# Patient Record
Sex: Female | Born: 1960 | Race: White | Hispanic: No | Marital: Single | Smoking: Former smoker
Health system: Southern US, Community
[De-identification: ages and names within clinical notes are randomized; demographics above are authoritative.]

## PROBLEM LIST (undated history)

## (undated) DIAGNOSIS — Z972 Presence of dental prosthetic device (complete) (partial): Secondary | ICD-10-CM

## (undated) DIAGNOSIS — K219 Gastro-esophageal reflux disease without esophagitis: Secondary | ICD-10-CM

## (undated) DIAGNOSIS — J449 Chronic obstructive pulmonary disease, unspecified: Secondary | ICD-10-CM

## (undated) DIAGNOSIS — Z9289 Personal history of other medical treatment: Secondary | ICD-10-CM

## (undated) DIAGNOSIS — R079 Chest pain, unspecified: Secondary | ICD-10-CM

## (undated) DIAGNOSIS — M810 Age-related osteoporosis without current pathological fracture: Secondary | ICD-10-CM

## (undated) DIAGNOSIS — M1990 Unspecified osteoarthritis, unspecified site: Secondary | ICD-10-CM

## (undated) DIAGNOSIS — J45909 Unspecified asthma, uncomplicated: Secondary | ICD-10-CM

## (undated) DIAGNOSIS — I480 Paroxysmal atrial fibrillation: Secondary | ICD-10-CM

## (undated) DIAGNOSIS — F1090 Alcohol use, unspecified, uncomplicated: Secondary | ICD-10-CM

## (undated) DIAGNOSIS — Z789 Other specified health status: Secondary | ICD-10-CM

## (undated) DIAGNOSIS — J439 Emphysema, unspecified: Secondary | ICD-10-CM

## (undated) HISTORY — DX: Age-related osteoporosis without current pathological fracture: M81.0

## (undated) HISTORY — PX: MULTIPLE TOOTH EXTRACTIONS: SHX2053

## (undated) HISTORY — DX: Personal history of other medical treatment: Z92.89

## (undated) HISTORY — DX: Chest pain, unspecified: R07.9

## (undated) HISTORY — DX: Paroxysmal atrial fibrillation: I48.0

---

## 2005-05-01 ENCOUNTER — Emergency Department: Payer: Self-pay | Admitting: Emergency Medicine

## 2006-11-24 ENCOUNTER — Emergency Department: Payer: Self-pay | Admitting: Internal Medicine

## 2007-04-09 ENCOUNTER — Emergency Department: Payer: Self-pay | Admitting: Emergency Medicine

## 2009-07-07 ENCOUNTER — Emergency Department: Payer: Self-pay | Admitting: Emergency Medicine

## 2010-07-01 ENCOUNTER — Emergency Department: Payer: Self-pay | Admitting: Emergency Medicine

## 2010-10-03 ENCOUNTER — Emergency Department: Payer: Self-pay | Admitting: Emergency Medicine

## 2013-02-11 ENCOUNTER — Emergency Department: Payer: Self-pay | Admitting: Internal Medicine

## 2013-03-07 ENCOUNTER — Emergency Department: Payer: Self-pay | Admitting: Emergency Medicine

## 2013-10-29 ENCOUNTER — Emergency Department: Payer: Self-pay | Admitting: Emergency Medicine

## 2014-03-14 ENCOUNTER — Emergency Department: Payer: Self-pay | Admitting: Emergency Medicine

## 2014-03-14 LAB — CBC WITH DIFFERENTIAL/PLATELET
BASOS PCT: 2.3 %
Basophil #: 0.1 10*3/uL (ref 0.0–0.1)
Eosinophil #: 0.3 10*3/uL (ref 0.0–0.7)
Eosinophil %: 6.7 %
HCT: 40.9 % (ref 35.0–47.0)
HGB: 13.5 g/dL (ref 12.0–16.0)
Lymphocyte #: 1.7 10*3/uL (ref 1.0–3.6)
Lymphocyte %: 34.4 %
MCH: 31.8 pg (ref 26.0–34.0)
MCHC: 33 g/dL (ref 32.0–36.0)
MCV: 96 fL (ref 80–100)
Monocyte #: 0.5 x10 3/mm (ref 0.2–0.9)
Monocyte %: 9 %
NEUTROS PCT: 47.6 %
Neutrophil #: 2.4 10*3/uL (ref 1.4–6.5)
PLATELETS: 224 10*3/uL (ref 150–440)
RBC: 4.24 10*6/uL (ref 3.80–5.20)
RDW: 11.9 % (ref 11.5–14.5)
WBC: 5 10*3/uL (ref 3.6–11.0)

## 2014-03-14 LAB — COMPREHENSIVE METABOLIC PANEL
ALK PHOS: 129 U/L — AB
ALT: 30 U/L
ANION GAP: 7 (ref 7–16)
Albumin: 3.9 g/dL (ref 3.4–5.0)
BUN: 11 mg/dL (ref 7–18)
Bilirubin,Total: 0.3 mg/dL (ref 0.2–1.0)
CHLORIDE: 109 mmol/L — AB (ref 98–107)
CO2: 26 mmol/L (ref 21–32)
Calcium, Total: 8.9 mg/dL (ref 8.5–10.1)
Creatinine: 0.45 mg/dL — ABNORMAL LOW (ref 0.60–1.30)
Glucose: 75 mg/dL (ref 65–99)
Osmolality: 281 (ref 275–301)
POTASSIUM: 4.1 mmol/L (ref 3.5–5.1)
SGOT(AST): 34 U/L (ref 15–37)
SODIUM: 142 mmol/L (ref 136–145)
Total Protein: 7 g/dL (ref 6.4–8.2)

## 2014-03-14 LAB — URINALYSIS, COMPLETE
BACTERIA: NONE SEEN
Bilirubin,UR: NEGATIVE
GLUCOSE, UR: NEGATIVE mg/dL (ref 0–75)
KETONE: NEGATIVE
Nitrite: NEGATIVE
PH: 5 (ref 4.5–8.0)
Protein: NEGATIVE
RBC,UR: 29 /HPF (ref 0–5)
SPECIFIC GRAVITY: 1.013 (ref 1.003–1.030)

## 2015-03-06 ENCOUNTER — Encounter: Payer: Self-pay | Admitting: Emergency Medicine

## 2015-03-06 ENCOUNTER — Emergency Department
Admission: EM | Admit: 2015-03-06 | Discharge: 2015-03-06 | Disposition: A | Discharge status: Home / Self Care | Payer: Self-pay | Attending: Emergency Medicine | Admitting: Emergency Medicine

## 2015-03-06 DIAGNOSIS — R112 Nausea with vomiting, unspecified: Secondary | ICD-10-CM | POA: Insufficient documentation

## 2015-03-06 DIAGNOSIS — R1013 Epigastric pain: Secondary | ICD-10-CM | POA: Insufficient documentation

## 2015-03-06 DIAGNOSIS — Z87891 Personal history of nicotine dependence: Secondary | ICD-10-CM | POA: Insufficient documentation

## 2015-03-06 DIAGNOSIS — R42 Dizziness and giddiness: Secondary | ICD-10-CM | POA: Insufficient documentation

## 2015-03-06 HISTORY — DX: Unspecified asthma, uncomplicated: J45.909

## 2015-03-06 LAB — CBC
HCT: 39.9 % (ref 35.0–47.0)
HEMOGLOBIN: 13.7 g/dL (ref 12.0–16.0)
MCH: 32.7 pg (ref 26.0–34.0)
MCHC: 34.5 g/dL (ref 32.0–36.0)
MCV: 94.8 fL (ref 80.0–100.0)
PLATELETS: 272 10*3/uL (ref 150–440)
RBC: 4.21 MIL/uL (ref 3.80–5.20)
RDW: 12.2 % (ref 11.5–14.5)
WBC: 10.9 10*3/uL (ref 3.6–11.0)

## 2015-03-06 LAB — URINALYSIS COMPLETE WITH MICROSCOPIC (ARMC ONLY)
BILIRUBIN URINE: NEGATIVE
Bacteria, UA: NONE SEEN
Glucose, UA: NEGATIVE mg/dL
Hgb urine dipstick: NEGATIVE
KETONES UR: NEGATIVE mg/dL
LEUKOCYTES UA: NEGATIVE
NITRITE: NEGATIVE
PH: 5 (ref 5.0–8.0)
Protein, ur: NEGATIVE mg/dL
RBC / HPF: NONE SEEN RBC/hpf (ref 0–5)
SPECIFIC GRAVITY, URINE: 1.005 (ref 1.005–1.030)
Squamous Epithelial / LPF: NONE SEEN
WBC UA: NONE SEEN WBC/hpf (ref 0–5)

## 2015-03-06 LAB — LIPASE, BLOOD: Lipase: 26 U/L (ref 22–51)

## 2015-03-06 LAB — COMPREHENSIVE METABOLIC PANEL
ALK PHOS: 114 U/L (ref 38–126)
ALT: 22 U/L (ref 14–54)
ANION GAP: 10 (ref 5–15)
AST: 22 U/L (ref 15–41)
Albumin: 4 g/dL (ref 3.5–5.0)
BILIRUBIN TOTAL: 0.3 mg/dL (ref 0.3–1.2)
BUN: 17 mg/dL (ref 6–20)
CALCIUM: 9 mg/dL (ref 8.9–10.3)
CO2: 26 mmol/L (ref 22–32)
CREATININE: 0.59 mg/dL (ref 0.44–1.00)
Chloride: 101 mmol/L (ref 101–111)
Glucose, Bld: 110 mg/dL — ABNORMAL HIGH (ref 65–99)
Potassium: 3.8 mmol/L (ref 3.5–5.1)
SODIUM: 137 mmol/L (ref 135–145)
TOTAL PROTEIN: 6.7 g/dL (ref 6.5–8.1)

## 2015-03-06 LAB — TROPONIN I: Troponin I: <0.03 ng/mL (ref <0.031)

## 2015-03-06 MED ORDER — RANITIDINE HCL 150 MG PO TABS: 150.0000 mg | ORAL_TABLET | Freq: Every day | ORAL | Status: DC

## 2015-03-06 MED ORDER — GI COCKTAIL ~~LOC~~
30.0000 mL | Freq: Once | ORAL | Status: AC
  Administered 2015-03-06: 30 mL via ORAL
  Filled 2015-03-06: qty 30

## 2015-03-06 MED ORDER — ONDANSETRON HCL 4 MG PO TABS: 4.0000 mg | ORAL_TABLET | Freq: Every day | ORAL | Status: DC | PRN

## 2015-03-06 MED ORDER — FAMOTIDINE 20 MG PO TABS
20.0000 mg | ORAL_TABLET | Freq: Once | ORAL | Status: AC
  Administered 2015-03-06: 20 mg via ORAL
  Filled 2015-03-06: qty 1

## 2015-03-06 NOTE — ED Provider Notes (Addendum)
[facility] Emergency Department Provider Note     Time seen: ----------------------------------------- 2:41 PM on 03/06/2015 -----------------------------------------    I have reviewed the triage vital signs and the nursing notes.   HISTORY  Chief Complaint Hypertension    HPI Jill King is a 54 y.o. female who presents ER stating she was feeling lightheaded having upper abdominal pain and vomiting this morning.Patient had sudden onset of vomiting while she was at work, had upper abdominal pain and felt lightheaded. Blood pressure was noted to be elevated, was encouraged to come to ER by EMS.   Past Medical History  Diagnosis Date  . Asthma     There are no active problems to display for this patient.   Past Surgical History  Procedure Laterality Date  . Cesarean section      Allergies Review of patient's allergies indicates no known allergies.  Social History Social History  Substance Use Topics  . Smoking status: Former Research scientist (life sciences)  . Smokeless tobacco: None  . Alcohol Use: Yes    Review of Systems Constitutional: Negative for fever. Eyes: Negative for visual changes. ENT: Negative for sore throat. Cardiovascular: Negative for chest pain. Respiratory: Negative for shortness of breath. Gastrointestinal: Positive for abdominal pain and vomiting, negative for diarrhea Genitourinary: Negative for dysuria. Musculoskeletal: Negative for back pain. Skin: Negative for rash. Neurological: Negative for headaches, focal weakness or numbness.  10-point ROS otherwise negative.  ____________________________________________   PHYSICAL EXAM:  VITAL SIGNS: ED Triage Vitals  Enc Vitals Group     BP 03/06/15 1151 151/84 mmHg     Pulse Rate 03/06/15 1151 67     Resp 03/06/15 1151 16     Temp 03/06/15 1151 98 F (36.7 C)     Temp Source 03/06/15 1151 Oral     SpO2 03/06/15 1151 100 %     Weight 03/06/15 1151 115 lb (52.164 kg)   Height 03/06/15 1151 5\' 5"  (1.651 m)     Head Cir --      Peak Flow --      Pain Score 03/06/15 1152 7     Pain Loc --      Pain Edu? --      Excl. in [place]? --     Constitutional: Alert and oriented. Well appearing and in no distress. Eyes: Conjunctivae are normal. PERRL. Normal extraocular movements. ENT   Head: Normocephalic and atraumatic.   Nose: No congestion/rhinnorhea.   Mouth/Throat: Mucous membranes are moist.   Neck: No stridor. Cardiovascular: Normal rate, regular rhythm. Normal and symmetric distal pulses are present in all extremities. No murmurs, rubs, or gallops. Respiratory: Normal respiratory effort without tachypnea nor retractions. Breath sounds are clear and equal bilaterally. No wheezes/rales/rhonchi. Gastrointestinal: Soft and nontender. No distention. No abdominal bruits.  Musculoskeletal: Nontender with normal range of motion in all extremities. No joint effusions.  No lower extremity tenderness nor edema. Neurologic:  Normal speech and language. No gross focal neurologic deficits are appreciated. Speech is normal. No gait instability. Skin:  Skin is warm, dry and intact. No rash noted. Psychiatric: Mood and affect are normal. Speech and behavior are normal. Patient exhibits appropriate insight and judgment. ____________________________________________  EKG: Interpreted by me. Normal sinus rhythm with rate of 63 bpm, normal axis normal intervals. No evidence of hypertrophy or acute infarction.  ____________________________________________  ED COURSE:  Pertinent labs & imaging results that were available during my care of the patient were reviewed by me and considered in my medical decision  making (see chart for details). Patient will need abdominal labs, antacids and reevaluation. ____________________________________________    LABS (pertinent positives/negatives)  Labs Reviewed  COMPREHENSIVE METABOLIC PANEL - Abnormal; Notable for the  following:    Glucose, Bld 110 (*)    All other components within normal limits  LIPASE, BLOOD  CBC  TROPONIN I  URINALYSIS COMPLETEWITH MICROSCOPIC ([facility] ONLY)   ____________________________________________  FINAL ASSESSMENT AND PLAN  Epigastric pain  Plan: Patient with labs and imaging as dictated above. Unclear etiology for symptoms. Likely GERD or gastritis. She is feeling better after medications given here including GI cocktail and Pepcid. She is no longer hypertensive. Labs are unremarkable, she stable for outpatient follow-up   Earleen Newport, MD   Earleen Newport, MD 03/06/15 [place], MD 03/06/15 [phone]

## 2015-03-06 NOTE — ED Notes (Signed)
Patient reports that she was feeling light headed, having upper abdominal pain, and vomiting this morning.  EMS came to her work and reported her BP was up; 910 systolic.  Has not vomited again recently.

## 2015-03-06 NOTE — Discharge Instructions (Signed)
Nausea and Vomiting °Nausea is a sick feeling that often comes before throwing up (vomiting). Vomiting is a reflex where stomach contents come out of your mouth. Vomiting can cause severe loss of body fluids (dehydration). Children and elderly adults can become dehydrated quickly, especially if they also have diarrhea. Nausea and vomiting are symptoms of a condition or disease. It is important to find the cause of your symptoms. °CAUSES  °· Direct irritation of the stomach lining. This irritation can result from increased acid production (gastroesophageal reflux disease), infection, food poisoning, taking certain medicines (such as nonsteroidal anti-inflammatory drugs), alcohol use, or tobacco use. °· Signals from the brain. These signals could be caused by a headache, heat exposure, an inner ear disturbance, increased pressure in the brain from injury, infection, a tumor, or a concussion, pain, emotional stimulus, or metabolic problems. °· An obstruction in the gastrointestinal tract (bowel obstruction). °· Illnesses such as diabetes, hepatitis, gallbladder problems, appendicitis, kidney problems, cancer, sepsis, atypical symptoms of a heart attack, or eating disorders. °· Medical treatments such as chemotherapy and radiation. °· Receiving medicine that makes you sleep (general anesthetic) during surgery. °DIAGNOSIS °Your caregiver may ask for tests to be done if the problems do not improve after a few days. Tests may also be done if symptoms are severe or if the reason for the nausea and vomiting is not clear. Tests may include: °· Urine tests. °· Blood tests. °· Stool tests. °· Cultures (to look for evidence of infection). °· X-rays or other imaging studies. °Test results can help your caregiver make decisions about treatment or the need for additional tests. °TREATMENT °You need to stay well hydrated. Drink frequently but in small amounts. You may wish to drink water, sports drinks, clear broth, or eat frozen  ice pops or gelatin dessert to help stay hydrated. When you eat, eating slowly may help prevent nausea. There are also some antinausea medicines that may help prevent nausea. °HOME CARE INSTRUCTIONS  °· Take all medicine as directed by your caregiver. °· If you do not have an appetite, do not force yourself to eat. However, you must continue to drink fluids. °· If you have an appetite, eat a normal diet unless your caregiver tells you differently. °¨ Eat a variety of complex carbohydrates (rice, wheat, potatoes, bread), lean meats, yogurt, fruits, and vegetables. °¨ Avoid high-fat foods because they are more difficult to digest. °· Drink enough water and fluids to keep your urine clear or pale yellow. °· If you are dehydrated, ask your caregiver for specific rehydration instructions. Signs of dehydration may include: °¨ Severe thirst. °¨ Dry lips and mouth. °¨ Dizziness. °¨ Dark urine. °¨ Decreasing urine frequency and amount. °¨ Confusion. °¨ Rapid breathing or pulse. °SEEK IMMEDIATE MEDICAL CARE IF:  °· You have blood or brown flecks (like coffee grounds) in your vomit. °· You have black or bloody stools. °· You have a severe headache or stiff neck. °· You are confused. °· You have severe abdominal pain. °· You have chest pain or trouble breathing. °· You do not urinate at least once every 8 hours. °· You develop cold or clammy skin. °· You continue to vomit for longer than 24 to 48 hours. °· You have a fever. °MAKE SURE YOU:  °· Understand these instructions. °· Will watch your condition. °· Will get help right away if you are not doing well or get worse. °Document Released: 06/14/2005 Document Revised: 09/06/2011 Document Reviewed: 11/11/2010 °[employer]® Patient Information ©2015 [employer]. This information is not intended   to replace advice given to you by your health care provider. Make sure you discuss any questions you have with your health care provider. Gastroesophageal Reflux Disease,  Adult Gastroesophageal reflux disease (GERD) happens when acid from your stomach flows up into the esophagus. When acid comes in contact with the esophagus, the acid causes soreness (inflammation) in the esophagus. Over time, GERD may create small holes (ulcers) in the lining of the esophagus. CAUSES   Increased body weight. This puts pressure on the stomach, making acid rise from the stomach into the esophagus.  Smoking. This increases acid production in the stomach.  Drinking alcohol. This causes decreased pressure in the lower esophageal sphincter (valve or ring of muscle between the esophagus and stomach), allowing acid from the stomach into the esophagus.  Late evening meals and a full stomach. This increases pressure and acid production in the stomach.  A malformed lower esophageal sphincter. Sometimes, no cause is found. SYMPTOMS   Burning pain in the lower part of the mid-chest behind the breastbone and in the mid-stomach area. This may occur twice a week or more often.  Trouble swallowing.  Sore throat.  Dry cough.  Asthma-like symptoms including chest tightness, shortness of breath, or wheezing. DIAGNOSIS  Your caregiver may be able to diagnose GERD based on your symptoms. In some cases, X-rays and other tests may be done to check for complications or to check the condition of your stomach and esophagus. TREATMENT  Your caregiver may recommend over-the-counter or prescription medicines to help decrease acid production. Ask your caregiver before starting or adding any new medicines.  HOME CARE INSTRUCTIONS   Change the factors that you can control. Ask your caregiver for guidance concerning weight loss, quitting smoking, and alcohol consumption.  Avoid foods and drinks that make your symptoms worse, such as:  Caffeine or alcoholic drinks.  Chocolate.  Peppermint or mint flavorings.  Garlic and onions.  Spicy foods.  Citrus fruits, such as oranges, lemons, or  limes.  Tomato-based foods such as sauce, chili, salsa, and pizza.  Fried and fatty foods.  Avoid lying down for the 3 hours prior to your bedtime or prior to taking a nap.  Eat small, frequent meals instead of large meals.  Wear loose-fitting clothing. Do not wear anything tight around your waist that causes pressure on your stomach.  Raise the head of your bed 6 to 8 inches with wood blocks to help you sleep. Extra pillows will not help.  Only take over-the-counter or prescription medicines for pain, discomfort, or fever as directed by your caregiver.  Do not take aspirin, ibuprofen, or other nonsteroidal anti-inflammatory drugs (NSAIDs). SEEK IMMEDIATE MEDICAL CARE IF:   You have pain in your arms, neck, jaw, teeth, or back.  Your pain increases or changes in intensity or duration.  You develop nausea, vomiting, or sweating (diaphoresis).  You develop shortness of breath, or you faint.  Your vomit is green, yellow, black, or looks like coffee grounds or blood.  Your stool is red, bloody, or black. These symptoms could be signs of other problems, such as heart disease, gastric bleeding, or esophageal bleeding. MAKE SURE YOU:   Understand these instructions.  Will watch your condition.  Will get help right away if you are not doing well or get worse. Document Released: 03/24/2005 Document Revised: 09/06/2011 Document Reviewed: 01/01/2011 [facility] Patient Information 2015 [place], [place]. This information is not intended to replace advice given to you by your health care provider. Make sure you discuss  any questions you have with your health care provider. ° °

## 2015-05-23 ENCOUNTER — Ambulatory Visit
Admission: EM | Admit: 2015-05-23 | Discharge: 2015-05-23 | Disposition: A | Discharge status: Home / Self Care | Payer: Self-pay | Attending: Family Medicine | Admitting: Family Medicine

## 2015-05-23 ENCOUNTER — Ambulatory Visit (INDEPENDENT_AMBULATORY_CARE_PROVIDER_SITE_OTHER): Payer: Self-pay

## 2015-05-23 ENCOUNTER — Encounter: Payer: Self-pay | Admitting: Emergency Medicine

## 2015-05-23 DIAGNOSIS — J441 Chronic obstructive pulmonary disease with (acute) exacerbation: Secondary | ICD-10-CM

## 2015-05-23 DIAGNOSIS — J189 Pneumonia, unspecified organism: Secondary | ICD-10-CM

## 2015-05-23 MED ORDER — LEVOFLOXACIN 500 MG PO TABS: 500.0000 mg | ORAL_TABLET | Freq: Every day | ORAL | Status: DC

## 2015-05-23 MED ORDER — PREDNISONE 10 MG (21) PO TBPK: ORAL_TABLET | ORAL | Status: DC

## 2015-05-23 MED ORDER — HYDROCOD POLST-CPM POLST ER 10-8 MG/5ML PO SUER: 5.0000 mL | Freq: Two times a day (BID) | ORAL | Status: DC | PRN

## 2015-05-23 MED ORDER — ALBUTEROL SULFATE HFA 108 (90 BASE) MCG/ACT IN AERS: 2.0000 | INHALATION_SPRAY | RESPIRATORY_TRACT | Status: DC | PRN

## 2015-05-23 NOTE — ED Provider Notes (Signed)
CSN: [id number]     Arrival date & time 05/23/15  1042 History   First MD Initiated Contact with Patient 05/23/15 1133    Nurses notes were reviewed.  Chief Complaint  Patient presents with  . Cough   Patient reports coughing now for 2 weeks. She states the last few days and cough and brownish mucus. Denies having history of COPD but has had bronchospastic attacks before. 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 Patient states she does not smoke but the reality is that she stop smoking 6 months ago. She has been smoking off and on until then and restarted after stopping for a while recently. She states that what she is coughing up seems like this would be you what a heavy smoker be coughing up. (Consider location/radiation/quality/duration/timing/severity/associated sxs/prior Treatment) Patient is a 54 y.o. female presenting with cough. The history is provided by the patient. No language interpreter was used.  Cough Cough characteristics:  Productive and hoarse Sputum characteristics:  Brown, yellow and rusty Severity:  Moderate Onset quality:  Sudden Duration:  2 weeks Timing:  Constant Progression:  Worsening Chronicity:  New Smoker: yes (Former smoker.)   Context: smoke exposure and upper respiratory infection   Context: not animal exposure, not exposure to allergens, not fumes, not occupational exposure, not sick contacts and not weather changes   Relieved by:  Nothing Ineffective treatments:  None tried Associated symptoms: chest pain, rhinorrhea and shortness of breath   Associated symptoms: no chills, no diaphoresis, no ear fullness, no fever, no headaches, no myalgias, no rash, no sinus congestion, no sore throat, no weight loss and no wheezing   Rhinorrhea:    Rhinorrhea appearance: She's had nasal  congestion about 2 weeks ago earlier and has gotten better while the lungs are gotten worse. Risk factors: recent infection   Risk factors: no chemical exposure and no recent travel     Past Medical History  Diagnosis Date  . Asthma    Past Surgical History  Procedure Laterality Date  . Cesarean section     History reviewed. No pertinent family history. Social History  Substance Use Topics  . Smoking status: Former Research scientist (life sciences)  . Smokeless tobacco: None  . Alcohol Use: Yes   OB History    No data available     Review of Systems  Constitutional: Negative for fever, chills, weight loss and diaphoresis.  HENT: Positive for rhinorrhea. Negative for sore throat.   Respiratory: Positive for cough and shortness of breath. Negative for wheezing.   Cardiovascular: Positive for chest pain.  Musculoskeletal: Negative for myalgias.  Skin: Negative for rash.  Neurological: Negative for headaches.  All other systems reviewed and are negative.   Allergies  Review of patient's allergies indicates no known allergies.  Home Medications   Prior to Admission medications   Medication Sig Start Date End Date Taking? Authorizing Provider  dextromethorphan-guaiFENesin (MUCINEX DM) 30-600 MG 12hr tablet Take 1 tablet by mouth 2 (two) times daily.   Yes Historical Provider, MD  albuterol (PROVENTIL HFA;VENTOLIN HFA) 108 (90 BASE) MCG/ACT inhaler Inhale 2 puffs into the lungs every 4 (four) hours as needed for wheezing or shortness of breath. 05/23/15   Frederich Cha, MD  chlorpheniramine-HYDROcodone [facility] PENNKINETIC ER) 10-8 MG/5ML SUER Take 5 mLs by mouth every 12 (twelve) hours as  needed for cough. 05/23/15   Frederich Cha, MD  levofloxacin (LEVAQUIN) 500 MG tablet Take 1 tablet (500 mg total) by mouth daily. 05/23/15   Frederich Cha, MD  ondansetron (ZOFRAN) 4 MG tablet Take 1 tablet (4 mg total) by mouth daily as needed for nausea or vomiting. 03/06/15   Earleen Newport, MD  predniSONE  (STERAPRED UNI-PAK 21 TAB) 10 MG (21) TBPK tablet Sig 6 tablet day 1, 5 tablets day 2, 4 tablets day 3,,3tablets day 4, 2 tablets day 5, 1 tablet day 6 take all tablets orally 05/23/15   Frederich Cha, MD  ranitidine (ZANTAC) 150 MG tablet Take 1 tablet (150 mg total) by mouth at bedtime. 03/06/15 03/05/16  Earleen Newport, MD   Meds Ordered and Administered this Visit  Medications - No data to display  BP 120/101 mmHg  Pulse 94  Temp(Src) 98.6 F (37 C) (Tympanic)  Resp 18  Ht 5\' 5"  (1.651 m)  Wt 115 lb (52.164 kg)  BMI 19.14 kg/m2  SpO2 97%  LMP  No data found.   Physical Exam  Constitutional: She is oriented to person, place, and time. She appears well-developed and well-nourished.  HENT:  Head: Normocephalic and atraumatic.  Right Ear: External ear normal.  Left Ear: External ear normal.  Neck: Normal range of motion. Neck supple.  Cardiovascular: Normal rate, regular rhythm and normal heart sounds.   Pulmonary/Chest: Effort normal and breath sounds normal. No respiratory distress. She has no wheezes.  Musculoskeletal: Normal range of motion. She exhibits no edema.  Neurological: She is alert and oriented to person, place, and time.  Skin: Skin is warm and dry.  Psychiatric: She has a normal mood and affect.  Vitals reviewed.   ED Course  Procedures (including critical care time)  Labs Review Labs Reviewed - No data to display  Imaging Review Dg Chest 2 View  05/23/2015  CLINICAL DATA:  Cough and fever EXAM: CHEST  2 VIEW COMPARISON:  11/24/2006 FINDINGS: COPD with hyperinflation Right middle lobe airspace disease has developed since the prior study and is most consistent with pneumonia. No pleural effusion. Apical scarring bilaterally.  Heart size and vascularity normal. IMPRESSION: COPD with right middle lobe infiltrate consistent with pneumonia. Electronically Signed   By: Franchot Gallo M.D.   On: 05/23/2015 12:33     Visual Acuity Review  Right Eye Distance:    Left Eye Distance:   Bilateral Distance:    Right Eye Near:   Left Eye Near:    Bilateral Near:         MDM   1. Community acquired pneumonia   2. COPD exacerbation ([place])       Because of the presence of major quadrant pneumonia we'll place on Levaquin 500 mg 1 tablet day also add prednisone because of her pulse ox only 97. Albuterol inhaler Tussionex for cough and a work note for today and tomorrow. Recommend following up with her PCP in V4 weeks for repeat chest x-ray if needed and for reevaluation.     Frederich Cha, MD 05/23/15 [phone]

## 2015-05-23 NOTE — Discharge Instructions (Signed)

## 2015-05-23 NOTE — ED Notes (Signed)
Cough (thick and brown phlegm) bodyaches, fever  for 2 weeks.

## 2015-06-26 ENCOUNTER — Ambulatory Visit
Admission: EM | Admit: 2015-06-26 | Discharge: 2015-06-26 | Disposition: A | Discharge status: Home / Self Care | Payer: Self-pay | Attending: Family Medicine | Admitting: Family Medicine

## 2015-06-26 ENCOUNTER — Ambulatory Visit (INDEPENDENT_AMBULATORY_CARE_PROVIDER_SITE_OTHER): Payer: Self-pay

## 2015-06-26 ENCOUNTER — Ambulatory Visit: Payer: Self-pay

## 2015-06-26 ENCOUNTER — Encounter: Payer: Self-pay | Admitting: *Deleted

## 2015-06-26 DIAGNOSIS — R52 Pain, unspecified: Secondary | ICD-10-CM

## 2015-06-26 DIAGNOSIS — M533 Sacrococcygeal disorders, not elsewhere classified: Secondary | ICD-10-CM

## 2015-06-26 HISTORY — DX: Chronic obstructive pulmonary disease, unspecified: J44.9

## 2015-06-26 MED ORDER — OXYCODONE-ACETAMINOPHEN 5-325 MG PO TABS: 1.0000 | ORAL_TABLET | Freq: Three times a day (TID) | ORAL | Status: DC | PRN

## 2015-06-26 MED ORDER — KETOROLAC TROMETHAMINE 60 MG/2ML IM SOLN
60.0000 mg | Freq: Once | INTRAMUSCULAR | Status: AC
  Administered 2015-06-26: 60 mg via INTRAMUSCULAR

## 2015-06-26 MED ORDER — MELOXICAM 7.5 MG PO TABS: 7.5000 mg | ORAL_TABLET | Freq: Every day | ORAL | Status: DC

## 2015-06-26 NOTE — Discharge Instructions (Signed)
Take medication as prescribed. Apply ice. Avoid stress activity. Stretch well.  Follow-up with her primary care physician or the above as needed. Return to urgent care as needed for new or worsening concerns.  Sacroiliac Joint Dysfunction Sacroiliac joint dysfunction is a condition that causes inflammation on one or both sides of the sacroiliac (SI) joint. The SI joint connects the lower part of the spine (sacrum) with the two upper portions of the pelvis (ilium). This condition causes deep aching or burning pain in the low back. In some cases, the pain may also spread into one or both buttocks or hips or spread down the legs. CAUSES This condition may be caused by:  Pregnancy. During pregnancy, extra stress is put on the SI joints because the pelvis widens.  Injury, such as:  Car accidents.  Sport-related injuries.  Work-related injuries.  Having one leg that is shorter than the other.  Conditions that affect the joints, such as:  Rheumatoid arthritis.  Gout.  Psoriatic arthritis.  Joint infection (septic arthritis). Sometimes, the cause of SI joint dysfunction is not known. SYMPTOMS Symptoms of this condition include:  Aching or burning pain in the lower back. The pain may also spread to other areas, such as:  Buttocks.  Groin.  Thighs and legs.  Muscle spasms in or around the painful areas.  Increased pain when standing, walking, running, stair climbing, bending, or lifting. DIAGNOSIS Your health care provider will do a physical exam and take your medical history. During the exam, the health care provider may move one or both of your legs to different positions to check for pain. Various tests may be done to help verify the diagnosis, including:  Imaging tests to look for other causes of pain. These may include:  MRI.  CT scan.  Bone scan.  Diagnostic injection. A numbing medicine is injected into the SI joint using a needle. If the pain is temporarily  improved or stopped after the injection, this can indicate that SI joint dysfunction is the problem. TREATMENT Treatment may vary depending on the cause and severity of your condition. Treatment options may include:  Applying ice or heat to the lower back area. This can help to reduce pain and muscle spasms.  Medicines to relieve pain or inflammation or to relax the muscles.  Wearing a back brace (sacroiliac brace) to help support the joint while your back is healing.  Physical therapy to increase muscle strength around the joint and flexibility at the joint. This may also involve learning proper body positions and ways of moving to relieve stress on the joint.  Direct manipulation of the SI joint.  Injections of steroid medicine into the joint in order to reduce pain and swelling.  Radiofrequency ablation to burn away nerves that are carrying pain messages from the joint.  Use of a device that provides electrical stimulation in order to reduce pain at the joint.  Surgery to put in screws and plates that limit or prevent joint motion. This is rare. HOME CARE INSTRUCTIONS  Rest as needed. Limit your activities as directed by your health care provider.  Take medicines only as directed by your health care provider.  If directed, apply ice to the affected area:  Put ice in a plastic bag.  Place a towel between your skin and the bag.  Leave the ice on for 20 minutes, 2-3 times per day.  Use a heating pad or a moist heat pack as directed by your health care provider.  Exercise  as directed by your health care provider or physical therapist.  Keep all follow-up visits as directed by your health care provider. This is important. SEEK MEDICAL CARE IF:  Your pain is not controlled with medicine.  You have a fever.  You have increasingly severe pain. SEEK IMMEDIATE MEDICAL CARE IF:  You have weakness, numbness, or tingling in your legs or feet.  You lose control of your bladder  or bowel.   This information is not intended to replace advice given to you by your health care provider. Make sure you discuss any questions you have with your health care provider.   Document Released: 09/10/2008 Document Revised: 10/29/2014 Document Reviewed: 02/19/2014 Elsevier Interactive Patient Education 2016 [employer]  Musculoskeletal Pain Musculoskeletal pain is muscle and boney aches and pains. These pains can occur in any part of the body. Your caregiver may treat you without knowing the cause of the pain. They may treat you if blood or urine tests, X-rays, and other tests were normal.  CAUSES There is often not a definite cause or reason for these pains. These pains may be caused by a type of germ (virus). The discomfort may also come from overuse. Overuse includes working out too hard when your body is not fit. Boney aches also come from weather changes. Bone is sensitive to atmospheric pressure changes. HOME CARE INSTRUCTIONS   Ask when your test results will be ready. Make sure you get your test results.  Only take over-the-counter or prescription medicines for pain, discomfort, or fever as directed by your caregiver. If you were given medications for your condition, do not drive, operate machinery or power tools, or sign legal documents for 24 hours. Do not drink alcohol. Do not take sleeping pills or other medications that may interfere with treatment.  Continue all activities unless the activities cause more pain. When the pain lessens, slowly resume normal activities. Gradually increase the intensity and duration of the activities or exercise.  During periods of severe pain, bed rest may be helpful. Lay or sit in any position that is comfortable.  Putting ice on the injured area.  Put ice in a bag.  Place a towel between your skin and the bag.  Leave the ice on for 15 to 20 minutes, 3 to 4 times a day.  Follow up with your caregiver for continued problems and no  reason can be found for the pain. If the pain becomes worse or does not go away, it may be necessary to repeat tests or do additional testing. Your caregiver may need to look further for a possible cause. SEEK IMMEDIATE MEDICAL CARE IF:  You have pain that is getting worse and is not relieved by medications.  You develop chest pain that is associated with shortness or breath, sweating, feeling sick to your stomach (nauseous), or throw up (vomit).  Your pain becomes localized to the abdomen.  You develop any new symptoms that seem different or that concern you. MAKE SURE YOU:   Understand these instructions.  Will watch your condition.  Will get help right away if you are not doing well or get worse.   This information is not intended to replace advice given to you by your health care provider. Make sure you discuss any questions you have with your health care provider.   Document Released: 06/14/2005 Document Revised: 09/06/2011 Document Reviewed: 02/16/2013 Elsevier Interactive Patient Education [employer].

## 2015-06-26 NOTE — ED Provider Notes (Signed)
[facility]  ____________________________________________  Time seen: Approximately 1:00 PM  I have reviewed the triage vital signs and the nursing notes.   HISTORY  Chief Complaint Back Pain   HPI Jill King is a 54 y.o. female presents with complaint of low back pain 1 day. Patient reports that yesterday approximately 4 PM she was at her home and she was cleaning. Patient states that she had already moved one side of the coffee table onto the rug and reports that she bent over briefly to lift the edge of the coffee table up over the rug and states that she heard a pop and had onset of low back pain. Patient states that the coffee table was very light which surprised her why this caused her pain. Denies fall to the ground. Denies head injury or loss consciousness. Denies direct trauma or injury.  Patient states that pain is primarily with movement. Denies pain radiation. Denies numbness or tingling sensations. Denies urinary or bowel retention or incontinence. States that once she is standing upright the pain is minimal. States the pain is primarily with movement and changing positions. Patient states that current pain is 6 out of 10 aching but reports intermittent catching pain with movements.  Denies other pain or injuries. Denies recent sickness. Denies fevers. Denies abdominal pain, chest pain, shortness of breath, dizziness, weakness, dysuria.    Past Medical History  Diagnosis Date  . Asthma   . COPD (chronic obstructive pulmonary disease) ([facility])     There are no active problems to display for this patient.   Past Surgical History  Procedure Laterality Date  . Cesarean section      Current Outpatient Rx  Name  Route  Sig  Dispense  Refill  . albuterol (PROVENTIL HFA;VENTOLIN HFA) 108 (90 BASE) MCG/ACT inhaler   Inhalation   Inhale 2 puffs into the lungs every 4 (four) hours as needed for wheezing or shortness of breath.   1 Inhaler   1   .           .            .           .           .           .            Reports she is postmenopausal.   Allergies Review of patient's allergies indicates no known allergies.  History reviewed. No pertinent family history.  Social History Social History  Substance Use Topics  . Smoking status: Former Research scientist (life sciences)  . Smokeless tobacco: Never Used  . Alcohol Use: Yes    Review of Systems Constitutional: No fever/chills Eyes: No visual changes. ENT: No sore throat. Cardiovascular: Denies chest pain. Respiratory: Denies shortness of breath. Gastrointestinal: No abdominal pain.  No nausea, no vomiting.  No diarrhea.  No constipation. Genitourinary: Negative for dysuria. Musculoskeletal: Positive for back pain. Skin: Negative for rash. Neurological: Negative for headaches, focal weakness or numbness.  10-point ROS otherwise negative.  ____________________________________________   PHYSICAL EXAM:  VITAL SIGNS: ED Triage Vitals  Enc Vitals Group     BP 06/26/15 1146 155/89 mmHg     Pulse Rate 06/26/15 1146 83     Resp 06/26/15 1146 20     Temp 06/26/15 1146 98.2 F (36.8 C)     Temp Source 06/26/15 1146 Oral     SpO2 06/26/15 1146 97 %  Weight 06/26/15 1146 112 lb (50.803 kg)     Height 06/26/15 1146 5\' 4"  (1.626 m)     Head Cir --      Peak Flow --      Pain Score 06/26/15 1149 8     Pain Loc --      Pain Edu? --      Excl. in [place]? --     Constitutional: Alert and oriented. Well appearing and in no acute distress. Eyes: Conjunctivae are normal. PERRL. EOMI. Head: Atraumatic.  Nose: No congestion/rhinnorhea.  Mouth/Throat: Mucous membranes are moist.  Oropharynx non-erythematous. Neck: No stridor.  No cervical spine tenderness to palpation. Hematological/Lymphatic/Immunilogical: No cervical lymphadenopathy. Cardiovascular: Normal rate, regular rhythm. Grossly normal heart sounds.  Good peripheral circulation. Respiratory: Normal respiratory effort.  No retractions. Lungs  CTAB. Gastrointestinal: Soft and nontender. Musculoskeletal: No lower or upper extremity tenderness nor edema. Bilateral pedal pulses equal and easily palpated. No midline cervical, thoracic or lumbar tenderness to palpation. Bilateral straight leg raise test negative. 5 out of 5 strength to bilateral upper and lower extremities.  No saddle anesthesia. Steady gait. Bilateral plantar flexion and dorsiflexion equal and strong. Patient with point moderate tenderness at the sacroiliac joints , no swelling or ecchymosis, no other tenderness to palpation. Changes positions quickly without distress. No lumbar or coccyx tenderness.  Neurologic:  Normal speech and language. No gross focal neurologic deficits are appreciated. No gait instability. Skin:  Skin is warm, dry and intact. No rash noted. Psychiatric: Mood and affect are normal. Speech and behavior are normal.  ____________________________________________   LABS (all labs ordered are listed, but only abnormal results are displayed)  Labs Reviewed - No data to display  RADIOLOGY  BILATERAL SACROILIAC JOINTS - 3+ VIEW  COMPARISON: CT 03/14/2014  FINDINGS: SI joints are symmetric and unremarkable. Early spurring in the hip joints. Joint spaces are relatively maintained. No acute bony abnormality. Specifically, no fracture, subluxation, or dislocation. Soft tissues are intact.  IMPRESSION: No acute bony abnormality.   Electronically Signed By: Rolm Baptise M.D. On: 06/26/2015 13:24  I, Marylene Land, personally viewed and evaluated these images (plain radiographs) as part of my medical decision making, as well as reviewing the written report by the radiologist.   [place] / [place] / ED COURSE  Pertinent labs & imaging results that were available during my care of the patient were reviewed by me and considered in my medical decision making (see chart for details).  Very well-appearing patient. No  acute distress. Presents for complaint of low back pain 1 day after injury at home. Denies direct injury or fall. Denies head injury or loss consciousness. Ambulatory in room a steady gait. No focal neurological deficits. No midline cervical, thoracic or lumbar tenderness to palpation. Patient with tenderness at SI joints. SI joint x-ray per radiologist no acute bony abnormality. 60 mg IM Toradol 1.  Post Toradol patient reports feeling improved. Changes positions quickly without distress. Suspect strain injury. Will treat with daily Mobic as well as when necessary Percocet. Encouraged ice, rest, stretching. Follow-up with PCP.  Discussed follow up with Primary care physician this week. Discussed follow up and return parameters including no resolution or any worsening concerns. Patient verbalized understanding and agreed to plan.   ____________________________________________   FINAL CLINICAL IMPRESSION(S) / ED DIAGNOSES  Final diagnoses:  Pain  Sacroiliac joint pain       Marylene Land, NP 06/26/15 1434

## 2015-06-26 NOTE — ED Notes (Signed)
Patient hurt back yesterday afternoon while lifting a coffee table. Patient reports that she heard a pop sound in her lower back immediately followed by pain.

## 2015-11-30 ENCOUNTER — Encounter: Payer: Self-pay | Admitting: Gynecology

## 2015-11-30 ENCOUNTER — Ambulatory Visit
Admission: EM | Admit: 2015-11-30 | Discharge: 2015-11-30 | Disposition: A | Discharge status: Home / Self Care | Payer: Self-pay | Attending: Family Medicine | Admitting: Family Medicine

## 2015-11-30 DIAGNOSIS — R1310 Dysphagia, unspecified: Secondary | ICD-10-CM

## 2015-11-30 DIAGNOSIS — K219 Gastro-esophageal reflux disease without esophagitis: Secondary | ICD-10-CM

## 2015-11-30 HISTORY — DX: Gastro-esophageal reflux disease without esophagitis: K21.9

## 2015-11-30 MED ORDER — OMEPRAZOLE 20 MG PO CPDR: 20.0000 mg | DELAYED_RELEASE_CAPSULE | Freq: Every day | ORAL | Status: DC

## 2015-11-30 NOTE — ED Provider Notes (Signed)
CSN: [id number]     Arrival date & time 11/30/15  1119 History   First MD Initiated Contact with Patient 11/30/15 1257     Chief Complaint  Patient presents with  . Emesis   HPI  Jill King is a pleasant 55 y.o. female who presents with acid reflux and dysphasia. She tells me she has been having severe heartburn hernia or a dull light. At times she has regurgitation of foamy white it. She has pressure and points to her epigastrium. It is worse with certain foods like tomato-based products. She has dysphasia as well as feels like food gets stuck at her lower esophagus. At times she has regurgitation. She has no nausea and has had intermittent vomiting over the past 3 months about 4 times. She has been on Zantac 150 mg daily for several years now feels like it may not be working so she recently changed to Pepcid complete and has taken 2 doses of that without any significant change. She has never been on a PPI. Pain 0/10 currently.  Past Medical History  Diagnosis Date  . Asthma   . COPD (chronic obstructive pulmonary disease) ([place])   . GERD (gastroesophageal reflux disease)    Past Surgical History  Procedure Laterality Date  . Cesarean section     History reviewed. No pertinent family history. Social History  Substance Use Topics  . Smoking status: Former Research scientist (life sciences)  . Smokeless tobacco: Never Used  . Alcohol Use: Yes   OB History    No data available     Review of Systems  Constitutional: Negative.   HENT: Negative.   Eyes: Negative.   Respiratory: Negative.   Cardiovascular: Negative.   Endocrine: Negative.   Genitourinary: Negative.   Musculoskeletal: Negative.   Skin: Negative.   Neurological: Negative.   Hematological: Negative.   Psychiatric/Behavioral: Negative.     Allergies  Review of patient's allergies indicates no known allergies.  Home Medications   No current facility-administered medications for this encounter.   Current Outpatient Prescriptions   Medication Sig Dispense Refill  . ranitidine (ZANTAC) 150 MG tablet Take 1 tablet (150 mg total) by mouth at bedtime. 30 tablet 1  . albuterol (PROVENTIL HFA;VENTOLIN HFA) 108 (90 BASE) MCG/ACT inhaler Inhale 2 puffs into the lungs every 4 (four) hours as needed for wheezing or shortness of breath. 1 Inhaler 1  . chlorpheniramine-HYDROcodone (TUSSIONEX PENNKINETIC ER) 10-8 MG/5ML SUER Take 5 mLs by mouth every 12 (twelve) hours as needed for cough. 115 mL 0  . dextromethorphan-guaiFENesin (MUCINEX DM) 30-600 MG 12hr tablet Take 1 tablet by mouth 2 (two) times daily.    Marland Kitchen levofloxacin (LEVAQUIN) 500 MG tablet Take 1 tablet (500 mg total) by mouth daily. 10 tablet 0  . meloxicam (MOBIC) 7.5 MG tablet Take 1 tablet (7.5 mg total) by mouth daily. 10 tablet 0  . ondansetron (ZOFRAN) 4 MG tablet Take 1 tablet (4 mg total) by mouth daily as needed for nausea or vomiting. 20 tablet 1  . oxyCODONE-acetaminophen (ROXICET) 5-325 MG tablet Take 1 tablet by mouth every 8 (eight) hours as needed for moderate pain or severe pain (Do not drive or operate heavy machinery while taking as can cause drowsiness.). 9 tablet 0  . predniSONE (STERAPRED UNI-PAK 21 TAB) 10 MG (21) TBPK tablet Sig 6 tablet day 1, 5 tablets day 2, 4 tablets day 3,,3tablets day 4, 2 tablets day 5, 1 tablet day 6 take all tablets orally 21 tablet 0    Meds  Ordered and Administered this Visit  Medications - No data to display  BP 150/76 mmHg  Pulse 75  Temp(Src) 98.2 F (36.8 C) (Oral)  Resp 16  Ht 5\' 5"  (1.651 m)  Wt 120 lb (54.432 kg)  BMI 19.97 kg/m2  SpO2 99% No data found.   Physical Exam  Constitutional: She is oriented to person, place, and time. She appears well-developed and well-nourished. No distress.  HENT:  Head: Normocephalic and atraumatic.  Eyes: Conjunctivae are normal. No scleral icterus.  Neck: Normal range of motion.  Cardiovascular: Normal rate and regular rhythm.   Pulmonary/Chest: Effort normal and breath  sounds normal. No respiratory distress.  Abdominal: Soft. Normal appearance, normal aorta and bowel sounds are normal. She exhibits no distension and no mass. There is no hepatosplenomegaly. There is tenderness in the epigastric area. There is no rigidity, no rebound, no guarding, no tenderness at McBurney's point and negative Murphy's sign. No hernia.  Musculoskeletal: Normal range of motion. She exhibits no edema or tenderness.  Neurological: She is alert and oriented to person, place, and time. No cranial nerve deficit.  Skin: Skin is warm and dry. No rash noted. No erythema.  Psychiatric: Her behavior is normal. Judgment normal.  Nursing note and vitals reviewed.   ED Course  Procedures NA  Labs Review Labs Reviewed - No data to display  Imaging Review No results found.  MDM   1. Gastroesophageal reflux disease, esophagitis presence not specified   2. Dysphagia    Plan: Diagnosis reviewed with patient Discontinue Zantac & Pepcid Rx as per orders;  benefits, risks, potential side effects reviewed  Recommend supportive treatment with GERD DIET Follow-up with gastroenterologist for EGD with possible esophageal dilation for dysphagia and screening colonoscopy     Andria Meuse, NP 11/30/15 1318

## 2015-11-30 NOTE — Discharge Instructions (Signed)
Dysphagia Swallowing problems (dysphagia) occur when solids and liquids seem to stick in your throat on the way down to your stomach, or the food takes longer to get to the stomach. Other symptoms include regurgitating food, noises coming from the throat, chest discomfort with swallowing, and a feeling of fullness or the feeling of something being stuck in your throat when swallowing. When blockage in your throat is complete, it may be associated with drooling. CAUSES  Problems with swallowing may occur because of problems with the muscles. The food cannot be propelled in the usual manner into your stomach. You may have ulcers, scar tissue, or inflammation in the tube down which food travels from your mouth to your stomach (esophagus), which blocks food from passing normally into the stomach. Causes of inflammation include:  Acid reflux from your stomach into your esophagus.  Infection.  Radiation treatment for cancer.  Medicines taken without enough fluids to wash them down into your stomach. You may have nerve problems that prevent signals from being sent to the muscles of your esophagus to contract and move your food down to your stomach. Globus pharyngeus is a relatively common problem in which there is a sense of an obstruction or difficulty in swallowing, without any physical abnormalities of the swallowing passages being found. This problem usually improves over time with reassurance and testing to rule out other causes. DIAGNOSIS Dysphagia can be diagnosed and its cause can be determined by tests in which you swallow a white substance that helps illuminate the inside of your throat (contrast medium) while X-rays are taken. Sometimes a flexible telescope that is inserted down your throat (endoscopy) to look at your esophagus and stomach is used. TREATMENT   If the dysphagia is caused by acid reflux or infection, medicines may be used.  If the dysphagia is caused by problems with your  swallowing muscles, swallowing therapy may be used to help you strengthen your swallowing muscles.  If the dysphagia is caused by a blockage or mass, procedures to remove the blockage may be done. HOME CARE INSTRUCTIONS  Try to eat soft food that is easier to swallow and check your weight on a daily basis to be sure that it is not decreasing.  Be sure to drink liquids when sitting upright (not lying down). SEEK MEDICAL CARE IF:  You are losing weight because you are unable to swallow.  You are coughing when you drink liquids (aspiration).  You are coughing up partially digested food. SEEK IMMEDIATE MEDICAL CARE IF:  You are unable to swallow your own saliva .  You are having shortness of breath or a fever, or both.  You have a hoarse voice along with difficulty swallowing. MAKE SURE YOU:  Understand these instructions.  Will watch your condition.  Will get help right away if you are not doing well or get worse.   This information is not intended to replace advice given to you by your health care provider. Make sure you discuss any questions you have with your health care provider.   Document Released: 06/11/2000 Document Revised: 07/05/2014 Document Reviewed: 12/01/2012 Elsevier Interactive Patient Education 2016 [place]. Gastroesophageal Reflux Disease, Adult Normally, food travels down the esophagus and stays in the stomach to be digested. However, when a person has gastroesophageal reflux disease (GERD), food and stomach acid move back up into the esophagus. When this happens, the esophagus becomes sore and inflamed. Over time, GERD can create small holes (ulcers) in the lining of the esophagus.  CAUSES This condition is caused by a problem with the muscle between the esophagus and the stomach (lower esophageal sphincter, or LES). Normally, the LES muscle closes after food passes through the esophagus to the stomach. When the LES is weakened or abnormal, it does not  close properly, and that allows food and stomach acid to go back up into the esophagus. The LES can be weakened by certain dietary substances, medicines, and medical conditions, including:  Tobacco use.  Pregnancy.  Having a hiatal hernia.  Heavy alcohol use.  Certain foods and beverages, such as coffee, chocolate, onions, and peppermint. RISK FACTORS This condition is more likely to develop in:  People who have an increased body weight.  People who have connective tissue disorders.  People who use NSAID medicines. SYMPTOMS Symptoms of this condition include:  Heartburn.  Difficult or painful swallowing.  The feeling of having a lump in the throat.  Abitter taste in the mouth.  Bad breath.  Having a large amount of saliva.  Having an upset or bloated stomach.  Belching.  Chest pain.  Shortness of breath or wheezing.  Ongoing (chronic) cough or a night-time cough.  Wearing away of tooth enamel.  Weight loss. Different conditions can cause chest pain. Make sure to see your health care provider if you experience chest pain. DIAGNOSIS Your health care provider will take a medical history and perform a physical exam. To determine if you have mild or severe GERD, your health care provider may also monitor how you respond to treatment. You may also have other tests, including:  An endoscopy toexamine your stomach and esophagus with a small camera.  A test thatmeasures the acidity level in your esophagus.  A test thatmeasures how much pressure is on your esophagus.  A barium swallow or modified barium swallow to show the shape, size, and functioning of your esophagus. TREATMENT The goal of treatment is to help relieve your symptoms and to prevent complications. Treatment for this condition may vary depending on how severe your symptoms are. Your health care provider may recommend:  Changes to your diet.  Medicine.  Surgery. HOME CARE  INSTRUCTIONS Diet  Follow a diet as recommended by your health care provider. This may involve avoiding foods and drinks such as:  Coffee and tea (with or without caffeine).  Drinks that containalcohol.  Energy drinks and sports drinks.  Carbonated drinks or sodas.  Chocolate and cocoa.  Peppermint and mint flavorings.  Garlic and onions.  Horseradish.  Spicy and acidic foods, including peppers, chili powder, curry powder, vinegar, hot sauces, and barbecue sauce.  Citrus fruit juices and citrus fruits, such as oranges, lemons, and limes.  Tomato-based foods, such as red sauce, chili, salsa, and pizza with red sauce.  Fried and fatty foods, such as donuts, french fries, potato chips, and high-fat dressings.  High-fat meats, such as hot dogs and fatty cuts of red and white meats, such as rib eye steak, sausage, ham, and bacon.  High-fat dairy items, such as whole milk, butter, and cream cheese.  Eat small, frequent meals instead of large meals.  Avoid drinking large amounts of liquid with your meals.  Avoid eating meals during the 2-3 hours before bedtime.  Avoid lying down right after you eat.  Do not exercise right after you eat. General Instructions  Pay attention to any changes in your symptoms.  Take over-the-counter and prescription medicines only as told by your health care provider. Do not take aspirin, ibuprofen, or other NSAIDs  unless your health care provider told you to do so.  Do not use any tobacco products, including cigarettes, chewing tobacco, and e-cigarettes. If you need help quitting, ask your health care provider.  Wear loose-fitting clothing. Do not wear anything tight around your waist that causes pressure on your abdomen.  Raise (elevate) the head of your bed 6 inches (15cm).  Try to reduce your stress, such as with yoga or meditation. If you need help reducing stress, ask your health care provider.  If you are overweight, reduce your  weight to an amount that is healthy for you. Ask your health care provider for guidance about a safe weight loss goal.  Keep all follow-up visits as told by your health care provider. This is important. SEEK MEDICAL CARE IF:  You have new symptoms.  You have unexplained weight loss.  You have difficulty swallowing, or it hurts to swallow.  You have wheezing or a persistent cough.  Your symptoms do not improve with treatment.  You have a hoarse voice. SEEK IMMEDIATE MEDICAL CARE IF:  You have pain in your arms, neck, jaw, teeth, or back.  You feel sweaty, dizzy, or light-headed.  You have chest pain or shortness of breath.  You vomit and your vomit looks like blood or coffee grounds.  You faint.  Your stool is bloody or black.  You cannot swallow, drink, or eat.   This information is not intended to replace advice given to you by your health care provider. Make sure you discuss any questions you have with your health care provider.   Document Released: 03/24/2005 Document Revised: 03/05/2015 Document Reviewed: 10/09/2014 Elsevier Interactive Patient Education [employer].

## 2015-11-30 NOTE — ED Notes (Signed)
Patient c/o has history of acid reflux. Per patient feel better only when she throw up.

## 2016-01-01 ENCOUNTER — Encounter: Payer: Self-pay | Admitting: Gastroenterology

## 2016-01-01 ENCOUNTER — Ambulatory Visit (INDEPENDENT_AMBULATORY_CARE_PROVIDER_SITE_OTHER): Payer: Self-pay | Admitting: Gastroenterology

## 2016-01-01 VITALS — BP 151/77 | HR 86 | Temp 99.0°F | Ht 65.0 in | Wt 116.0 lb

## 2016-01-01 DIAGNOSIS — J45909 Unspecified asthma, uncomplicated: Secondary | ICD-10-CM | POA: Insufficient documentation

## 2016-01-01 DIAGNOSIS — J449 Chronic obstructive pulmonary disease, unspecified: Secondary | ICD-10-CM | POA: Insufficient documentation

## 2016-01-01 DIAGNOSIS — K219 Gastro-esophageal reflux disease without esophagitis: Secondary | ICD-10-CM | POA: Insufficient documentation

## 2016-01-01 DIAGNOSIS — R1310 Dysphagia, unspecified: Secondary | ICD-10-CM

## 2016-01-01 MED ORDER — PEG 3350-KCL-NABCB-NACL-NASULF 236 G PO SOLR: 4000.0000 mL | Freq: Once | ORAL | Status: DC

## 2016-01-01 NOTE — Progress Notes (Signed)
Gastroenterology Consultation  Referring Provider:     No ref. provider found Primary Care Physician:  No primary care provider on file. Primary Gastroenterologist:  Dr. Allen Norris     Reason for Consultation:     GERD and dysphagia        HPI:   Jill King is a 55 y.o. y/o female referred for consultation & management of GERD and dysphagia by Dr. Rayne Du primary care provider on file..  This patient comes today after being seen in urgent care. The patient has been having long-standing heartburn. The patient states her heartburn has progressed and now she feels like food is sticking in her distal esophagus. The patient has not had any need to vomit the food back up. There is no report of any unexplained weight loss and the patient states she has actually gained some weight. There is no report of any black stools or bloody stools. The patient denies ever having a screening colonoscopy in the past. The patient does not have a primary care provider either.  Past Medical History  Diagnosis Date  . Asthma   . COPD (chronic obstructive pulmonary disease) ([place])   . GERD (gastroesophageal reflux disease)     Past Surgical History  Procedure Laterality Date  . Cesarean section      Prior to Admission medications   Medication Sig Start Date End Date Taking? Authorizing Provider  omeprazole (PRILOSEC) 20 MG capsule Take 1 capsule (20 mg total) by mouth daily. 11/30/15  Yes Andria Meuse, NP  ranitidine (ZANTAC) 150 MG tablet Take 1 tablet (150 mg total) by mouth at bedtime. 03/06/15 03/05/16 Yes Earleen Newport, MD    Family History  Problem Relation Age of Onset  . COPD Mother   . Diabetes Father   . Heart disease Father      Social History  Substance Use Topics  . Smoking status: Former Research scientist (life sciences)  . Smokeless tobacco: Never Used  . Alcohol Use: Yes    Allergies as of 01/01/2016  . (No Known Allergies)    Review of Systems:    All systems reviewed and negative except where noted in  HPI.   Physical Exam:  BP 151/77 mmHg  Pulse 86  Temp(Src) 99 F (37.2 C) (Oral)  Ht 5\' 5"  (1.651 m)  Wt 116 lb (52.617 kg)  BMI 19.30 kg/m2 No LMP recorded. Patient is postmenopausal. Psych:  Alert and cooperative. Normal mood and affect. General:   Alert,  Well-developed, well-nourished, pleasant and cooperative in NAD Head:  Normocephalic and atraumatic. Eyes:  Sclera clear, no icterus.   Conjunctiva pink. Ears:  Normal auditory acuity. Nose:  No deformity, discharge, or lesions. Mouth:  No deformity or lesions,oropharynx pink & moist. Neck:  Supple; no masses or thyromegaly. Lungs:  Respirations even and unlabored.  Clear throughout to auscultation.   No wheezes, crackles, or rhonchi. No acute distress. Heart:  Regular rate and rhythm; no murmurs, clicks, rubs, or gallops. Abdomen:  Normal bowel sounds.  No bruits.  Soft, non-tender and non-distended without masses, hepatosplenomegaly or hernias noted.  No guarding or rebound tenderness.  Negative Carnett sign.   Rectal:  Deferred.  Msk:  Symmetrical without gross deformities.  Good, equal movement & strength bilaterally. Pulses:  Normal pulses noted. Extremities:  No clubbing or edema.  No cyanosis. Neurologic:  Alert and oriented x3;  grossly normal neurologically. Skin:  Intact without significant lesions or rashes.  No jaundice. Lymph Nodes:  No significant cervical adenopathy. Psych:  Alert and cooperative. Normal mood and affect.  Imaging Studies: No results found.  Assessment and Plan:   Jill King is a 55 y.o. y/o female who comes in today with a history of long-standing heartburn who now reports that she is having some dysphagia. The dysphagia is mostly to solids than it is to liquids. The patient will be set up for an EGD to evaluate her for possible Barrett's esophagus versus a esophageal stricture versus a neoplasm. The patient will also be set up for screening colonoscopy because she has never had one. I have  discussed risks & benefits which include, but are not limited to, bleeding, infection, perforation & drug reaction.  The patient agrees with this plan & written consent will be obtained.      Note: This dictation was prepared with Dragon dictation along with smaller phrase technology. Any transcriptional errors that result from this process are unintentional.

## 2016-01-01 NOTE — Patient Instructions (Signed)
We have set-up your Colonoscopy and EGD for 01/08/16 at [facility], please see paperwork provided.

## 2016-01-02 ENCOUNTER — Encounter: Payer: Self-pay | Admitting: *Deleted

## 2016-01-07 NOTE — Discharge Instructions (Signed)

## 2016-01-08 ENCOUNTER — Encounter
Admission: RE | Disposition: A | Discharge status: Home / Self Care | Payer: Self-pay | Source: Ambulatory Visit | Attending: Gastroenterology

## 2016-01-08 ENCOUNTER — Ambulatory Visit: Payer: Self-pay | Admitting: Anesthesiology

## 2016-01-08 ENCOUNTER — Ambulatory Visit
Admission: RE | Admit: 2016-01-08 | Discharge: 2016-01-08 | Disposition: A | Discharge status: Home / Self Care | Payer: Self-pay | Source: Ambulatory Visit | Attending: Gastroenterology | Admitting: Gastroenterology

## 2016-01-08 DIAGNOSIS — M19042 Primary osteoarthritis, left hand: Secondary | ICD-10-CM | POA: Insufficient documentation

## 2016-01-08 DIAGNOSIS — Z9889 Other specified postprocedural states: Secondary | ICD-10-CM | POA: Insufficient documentation

## 2016-01-08 DIAGNOSIS — R1310 Dysphagia, unspecified: Secondary | ICD-10-CM

## 2016-01-08 DIAGNOSIS — Z87891 Personal history of nicotine dependence: Secondary | ICD-10-CM | POA: Insufficient documentation

## 2016-01-08 DIAGNOSIS — Z8249 Family history of ischemic heart disease and other diseases of the circulatory system: Secondary | ICD-10-CM | POA: Insufficient documentation

## 2016-01-08 DIAGNOSIS — Z1211 Encounter for screening for malignant neoplasm of colon: Secondary | ICD-10-CM

## 2016-01-08 DIAGNOSIS — Z79899 Other long term (current) drug therapy: Secondary | ICD-10-CM | POA: Insufficient documentation

## 2016-01-08 DIAGNOSIS — D125 Benign neoplasm of sigmoid colon: Secondary | ICD-10-CM

## 2016-01-08 DIAGNOSIS — Z833 Family history of diabetes mellitus: Secondary | ICD-10-CM | POA: Insufficient documentation

## 2016-01-08 DIAGNOSIS — J449 Chronic obstructive pulmonary disease, unspecified: Secondary | ICD-10-CM | POA: Insufficient documentation

## 2016-01-08 DIAGNOSIS — K210 Gastro-esophageal reflux disease with esophagitis: Secondary | ICD-10-CM | POA: Insufficient documentation

## 2016-01-08 DIAGNOSIS — K5730 Diverticulosis of large intestine without perforation or abscess without bleeding: Secondary | ICD-10-CM | POA: Insufficient documentation

## 2016-01-08 DIAGNOSIS — Z825 Family history of asthma and other chronic lower respiratory diseases: Secondary | ICD-10-CM | POA: Insufficient documentation

## 2016-01-08 DIAGNOSIS — M19041 Primary osteoarthritis, right hand: Secondary | ICD-10-CM | POA: Insufficient documentation

## 2016-01-08 DIAGNOSIS — K635 Polyp of colon: Secondary | ICD-10-CM | POA: Insufficient documentation

## 2016-01-08 HISTORY — PX: ESOPHAGOGASTRODUODENOSCOPY (EGD) WITH PROPOFOL: SHX5813

## 2016-01-08 HISTORY — DX: Presence of dental prosthetic device (complete) (partial): Z97.2

## 2016-01-08 HISTORY — DX: Unspecified osteoarthritis, unspecified site: M19.90

## 2016-01-08 HISTORY — PX: COLONOSCOPY WITH PROPOFOL: SHX5780

## 2016-01-08 HISTORY — PX: POLYPECTOMY: SHX5525

## 2016-01-08 SURGERY — COLONOSCOPY WITH PROPOFOL
Anesthesia: Monitor Anesthesia Care | Wound class: Contaminated

## 2016-01-08 MED ORDER — STERILE WATER FOR IRRIGATION IR SOLN
Status: DC | PRN
  Administered 2016-01-08: 11:00:00

## 2016-01-08 MED ORDER — SODIUM CHLORIDE 0.9 % IV SOLN: INTRAVENOUS | Status: DC

## 2016-01-08 MED ORDER — GLYCOPYRROLATE 0.2 MG/ML IJ SOLN
INTRAMUSCULAR | Status: DC | PRN
  Administered 2016-01-08: 0.1 mg via INTRAVENOUS

## 2016-01-08 MED ORDER — ACETAMINOPHEN 160 MG/5ML PO SOLN: 325.0000 mg | ORAL | Status: DC | PRN

## 2016-01-08 MED ORDER — LIDOCAINE HCL (CARDIAC) 20 MG/ML IV SOLN
INTRAVENOUS | Status: DC | PRN
  Administered 2016-01-08: 50 mg via INTRAVENOUS

## 2016-01-08 MED ORDER — PROPOFOL 10 MG/ML IV BOLUS
INTRAVENOUS | Status: DC | PRN
  Administered 2016-01-08 (×2): 40 mg via INTRAVENOUS
  Administered 2016-01-08: 20 mg via INTRAVENOUS
  Administered 2016-01-08: 40 mg via INTRAVENOUS
  Administered 2016-01-08: 50 mg via INTRAVENOUS
  Administered 2016-01-08: 30 mg via INTRAVENOUS
  Administered 2016-01-08 (×2): 40 mg via INTRAVENOUS
  Administered 2016-01-08: 50 mg via INTRAVENOUS
  Administered 2016-01-08: 30 mg via INTRAVENOUS
  Administered 2016-01-08: 20 mg via INTRAVENOUS
  Administered 2016-01-08: 30 mg via INTRAVENOUS
  Administered 2016-01-08: 40 mg via INTRAVENOUS
  Administered 2016-01-08: 20 mg via INTRAVENOUS
  Administered 2016-01-08: 40 mg via INTRAVENOUS
  Administered 2016-01-08: 50 mg via INTRAVENOUS
  Administered 2016-01-08: 70 mg via INTRAVENOUS
  Administered 2016-01-08: 20 mg via INTRAVENOUS
  Administered 2016-01-08 (×2): 40 mg via INTRAVENOUS

## 2016-01-08 MED ORDER — ONDANSETRON HCL 4 MG/2ML IJ SOLN: 4.0000 mg | Freq: Once | INTRAMUSCULAR | Status: DC | PRN

## 2016-01-08 MED ORDER — ACETAMINOPHEN 325 MG PO TABS: 325.0000 mg | ORAL_TABLET | ORAL | Status: DC | PRN

## 2016-01-08 MED ORDER — LACTATED RINGERS IV SOLN
INTRAVENOUS | Status: DC
  Administered 2016-01-08 (×2): via INTRAVENOUS

## 2016-01-08 SURGICAL SUPPLY — 35 items
BALLN DILATOR 10-12 8 (BALLOONS)
BALLN DILATOR 12-15 8 (BALLOONS)
BALLN DILATOR 15-18 8 (BALLOONS)
BALLN DILATOR CRE 0-12 8 (BALLOONS)
BALLN DILATOR ESOPH 8 10 CRE (MISCELLANEOUS) IMPLANT
BALLOON DILATOR 12-15 8 (BALLOONS) IMPLANT
BALLOON DILATOR 15-18 8 (BALLOONS) IMPLANT
BALLOON DILATOR CRE 0-12 8 (BALLOONS) IMPLANT
BLOCK BITE 60FR ADLT L/F GRN (MISCELLANEOUS) ×4 IMPLANT
CANISTER SUCT 1200ML W/VALVE (MISCELLANEOUS) ×4 IMPLANT
CLIP HMST 235XBRD CATH ROT (MISCELLANEOUS) IMPLANT
CLIP RESOLUTION 360 11X235 (MISCELLANEOUS)
FCP ESCP3.2XJMB 240X2.8X (MISCELLANEOUS)
FORCEPS BIOP RAD 4 LRG CAP 4 (CUTTING FORCEPS) ×4 IMPLANT
FORCEPS BIOP RJ4 240 W/NDL (MISCELLANEOUS)
FORCEPS ESCP3.2XJMB 240X2.8X (MISCELLANEOUS) IMPLANT
GOWN CVR UNV OPN BCK APRN NK (MISCELLANEOUS) ×4 IMPLANT
GOWN ISOL THUMB LOOP REG UNIV (MISCELLANEOUS) ×4
INJECTOR VARIJECT VIN23 (MISCELLANEOUS) IMPLANT
KIT DEFENDO VALVE AND CONN (KITS) IMPLANT
KIT ENDO PROCEDURE OLY (KITS) ×4 IMPLANT
MARKER SPOT ENDO TATTOO 5ML (MISCELLANEOUS) IMPLANT
PAD GROUND ADULT SPLIT (MISCELLANEOUS) IMPLANT
PROBE APC STR FIRE (PROBE) IMPLANT
RETRIEVER NET PLAT FOOD (MISCELLANEOUS) IMPLANT
RETRIEVER NET ROTH 2.5X230 LF (MISCELLANEOUS) ×4 IMPLANT
SNARE SHORT THROW 13M SML OVAL (MISCELLANEOUS) ×4 IMPLANT
SNARE SHORT THROW 30M LRG OVAL (MISCELLANEOUS) IMPLANT
SNARE SNG USE RND 15MM (INSTRUMENTS) IMPLANT
SPOT EX ENDOSCOPIC TATTOO (MISCELLANEOUS)
SYR INFLATION 60ML (SYRINGE) IMPLANT
TRAP ETRAP POLY (MISCELLANEOUS) ×4 IMPLANT
VARIJECT INJECTOR VIN23 (MISCELLANEOUS)
WATER STERILE IRR 250ML POUR (IV SOLUTION) ×4 IMPLANT
WIRE CRE 18-20MM 8CM F G (MISCELLANEOUS) IMPLANT

## 2016-01-08 NOTE — H&P (Signed)
  Lucilla Lame, MD [address] [place], [place] [address] Phone: [phone] Fax : [phone]  Primary Care Physician:  No PCP Per Patient Primary Gastroenterologist:  Dr. Allen Norris  Pre-Procedure History & Physical: HPI:  Jill King is a 55 y.o. female is here for an endoscopy and colonoscopy.   Past Medical History  Diagnosis Date  . Asthma   . COPD (chronic obstructive pulmonary disease) ([place])   . GERD (gastroesophageal reflux disease)   . Arthritis     hands, wrist  . Wears dentures     full upper and lower    Past Surgical History  Procedure Laterality Date  . Cesarean section    . Multiple tooth extractions      Prior to Admission medications   Medication Sig Start Date End Date Taking? Authorizing Provider  ePHEDrine-GuaiFENesin (PRIMATENE ASTHMA PO) Take by mouth daily.   Yes Historical Provider, MD  omeprazole (PRILOSEC) 20 MG capsule Take 1 capsule (20 mg total) by mouth daily. 11/30/15  Yes Andria Meuse, NP  polyethylene glycol (GOLYTELY) 236 g solution Take 4,000 mLs by mouth once. Drink 1 - 8oz. Glass of liquid until bowel movements are clear. 01/01/16  Yes Lucilla Lame, MD  ranitidine (ZANTAC) 150 MG tablet Take 1 tablet (150 mg total) by mouth at bedtime. 03/06/15 03/05/16 Yes Earleen Newport, MD    Allergies as of 01/01/2016  . (No Known Allergies)    Family History  Problem Relation Age of Onset  . COPD Mother   . Diabetes Father   . Heart disease Father     Social History   Social History  . Marital Status: Married    Spouse Name: N/A  . Number of Children: N/A  . Years of Education: N/A   Occupational History  . Not on file.   Social History Main Topics  . Smoking status: Former Smoker -- 1.00 packs/day for 30 years    Types: Cigarettes  . Smokeless tobacco: Former Systems developer    Quit date: 12/26/2014  . Alcohol Use: Yes     Comment: 1 drink /2 weeks  . Drug Use: No  . Sexual Activity: Not on file   Other Topics Concern  .  Not on file   Social History Narrative    Review of Systems: See HPI, otherwise negative ROS  Physical Exam: Ht 5\' 5"  (1.651 m)  Wt 116 lb (52.617 kg)  BMI 19.30 kg/m2 General:   Alert,  pleasant and cooperative in NAD Head:  Normocephalic and atraumatic. Neck:  Supple; no masses or thyromegaly. Lungs:  Clear throughout to auscultation.    Heart:  Regular rate and rhythm. Abdomen:  Soft, nontender and nondistended. Normal bowel sounds, without guarding, and without rebound.   Neurologic:  Alert and  oriented x4;  grossly normal neurologically.  Impression/Plan: Jill King is here for an endoscopy and colonoscopy to be performed for dysphagia and screening  Risks, benefits, limitations, and alternatives regarding  endoscopy and colonoscopy have been reviewed with the patient.  Questions have been answered.  All parties agreeable.   Lucilla Lame, MD  01/08/2016, 9:45 AM

## 2016-01-08 NOTE — Transfer of Care (Signed)
Immediate Anesthesia Transfer of Care Note  Patient: Jill King  Procedure(s) Performed: Procedure(s): COLONOSCOPY WITH PROPOFOL (N/A) ESOPHAGOGASTRODUODENOSCOPY (EGD) WITH PROPOFOL (N/A) POLYPECTOMY  Patient Location: PACU  Anesthesia Type: MAC  Level of Consciousness: awake, alert  and patient cooperative  Airway and Oxygen Therapy: Patient Spontanous Breathing and Patient connected to supplemental oxygen  Post-op Assessment: Post-op Vital signs reviewed, Patient's Cardiovascular Status Stable, Respiratory Function Stable, Patent Airway and No signs of Nausea or vomiting  Post-op Vital Signs: Reviewed and stable  Complications: No apparent anesthesia complications

## 2016-01-08 NOTE — Anesthesia Procedure Notes (Signed)
Procedure Name: MAC Performed by: Ondra Deboard Pre-anesthesia Checklist: Patient identified, Emergency Drugs available, Suction available, Timeout performed and Patient being monitored Patient Re-evaluated:Patient Re-evaluated prior to inductionOxygen Delivery Method: Nasal cannula Placement Confirmation: positive ETCO2       

## 2016-01-08 NOTE — Anesthesia Preprocedure Evaluation (Addendum)
Anesthesia Evaluation  Patient identified by MRN, date of birth, ID band Patient awake    Reviewed: Allergy & Precautions, NPO status , Patient's Chart, lab work & pertinent test results  Airway Mallampati: I  TM Distance: >3 FB Neck ROM: Full    Dental  (+) Upper Dentures, Lower Dentures   Pulmonary asthma , COPD, former smoker,    Pulmonary exam normal        Cardiovascular negative cardio ROS Normal cardiovascular exam     Neuro/Psych negative neurological ROS  negative psych ROS   GI/Hepatic Neg liver ROS, GERD  Medicated and Controlled,  Endo/Other  negative endocrine ROS  Renal/GU negative Renal ROS     Musculoskeletal  (+) Arthritis , Osteoarthritis,    Abdominal   Peds  Hematology negative hematology ROS (+)   Anesthesia Other Findings   Reproductive/Obstetrics                            Anesthesia Physical Anesthesia Plan  ASA: II  Anesthesia Plan: MAC   Post-op Pain Management:    Induction: Intravenous  Airway Management Planned:   Additional Equipment:   Intra-op Plan:   Post-operative Plan:   Informed Consent: I have reviewed the patients History and Physical, chart, labs and discussed the procedure including the risks, benefits and alternatives for the proposed anesthesia with the patient or authorized representative who has indicated his/her understanding and acceptance.     Plan Discussed with: CRNA  Anesthesia Plan Comments:         Anesthesia Quick Evaluation

## 2016-01-08 NOTE — Op Note (Signed)
[facility] Gastroenterology Patient Name: Jill King Procedure Date: 01/08/2016 10:51 AM MRN: [id number] Account #: [id number] Date of Birth: 07-17-1960 Admit Type: Outpatient Age: 55 Room: [facility] OR ROOM 01 Gender: Female Note Status: Finalized Procedure:            Colonoscopy Indications:          Screening for colorectal malignant neoplasm Providers:            Lucilla Lame MD, MD Medicines:            Propofol per Anesthesia Complications:        No immediate complications. Procedure:            Pre-Anesthesia Assessment:                       - Prior to the procedure, a History and Physical was                        performed, and patient medications and allergies were                        reviewed. The patient's tolerance of previous                        anesthesia was also reviewed. The risks and benefits of                        the procedure and the sedation options and risks were                        discussed with the patient. All questions were                        answered, and informed consent was obtained. Prior                        Anticoagulants: The patient has taken no previous                        anticoagulant or antiplatelet agents. ASA Grade                        Assessment: II - A patient with mild systemic disease.                        After reviewing the risks and benefits, the patient was                        deemed in satisfactory condition to undergo the                        procedure.                       After obtaining informed consent, the colonoscope was                        passed under direct vision. Throughout the procedure,                        the patient's blood pressure,  pulse, and oxygen                        saturations were monitored continuously. The Olympus CF                        H180AL colonoscope (S#: [id number]) was introduced through                        the anus and advanced to the  the cecum, identified by                        the appendiceal orifice, ileocecal valve and palpation.                        The colonoscopy was performed without difficulty. The                        patient tolerated the procedure well. The quality of                        the bowel preparation was good. Findings:      The perianal and digital rectal examinations were normal.      Four sessile polyps were found in the sigmoid colon. The polyps were 3       to 4 mm in size. These polyps were removed with a cold snare. Resection       and retrieval were complete.      Multiple small-mouthed diverticula were found in the entire colon. Impression:           - Four 3 to 4 mm polyps in the sigmoid colon, removed                        with a cold snare. Resected and retrieved.                       - Diverticulosis in the entire examined colon. Recommendation:       - Await pathology results.                       - Repeat colonoscopy in 5 years if polyp adenoma and 10                        years if hyperplastic Procedure Code(s):    --- Professional ---                       [phone], Colonoscopy, flexible; with removal of tumor(s),                        polyp(s), or other lesion(s) by snare technique Diagnosis Code(s):    --- Professional ---                       Z12.11, Encounter for screening for malignant neoplasm                        of colon                       D12.5, Benign neoplasm of sigmoid  colon CPT copyright 2016 American Medical [employer]. All rights reserved. The codes documented in this report are preliminary and upon coder review may  be revised to meet current compliance requirements. Lucilla Lame MD, MD 01/08/2016 11:25:24 AM This report has been signed electronically. Number of Addenda: 0 Note Initiated On: 01/08/2016 10:51 AM Scope Withdrawal Time: 0 hours 7 minutes 0 seconds  Total Procedure Duration: 0 hours 12 minutes 35 seconds       [facility]

## 2016-01-08 NOTE — Op Note (Signed)
[facility] Patient Name: Jill King Procedure Date: 01/08/2016 10:52 AM MRN: [id number] Account #: [id number] Date of Birth: 1961-05-01 Admit Type: Outpatient Age: 55 Room: [facility] OR ROOM 01 Gender: Female Note Status: Finalized Procedure:            Upper GI endoscopy Indications:          Dysphagia Providers:            Lucilla Lame MD, MD Medicines:            Propofol per Anesthesia Complications:        No immediate complications. Procedure:            Pre-Anesthesia Assessment:                       - Prior to the procedure, a History and Physical was                        performed, and patient medications and allergies were                        reviewed. The patient's tolerance of previous                        anesthesia was also reviewed. The risks and benefits of                        the procedure and the sedation options and risks were                        discussed with the patient. All questions were                        answered, and informed consent was obtained. Prior                        Anticoagulants: The patient has taken no previous                        anticoagulant or antiplatelet agents. ASA Grade                        Assessment: II - A patient with mild systemic disease.                        After reviewing the risks and benefits, the patient was                        deemed in satisfactory condition to undergo the                        procedure.                       After obtaining informed consent, the endoscope was                        passed under direct vision. Throughout the procedure,                        the patient's blood pressure, pulse, and  oxygen                        saturations were monitored continuously. The was                        introduced through the mouth, and advanced to the                        second part of duodenum. The upper GI endoscopy was     accomplished without difficulty. The patient tolerated                        the procedure well. Findings:      The examined esophagus was normal. Random biopsies were obtained in the       middle third of the esophagus with cold forceps for histology.      The stomach was normal.      The examined duodenum was normal. Impression:           - Normal esophagus.                       - Normal stomach.                       - Normal examined duodenum.                       - Random biopsies were obtained in the middle third of                        the esophagus. Recommendation:       - Await pathology results.                       - Perform a colonoscopy today. Procedure Code(s):    --- Professional ---                       [phone], Esophagogastroduodenoscopy, flexible, transoral;                        with biopsy, single or multiple Diagnosis Code(s):    --- Professional ---                       R13.10, Dysphagia, unspecified CPT copyright 2016 American Medical Association. All rights reserved. The codes documented in this report are preliminary and upon coder review may  be revised to meet current compliance requirements. Lucilla Lame MD, MD 01/08/2016 11:08:32 AM This report has been signed electronically. Number of Addenda: 0 Note Initiated On: 01/08/2016 10:52 AM      [facility]

## 2016-01-08 NOTE — Anesthesia Postprocedure Evaluation (Signed)
Anesthesia Post Note  Patient: Jill King  Procedure(s) Performed: Procedure(s) (LRB): COLONOSCOPY WITH PROPOFOL (N/A) ESOPHAGOGASTRODUODENOSCOPY (EGD) WITH PROPOFOL (N/A) POLYPECTOMY  Patient location during evaluation: PACU Anesthesia Type: MAC Level of consciousness: awake and alert and oriented Pain management: pain level controlled Vital Signs Assessment: post-procedure vital signs reviewed and stable Respiratory status: spontaneous breathing and nonlabored ventilation Cardiovascular status: stable Postop Assessment: no signs of nausea or vomiting and adequate PO intake Anesthetic complications: no    Estill Batten

## 2016-01-09 ENCOUNTER — Encounter: Payer: Self-pay | Admitting: Gastroenterology

## 2016-01-12 ENCOUNTER — Encounter: Payer: Self-pay | Admitting: Gastroenterology

## 2016-01-16 ENCOUNTER — Telehealth: Payer: Self-pay

## 2016-01-16 NOTE — Telephone Encounter (Signed)
LVM for pt to return my call to schedule a follow up appt.  

## 2016-01-16 NOTE — Telephone Encounter (Signed)
-----   Message from Lucilla Lame, MD sent at 01/14/2016  2:12 PM EDT ----- Please have the patient come in for a follow up.

## 2016-01-28 ENCOUNTER — Encounter: Payer: Self-pay | Admitting: Gastroenterology

## 2016-01-28 ENCOUNTER — Ambulatory Visit (INDEPENDENT_AMBULATORY_CARE_PROVIDER_SITE_OTHER): Payer: Self-pay | Admitting: Gastroenterology

## 2016-01-28 VITALS — BP 167/102 | HR 83 | Temp 98.1°F | Ht 65.0 in | Wt 117.0 lb

## 2016-01-28 DIAGNOSIS — R1310 Dysphagia, unspecified: Secondary | ICD-10-CM

## 2016-01-28 MED ORDER — PANTOPRAZOLE SODIUM 40 MG PO TBEC: 40.0000 mg | DELAYED_RELEASE_TABLET | Freq: Every day | ORAL | 11 refills | Status: DC

## 2016-01-29 NOTE — Progress Notes (Signed)
   Primary Care Physician: No PCP Per Patient  Primary Gastroenterologist:  Dr. Lucilla Lame  Chief Complaint  Patient presents with  . Follow up EGD results    EGD on 01/08/16    HPI: Jill King is a 55 y.o. female here for follow-up after having an upper endoscopy. The patient had an upper endoscopy that showed eosinophils in the midesophagus. This was consistent with possible eosinophilic esophagitis versus reflux. The patient states that she is still having problems swallowing.  Current Outpatient Prescriptions  Medication Sig Dispense Refill  . omeprazole (PRILOSEC) 20 MG capsule Take 1 capsule (20 mg total) by mouth daily. 31 capsule 0  . ranitidine (ZANTAC) 150 MG tablet Take 1 tablet (150 mg total) by mouth at bedtime. 30 tablet 1  . ePHEDrine-GuaiFENesin (PRIMATENE ASTHMA PO) Take by mouth daily.    . pantoprazole (PROTONIX) 40 MG tablet Take 1 tablet (40 mg total) by mouth daily. 30 tablet 11   No current facility-administered medications for this visit.     Allergies as of 01/28/2016  . (No Known Allergies)    ROS:  General: Negative for anorexia, weight loss, fever, chills, fatigue, weakness. ENT: Negative for hoarseness, difficulty swallowing , nasal congestion. CV: Negative for chest pain, angina, palpitations, dyspnea on exertion, peripheral edema.  Respiratory: Negative for dyspnea at rest, dyspnea on exertion, cough, sputum, wheezing.  GI: See history of present illness. GU:  Negative for dysuria, hematuria, urinary incontinence, urinary frequency, nocturnal urination.  Endo: Negative for unusual weight change.    Physical Examination:   BP (!) 167/102   Pulse 83   Temp 98.1 F (36.7 C) (Oral)   Ht 5\' 5"  (1.651 m)   Wt 117 lb (53.1 kg)   BMI 19.47 kg/m   General: Well-nourished, well-developed in no acute distress.  Eyes: No icterus. Conjunctivae pink. Mouth: Oropharyngeal mucosa moist and pink , no lesions erythema or exudate. Lungs: Clear to  auscultation bilaterally. Non-labored. Heart: Regular rate and rhythm, no murmurs rubs or gallops.  Abdomen: Bowel sounds are normal, nontender, nondistended, no hepatosplenomegaly or masses, no abdominal bruits or hernia , no rebound or guarding.   Extremities: No lower extremity edema. No clubbing or deformities. Neuro: Alert and oriented x 3.  Grossly intact. Skin: Warm and dry, no jaundice.   Psych: Alert and cooperative, normal mood and affect.  Labs:    Imaging Studies: No results found.  Assessment and Plan:   Jill King is a 55 y.o. y/o female who has a history of dysphagia. The patient had a EGD with biopsies of the midesophagus. The biopsy showed increased eosinophils and it was suggested that this could be consistent with reflux versus eosinophilic esophagitis. The patient will be started on a PPI to see if this helps her symptoms. If it does not she will contact my office and be started on medication for eosinophilic esophagitis. The patient has been explained the plan and agrees with it.   Note: This dictation was prepared with Dragon dictation along with smaller phrase technology. Any transcriptional errors that result from this process are unintentional.

## 2016-02-13 ENCOUNTER — Ambulatory Visit
Admission: EM | Admit: 2016-02-13 | Discharge: 2016-02-13 | Disposition: A | Discharge status: Home / Self Care | Payer: Self-pay | Attending: Internal Medicine | Admitting: Internal Medicine

## 2016-02-13 DIAGNOSIS — L0103 Bullous impetigo: Secondary | ICD-10-CM

## 2016-02-13 DIAGNOSIS — S90422A Blister (nonthermal), left great toe, initial encounter: Secondary | ICD-10-CM

## 2016-02-13 MED ORDER — CEPHALEXIN 500 MG PO CAPS: 500.0000 mg | ORAL_CAPSULE | Freq: Two times a day (BID) | ORAL | 0 refills | Status: DC

## 2016-02-13 MED ORDER — MUPIROCIN 2 % EX OINT: 1.0000 "application " | TOPICAL_OINTMENT | Freq: Two times a day (BID) | CUTANEOUS | 0 refills | Status: DC

## 2016-02-13 MED ORDER — HYDROCODONE-ACETAMINOPHEN 5-325 MG PO TABS: 1.0000 | ORAL_TABLET | Freq: Four times a day (QID) | ORAL | 0 refills | Status: DC | PRN

## 2016-02-13 NOTE — Discharge Instructions (Signed)
Wash site gently with soap/water 1-2x daily and apply antibiotic ointment and bandage. Recheck for increasing redness/pain/swelling/drainage, or for new fever >100.5.  Anticipate gradual improvement/healing over the next several days.

## 2016-02-13 NOTE — ED Triage Notes (Signed)
Patient complains of large blister that came up on Monday on her left side of her foot. Patient states that she has been wearing flip flops since Monday. Patient states that she has no known trauma or burn to the area, denies wearing small shoes or rubbing area.

## 2016-02-13 NOTE — ED Provider Notes (Signed)
[facility]    CSN: [id number] Arrival date & time: 02/13/16  1021  First Provider Contact:  First MD Initiated Contact with Patient 02/13/16 1040        History   Chief Complaint Chief Complaint  Patient presents with  . Blister    HPI Jill King is a 55 y.o. female. She presents today with a large painful blister on the medial aspect of her left proximal great toe. This appeared 4 days ago, and is not resolving. She does not recall any trauma, hasn't worn any tight shoes, no bug bites, doesn't walk in the yard barefoot. She had a smaller blister at the tip of her left great toe, and drained this, with return of cloudy fluid. This is mostly resolved, but there is a small residual blister. No fever. No malaise. No redness or swelling of the foot, just this big blister.   HPI  Past Medical History:  Diagnosis Date  . Arthritis    hands, wrist  . Asthma   . COPD (chronic obstructive pulmonary disease) ([place])   . GERD (gastroesophageal reflux disease)   . Wears dentures    full upper and lower    Patient Active Problem List   Diagnosis Date Noted  . Special screening for malignant neoplasms, colon   . Benign neoplasm of sigmoid colon   . Problems with swallowing and mastication   . COPD (chronic obstructive pulmonary disease) ([place]) 01/01/2016  . Asthma 01/01/2016  . GERD (gastroesophageal reflux disease) 01/01/2016    Past Surgical History:  Procedure Laterality Date  . CESAREAN SECTION    . COLONOSCOPY WITH PROPOFOL N/A 01/08/2016   Procedure: COLONOSCOPY WITH PROPOFOL;  Surgeon: Lucilla Lame, MD;  Location: [place];  Service: Endoscopy;  Laterality: N/A;  . ESOPHAGOGASTRODUODENOSCOPY (EGD) WITH PROPOFOL N/A 01/08/2016   Procedure: ESOPHAGOGASTRODUODENOSCOPY (EGD) WITH PROPOFOL;  Surgeon: Lucilla Lame, MD;  Location: [place];  Service: Endoscopy;  Laterality: N/A;  . MULTIPLE TOOTH EXTRACTIONS    . POLYPECTOMY  01/08/2016   Procedure:  POLYPECTOMY;  Surgeon: Lucilla Lame, MD;  Location: [place];  Service: Endoscopy;;     Home Medications    Prior to Admission medications   Medication Sig Start Date End Date Taking? Authorizing Provider  pantoprazole (PROTONIX) 40 MG tablet Take 1 tablet (40 mg total) by mouth daily. 01/28/16  Yes Lucilla Lame, MD    Family History Family History  Problem Relation Age of Onset  . COPD Mother   . Diabetes Father   . Heart disease Father     Social History Social History  Substance Use Topics  . Smoking status: Former Smoker    Packs/day: 1.00    Years: 30.00    Types: Cigarettes    Quit date: 12/26/2014  . Smokeless tobacco: Never Used  . Alcohol use Yes     Comment: 1 drink /2 weeks     Allergies   Review of patient's allergies indicates no known allergies.   Review of Systems Review of Systems  All other systems reviewed and are negative.    Physical Exam Triage Vital Signs ED Triage Vitals  Enc Vitals Group     BP 02/13/16 1037 (!) 161/91     Pulse Rate 02/13/16 1037 78     Resp 02/13/16 1037 16     Temp 02/13/16 1037 97.3 F (36.3 C)     Temp Source 02/13/16 1037 Tympanic     SpO2 02/13/16 1037 100 %  Weight 02/13/16 1035 117 lb (53.1 kg)     Height 02/13/16 1035 5\' 5"  (1.651 m)     Pain Score 02/13/16 1037 10    Updated Vital Signs BP (!) 161/91 (BP Location: Left Arm)   Pulse 78   Temp 97.3 F (36.3 C) (Tympanic)   Resp 16   Ht 5\' 5"  (1.651 m)   Wt 117 lb (53.1 kg)   SpO2 100%   BMI 19.47 kg/m  Physical Exam  Constitutional: She is oriented to person, place, and time. No distress.  Alert, nicely groomed  HENT:  Head: Atraumatic.  Eyes:  Conjugate gaze, no eye redness/drainage  Neck: Neck supple.  Cardiovascular: Normal rate.   Pulmonary/Chest: No respiratory distress.  Abdominal: She exhibits no distension.  Musculoskeletal: Normal range of motion.  No leg swelling  Neurological: She is alert and oriented to person,  place, and time.  Skin: Skin is warm and dry.  No cyanosis 1.5 inch tense blister at the medial aspect of the left first MTP. Blisters filled with clear serosanguineous fluid. Tender. Half-inch blister at the tip of the toe, overlying callus, appears to be resolving, not inflamed. The foot is warm, without swelling/redness. No bruising. No blistering or rash of the forearms or other sun exposed skin.  Nursing note and vitals reviewed.    UC Treatments / Results   Procedures Procedures (including critical care time) The blister was prepped with Hibiclens, and briefly cooled with ethyl chloride spray. A stab incision was made a #11 blade with return of copious serosanguineous fluid, and flattening of the blister. Site was washed with Hibiclens, antibiotic ointment and a bandage were applied   Final Clinical Impressions(s) / UC Diagnoses   Final diagnoses:  Blister of great toe of left foot, initial encounter  Impetigo bullosa   Wash site gently with soap and water 1-2 times daily, apply antibiotic ointment and a bandage. Recheck for increasing redness/swelling/drainage/pain, or new fever greater than 100.5 anticipate gradual improvement/healing over the next several days.   New Prescriptions New Prescriptions   CEPHALEXIN (KEFLEX) 500 MG CAPSULE    Take 1 capsule (500 mg total) by mouth 2 (two) times daily.   MUPIROCIN OINTMENT (BACTROBAN) 2 %    Apply 1 application topically 2 (two) times daily.     Sherlene Shams, MD 02/13/16 [phone]

## 2016-02-24 ENCOUNTER — Telehealth: Payer: Self-pay | Admitting: Gastroenterology

## 2016-02-24 NOTE — Telephone Encounter (Signed)
Left vm for pt to return my call. Advised her she can take 2 pills daily but will run out of her medication sooner and insurance will not cover a refill if it is too soon. Waiting on a return call to discuss.

## 2016-02-24 NOTE — Telephone Encounter (Signed)
The medication that Dr. Allen Norris gave her isn't working. It worked at the beginning but not now. Can she up it to 2 a day.

## 2016-02-25 NOTE — Telephone Encounter (Signed)
Patient returned your call today.

## 2016-02-25 NOTE — Telephone Encounter (Signed)
Pt returned my call and we discussed increasing her Pantoprazole to twice daily. Ok'd per Dr. Allen Norris. Pt just refilled rx yesterday and will contact me when she runs out. Pt also requested a referral to [facility]. Advised her they may not accept a referral from [place] but I would fax all information to them. Fax this to [facility] at [phone]

## 2016-03-17 ENCOUNTER — Encounter: Payer: Self-pay | Admitting: Intensive Care

## 2016-03-17 ENCOUNTER — Emergency Department: Payer: Self-pay

## 2016-03-17 ENCOUNTER — Emergency Department
Admission: EM | Admit: 2016-03-17 | Discharge: 2016-03-17 | Disposition: A | Discharge status: Home / Self Care | Payer: Self-pay | Attending: Emergency Medicine | Admitting: Emergency Medicine

## 2016-03-17 DIAGNOSIS — J45909 Unspecified asthma, uncomplicated: Secondary | ICD-10-CM | POA: Insufficient documentation

## 2016-03-17 DIAGNOSIS — R1013 Epigastric pain: Secondary | ICD-10-CM | POA: Insufficient documentation

## 2016-03-17 DIAGNOSIS — J439 Emphysema, unspecified: Secondary | ICD-10-CM | POA: Insufficient documentation

## 2016-03-17 DIAGNOSIS — Z87891 Personal history of nicotine dependence: Secondary | ICD-10-CM | POA: Insufficient documentation

## 2016-03-17 HISTORY — DX: Emphysema, unspecified: J43.9

## 2016-03-17 LAB — URINALYSIS COMPLETE WITH MICROSCOPIC (ARMC ONLY)
BILIRUBIN URINE: NEGATIVE
Bacteria, UA: NONE SEEN
GLUCOSE, UA: NEGATIVE mg/dL
HGB URINE DIPSTICK: NEGATIVE
KETONES UR: NEGATIVE mg/dL
LEUKOCYTES UA: NEGATIVE
NITRITE: NEGATIVE
Protein, ur: NEGATIVE mg/dL
RBC / HPF: NONE SEEN RBC/hpf (ref 0–5)
SPECIFIC GRAVITY, URINE: 1.016 (ref 1.005–1.030)
pH: 5 (ref 5.0–8.0)

## 2016-03-17 LAB — COMPREHENSIVE METABOLIC PANEL
ALT: 29 U/L (ref 14–54)
AST: 39 U/L (ref 15–41)
Albumin: 4.5 g/dL (ref 3.5–5.0)
Alkaline Phosphatase: 140 U/L — ABNORMAL HIGH (ref 38–126)
Anion gap: 8 (ref 5–15)
BILIRUBIN TOTAL: 0.7 mg/dL (ref 0.3–1.2)
BUN: 13 mg/dL (ref 6–20)
CALCIUM: 9.4 mg/dL (ref 8.9–10.3)
CO2: 26 mmol/L (ref 22–32)
CREATININE: 0.58 mg/dL (ref 0.44–1.00)
Chloride: 106 mmol/L (ref 101–111)
Glucose, Bld: 78 mg/dL (ref 65–99)
Potassium: 3.9 mmol/L (ref 3.5–5.1)
Sodium: 140 mmol/L (ref 135–145)
TOTAL PROTEIN: 7.6 g/dL (ref 6.5–8.1)

## 2016-03-17 LAB — CBC
HCT: 41 % (ref 35.0–47.0)
Hemoglobin: 14.5 g/dL (ref 12.0–16.0)
MCH: 31.8 pg (ref 26.0–34.0)
MCHC: 35.4 g/dL (ref 32.0–36.0)
MCV: 89.7 fL (ref 80.0–100.0)
PLATELETS: 230 10*3/uL (ref 150–440)
RBC: 4.57 MIL/uL (ref 3.80–5.20)
RDW: 11.9 % (ref 11.5–14.5)
WBC: 6.2 10*3/uL (ref 3.6–11.0)

## 2016-03-17 LAB — LIPASE, BLOOD: Lipase: 28 U/L (ref 11–51)

## 2016-03-17 MED ORDER — ALBUTEROL SULFATE HFA 108 (90 BASE) MCG/ACT IN AERS: 2.0000 | INHALATION_SPRAY | Freq: Four times a day (QID) | RESPIRATORY_TRACT | 2 refills | Status: DC | PRN

## 2016-03-17 MED ORDER — IOPAMIDOL (ISOVUE-300) INJECTION 61%
100.0000 mL | Freq: Once | INTRAVENOUS | Status: AC | PRN
Start: 2016-03-17 — End: 2016-03-17
  Administered 2016-03-17: 100 mL via INTRAVENOUS

## 2016-03-17 MED ORDER — IOPAMIDOL (ISOVUE-300) INJECTION 61%
30.0000 mL | Freq: Once | INTRAVENOUS | Status: AC
  Administered 2016-03-17: 30 mL via ORAL

## 2016-03-17 MED ORDER — ACETAMINOPHEN 500 MG PO TABS
1000.0000 mg | ORAL_TABLET | ORAL | Status: AC
  Administered 2016-03-17: 1000 mg via ORAL
  Filled 2016-03-17: qty 2

## 2016-03-17 MED ORDER — ALUM & MAG HYDROXIDE-SIMETH 200-200-20 MG/5ML PO SUSP
30.0000 mL | Freq: Once | ORAL | Status: AC
  Administered 2016-03-17: 30 mL via ORAL
  Filled 2016-03-17: qty 30

## 2016-03-17 NOTE — Discharge Instructions (Signed)
? ?  Please return to the emergency room right away if you are to develop a fever, severe nausea, your pain becomes severe or worsens, you are unable to keep food down, begin vomiting any dark or bloody fluid, you develop any dark or bloody stools, feel dehydrated, or other new concerns or symptoms arise. ? ?

## 2016-03-17 NOTE — ED Notes (Signed)
Pt return to room from xray.

## 2016-03-17 NOTE — ED Triage Notes (Addendum)
Pt states "I was diagnosed with EOE X3 days ago. I have been struggling to breath and having lower abdominal pain X3 days. I have HX of COPD, asthma, emphysema". Denies N/V/D. Last BM was Monday. Pt ambulatory in triage. Pt has been taking pantoprazole at home for acid. Breathing unlabored in triage and NAD noted. Denies chest pain at this time

## 2016-03-17 NOTE — ED Notes (Signed)
Pt has multiple complaints today, complaints of bloated abdomen, painful abdomen, feeling full and not being able to eat meals, and shortness of breath.  MD Quale at bedside.

## 2016-03-17 NOTE — ED Provider Notes (Signed)
[facility] Emergency Department Provider Note   ____________________________________________   First MD Initiated Contact with Patient 03/17/16 0930     (approximate)  I have reviewed the triage vital signs and the nursing notes.   HISTORY  Chief Complaint Abdominal Pain and Shortness of Breath    HPI Jill King is a 55 y.o. female reports that for the last several months she's had severe acid reflux, she was diagnosed with eosinophilic esophagitis, and for the last week she's felt as though she's had a slight swelling and discomfort in her upper abdomen. He is able to eat and drink, but feels nauseated when she drinks a large amount or eats a lot of food. She is not vomiting. No fevers or chills. She reports she feels slightly short of breath because of what feels like pressure in her upper abdomen, but shefeels like she can't take a full deep breath because of the discomfort in the upper abdomen.  Reports she is getting up acid frequently, and taking her prescribed acid medicine twice daily. She's had no endoscopy, colonoscopy, and follows with Dr. Allen Norris of general surgery   Denies cough or wheezing.  Past Medical History:  Diagnosis Date  . Arthritis    hands, wrist  . Asthma   . COPD (chronic obstructive pulmonary disease) ([place])   . Emphysema of lung ([place])   . GERD (gastroesophageal reflux disease)   . Wears dentures    full upper and lower    Patient Active Problem List   Diagnosis Date Noted  . Special screening for malignant neoplasms, colon   . Benign neoplasm of sigmoid colon   . Problems with swallowing and mastication   . COPD (chronic obstructive pulmonary disease) ([place]) 01/01/2016  . Asthma 01/01/2016  . GERD (gastroesophageal reflux disease) 01/01/2016    Past Surgical History:  Procedure Laterality Date  . CESAREAN SECTION    . COLONOSCOPY WITH PROPOFOL N/A 01/08/2016   Procedure: COLONOSCOPY WITH PROPOFOL;  Surgeon: Lucilla Lame, MD;  Location: [place];  Service: Endoscopy;  Laterality: N/A;  . ESOPHAGOGASTRODUODENOSCOPY (EGD) WITH PROPOFOL N/A 01/08/2016   Procedure: ESOPHAGOGASTRODUODENOSCOPY (EGD) WITH PROPOFOL;  Surgeon: Lucilla Lame, MD;  Location: [place];  Service: Endoscopy;  Laterality: N/A;  . MULTIPLE TOOTH EXTRACTIONS    . POLYPECTOMY  01/08/2016   Procedure: POLYPECTOMY;  Surgeon: Lucilla Lame, MD;  Location: [place];  Service: Endoscopy;;    Prior to Admission medications   Medication Sig Start Date End Date Taking? Authorizing Provider  albuterol (PROVENTIL HFA;VENTOLIN HFA) 108 (90 Base) MCG/ACT inhaler Inhale 2 puffs into the lungs every 6 (six) hours as needed for wheezing or shortness of breath. 03/17/16   Delman Kitten, MD  cephALEXin (KEFLEX) 500 MG capsule Take 1 capsule (500 mg total) by mouth 2 (two) times daily. 02/13/16   Sherlene Shams, MD  HYDROcodone-acetaminophen (NORCO/VICODIN) 5-325 MG tablet Take 1-2 tablets by mouth 4 (four) times daily as needed. 02/13/16   Sherlene Shams, MD  mupirocin ointment (BACTROBAN) 2 % Apply 1 application topically 2 (two) times daily. 02/13/16   Sherlene Shams, MD  pantoprazole (PROTONIX) 40 MG tablet Take 1 tablet (40 mg total) by mouth daily. 01/28/16   Lucilla Lame, MD    Allergies Review of patient's allergies indicates no known allergies.  Family History  Problem Relation Age of Onset  . COPD Mother   . Diabetes Father   . Heart disease Father  Social History Social History  Substance Use Topics  . Smoking status: Former Smoker    Packs/day: 1.00    Years: 30.00    Types: Cigarettes    Quit date: 12/26/2014  . Smokeless tobacco: Never Used  . Alcohol use Yes     Comment: 1 drink /2 weeks    Review of Systems Constitutional: No fever/chills Eyes: No visual changes. ENT: No sore throat. Cardiovascular: Denies chest pain. Respiratory: See history of present illness, states she feels like she can take a  normal breath and is not wheezing, but she reports feeling in her upper stomach tends to make her feel as though she can't take a full breath sometimes. Gastrointestinal:   No diarrhea.  No constipation. Genitourinary: Negative for dysuria. Musculoskeletal: Negative for back pain. Skin: Negative for rash. Neurological: Negative for headaches, focal weakness or numbness.  Has gone through menopause  10-point ROS otherwise negative.  ____________________________________________   PHYSICAL EXAM:  VITAL SIGNS: ED Triage Vitals  Enc Vitals Group     BP 03/17/16 0910 (!) 145/88     Pulse Rate 03/17/16 0910 75     Resp 03/17/16 0910 20     Temp 03/17/16 0910 97.8 F (36.6 C)     Temp Source 03/17/16 0910 Oral     SpO2 03/17/16 0910 97 %     Weight 03/17/16 0911 117 lb (53.1 kg)     Height 03/17/16 0911 5\' 4"  (1.626 m)     Head Circumference --      Peak Flow --      Pain Score 03/17/16 0911 7     Pain Loc --      Pain Edu? --      Excl. in [place]? --     Constitutional: Alert and oriented. Well appearing and in no acute distress. Eyes: Conjunctivae are normal. PERRL. EOMI. Head: Atraumatic. Nose: No congestion/rhinnorhea. Mouth/Throat: Mucous membranes are moist.  Oropharynx non-erythematous. Neck: No stridor.   Cardiovascular: Normal rate, regular rhythm. Grossly normal heart sounds.  Good peripheral circulation.Speaks in full normal sentences. Clear lungs normal oxygen saturation. Respiratory: Normal respiratory effort.  No retractions. Lungs CTAB. Gastrointestinal: Soft and nontender except for mild soft area of minimal swelling palpable in the epigastrium. No distention.  Musculoskeletal: No lower extremity tenderness nor edema.  No joint effusions. Neurologic:  Normal speech and language. No gross focal neurologic deficits are appreciated. No gait instability. Skin:  Skin is warm, dry and intact. No rash noted. Psychiatric: Mood and affect are normal. Speech and behavior are  normal.  ____________________________________________   LABS (all labs ordered are listed, but only abnormal results are displayed)  Labs Reviewed  COMPREHENSIVE METABOLIC PANEL - Abnormal; Notable for the following:       Result Value   Alkaline Phosphatase 140 (*)    All other components within normal limits  URINALYSIS COMPLETEWITH MICROSCOPIC ([facility] ONLY) - Abnormal; Notable for the following:    Color, Urine YELLOW (*)    APPearance CLEAR (*)    Squamous Epithelial / LPF 0-5 (*)    All other components within normal limits  LIPASE, BLOOD  CBC   ____________________________________________  EKG  ED ECG REPORT I, Chrishelle Zito, the attending physician, personally viewed and interpreted this ECG.  Date: 03/17/2016 EKG Time: 11:00 Rate: 60 Rhythm: normal sinus rhythm QRS Axis: normal Intervals: normal ST/T Wave abnormalities: normal, minimal T-wave inversions noted in V1 and V2. Patient's perfuse EKG from September 8 of last year same. No  evidence of change Conduction Disturbances: none Narrative Interpretation: unremarkable  ____________________________________________  RADIOLOGY  Ct Abdomen Pelvis W Contrast  Result Date: 03/17/2016 CLINICAL DATA:  Upper abdominal discomfort.  Swelling. EXAM: CT ABDOMEN AND PELVIS WITH CONTRAST TECHNIQUE: Multidetector CT imaging of the abdomen and pelvis was performed using the standard protocol following bolus administration of intravenous contrast. CONTRAST:  147mL ISOVUE-300 IOPAMIDOL (ISOVUE-300) INJECTION 61% COMPARISON:  None. FINDINGS: Lower chest: No acute abnormality. Hepatobiliary: Micronodular contour of the liver as can be seen with cirrhosis. No gallstones, gallbladder wall thickening, or biliary dilatation. Pancreas: Unremarkable. No pancreatic ductal dilatation or surrounding inflammatory changes. Spleen: Normal in size without focal abnormality. Adrenals/Urinary Tract: Adrenal glands are unremarkable. Kidneys are normal,  without renal calculi, focal lesion, or hydronephrosis. Bladder is unremarkable. Stomach/Bowel: Stomach is within normal limits. Appendix appears normal. No evidence of bowel wall thickening, distention, or inflammatory changes. Vascular/Lymphatic: No lymphadenopathy. Abdominal aortic atherosclerosis. Reproductive: Uterus and bilateral adnexa are unremarkable. Other: No abdominal wall hernia or abnormality. No abdominopelvic ascites. Musculoskeletal: No acute or significant osseous findings. IMPRESSION: 1. No acute abdominal or pelvic pathology. 2. Micronodular contour of the liver as can be seen with cirrhosis. Electronically Signed   By: Kathreen Devoid   On: 03/17/2016 12:41    ____________________________________________   PROCEDURES  Procedure(s) performed: None  Procedures  Critical Care performed: No  ____________________________________________   INITIAL IMPRESSION / ASSESSMENT AND PLAN / ED COURSE  Pertinent labs & imaging results that were available during my care of the patient were reviewed by me and considered in my medical decision making (see chart for details).  Patient presents for evaluation of what appears to be abdominal pain and a feeling of a swollen area or mass that is not really tender in the upper abdomen. It is midline, located in epigastrium. She reports that this swollen area seems to make it feel as though she can't take a deep breath at times and is causing her discomfort which is been fairly steady over the last week. No evidence of an acute surgical abdomen, but she does demonstrate what could be a small ventral hernia or potential mass in the upper abdomen versus distended stomach. We'll proceed with CT imaging to further evaluate. Regarding shortness of breath, her lungs are clear, she speaks in full sentences and normal oxygen saturation. Does not appear consistent with presentation of acute coronary syndrome or an obvious respiratory concerns such as COPD,  rather I think discomfort in the upper abdomen is referring and causing her a feeling of slight dyspnea.  Clinical Course   ----------------------------------------- 1:36 PM on 03/17/2016 -----------------------------------------  Patient reports symptom-free. She feels well presently. No concerns. She will follow-up with Dr. Allen Norris. She does request a refill of an inhaler, she reports that she has run out of them at home. Presently denies any shortness of breath or wheezing.  Vitals:   03/17/16 0910 03/17/16 1030  BP: (!) 145/88 140/82  Pulse: 75   Resp: 20 18  Temp: 97.8 F (36.6 C)      ____________________________________________   FINAL CLINICAL IMPRESSION(S) / ED DIAGNOSES  Final diagnoses:  Epigastric abdominal pain  Pulmonary emphysema, unspecified emphysema type (HCC)      NEW MEDICATIONS STARTED DURING THIS VISIT:  New Prescriptions   ALBUTEROL (PROVENTIL HFA;VENTOLIN HFA) 108 (90 BASE) MCG/ACT INHALER    Inhale 2 puffs into the lungs every 6 (six) hours as needed for wheezing or shortness of breath.     Note:  This document  was prepared using Systems analyst and may include unintentional dictation errors.     Delman Kitten, MD 03/17/16 [phone]

## 2016-03-17 NOTE — ED Notes (Signed)
Pt unhooked from monitor for DC.

## 2016-09-22 ENCOUNTER — Encounter: Payer: Self-pay | Admitting: *Deleted

## 2016-09-22 ENCOUNTER — Ambulatory Visit
Admission: EM | Admit: 2016-09-22 | Discharge: 2016-09-22 | Disposition: A | Discharge status: Home / Self Care | Payer: Self-pay | Attending: Family Medicine | Admitting: Family Medicine

## 2016-09-22 DIAGNOSIS — S29019A Strain of muscle and tendon of unspecified wall of thorax, initial encounter: Secondary | ICD-10-CM

## 2016-09-22 MED ORDER — HYDROCODONE-ACETAMINOPHEN 5-325 MG PO TABS: ORAL_TABLET | ORAL | 0 refills | Status: DC

## 2016-09-22 MED ORDER — CYCLOBENZAPRINE HCL 10 MG PO TABS: 10.0000 mg | ORAL_TABLET | Freq: Every day | ORAL | 0 refills | Status: DC

## 2016-09-22 MED ORDER — IBUPROFEN 800 MG PO TABS: 800.0000 mg | ORAL_TABLET | Freq: Three times a day (TID) | ORAL | 0 refills | Status: DC

## 2016-09-22 NOTE — ED Provider Notes (Signed)
[facility]    CSN: [id number] Arrival date & time: 09/22/16  1102     History   Chief Complaint Chief Complaint  Patient presents with  . Neck Pain    HPI Jill King is a 56 y.o. female.    Neck Pain  Pain location:  L side Quality:  Aching Pain radiates to:  L scapula Pain severity:  Moderate Pain is:  Same all the time Onset quality:  Sudden Duration:  1 week Timing:  Constant Progression:  Worsening Chronicity:  New Context: lifting a heavy object (states symptoms started since she did some heavy lifting work at home)   Worsened by:  Twisting and position Ineffective treatments: icy hot. Associated symptoms: no bladder incontinence, no bowel incontinence, no headaches, no numbness, no paresis, no tingling and no weight loss   Risk factors: no hx of head and neck radiation, no hx of osteoporosis, no hx of spinal trauma, no recent epidural, no recent head injury and no recurrent falls     Past Medical History:  Diagnosis Date  . Arthritis    hands, wrist  . Asthma   . COPD (chronic obstructive pulmonary disease) ([place])   . Emphysema of lung ([place])   . GERD (gastroesophageal reflux disease)   . Wears dentures    full upper and lower    Patient Active Problem List   Diagnosis Date Noted  . Special screening for malignant neoplasms, colon   . Benign neoplasm of sigmoid colon   . Problems with swallowing and mastication   . COPD (chronic obstructive pulmonary disease) ([place]) 01/01/2016  . Asthma 01/01/2016  . GERD (gastroesophageal reflux disease) 01/01/2016    Past Surgical History:  Procedure Laterality Date  . CESAREAN SECTION    . COLONOSCOPY WITH PROPOFOL N/A 01/08/2016   Procedure: COLONOSCOPY WITH PROPOFOL;  Surgeon: Lucilla Lame, MD;  Location: [place];  Service: Endoscopy;  Laterality: N/A;  . ESOPHAGOGASTRODUODENOSCOPY (EGD) WITH PROPOFOL N/A 01/08/2016   Procedure: ESOPHAGOGASTRODUODENOSCOPY (EGD) WITH PROPOFOL;  Surgeon:  Lucilla Lame, MD;  Location: [place];  Service: Endoscopy;  Laterality: N/A;  . MULTIPLE TOOTH EXTRACTIONS    . POLYPECTOMY  01/08/2016   Procedure: POLYPECTOMY;  Surgeon: Lucilla Lame, MD;  Location: [place];  Service: Endoscopy;;    OB History    No data available       Home Medications    Prior to Admission medications   Medication Sig Start Date End Date Taking? Authorizing Provider  albuterol (PROVENTIL HFA;VENTOLIN HFA) 108 (90 Base) MCG/ACT inhaler Inhale 2 puffs into the lungs every 6 (six) hours as needed for wheezing or shortness of breath. 03/17/16  Yes Delman Kitten, MD  pantoprazole (PROTONIX) 40 MG tablet Take 1 tablet (40 mg total) by mouth daily. 01/28/16  Yes Lucilla Lame, MD  cephALEXin (KEFLEX) 500 MG capsule Take 1 capsule (500 mg total) by mouth 2 (two) times daily. 02/13/16   Sherlene Shams, MD  cyclobenzaprine (FLEXERIL) 10 MG tablet Take 1 tablet (10 mg total) by mouth at bedtime. 09/22/16   Norval Gable, MD  HYDROcodone-acetaminophen (NORCO/VICODIN) 5-325 MG tablet 1-2 tabs po qd prn 09/22/16   Norval Gable, MD  ibuprofen (ADVIL,MOTRIN) 800 MG tablet Take 1 tablet (800 mg total) by mouth 3 (three) times daily. 09/22/16   Norval Gable, MD  mupirocin ointment (BACTROBAN) 2 % Apply 1 application topically 2 (two) times daily. 02/13/16   Sherlene Shams, MD    Family History Family History  Problem Relation Age of Onset  . COPD Mother   . Diabetes Father   . Heart disease Father     Social History Social History  Substance Use Topics  . Smoking status: Former Smoker    Packs/day: 1.00    Years: 30.00    Types: Cigarettes    Quit date: 12/26/2014  . Smokeless tobacco: Never Used  . Alcohol use Yes     Comment: 1 drink /2 weeks     Allergies   Patient has no known allergies.   Review of Systems Review of Systems  Constitutional: Negative for weight loss.  Gastrointestinal: Negative for bowel incontinence.  Genitourinary: Negative for  bladder incontinence.  Musculoskeletal: Positive for neck pain.  Neurological: Negative for tingling, numbness and headaches.     Physical Exam Triage Vital Signs ED Triage Vitals  Enc Vitals Group     BP 09/22/16 1211 (!) 162/84     Pulse Rate 09/22/16 1211 82     Resp 09/22/16 1211 16     Temp 09/22/16 1211 98 F (36.7 C)     Temp Source 09/22/16 1211 Oral     SpO2 09/22/16 1211 99 %     Weight 09/22/16 1214 117 lb (53.1 kg)     Height 09/22/16 1214 5\' 3"  (1.6 m)     Head Circumference --      Peak Flow --      Pain Score 09/22/16 1315 7     Pain Loc --      Pain Edu? --      Excl. in [place]? --    No data found.   Updated Vital Signs BP (!) 162/84 (BP Location: Left Arm)   Pulse 82   Temp 98 F (36.7 C) (Oral)   Resp 16   Ht 5\' 3"  (1.6 m)   Wt 117 lb (53.1 kg)   SpO2 99%   BMI 20.73 kg/m   Visual Acuity Right Eye Distance:   Left Eye Distance:   Bilateral Distance:    Right Eye Near:   Left Eye Near:    Bilateral Near:     Physical Exam  Constitutional: She is oriented to person, place, and time. She appears well-developed and well-nourished. No distress.  HENT:  Head: Normocephalic and atraumatic.  Eyes: EOM are normal. Pupils are equal, round, and reactive to light.  Neck: Normal range of motion. Neck supple. No tracheal deviation present. No thyromegaly present.  Musculoskeletal: She exhibits no edema.       Cervical back: She exhibits tenderness (over the left trapezius muscle and thoracic paraspinous muscles) and spasm. She exhibits normal range of motion, no bony tenderness, no swelling, no edema, no deformity, no laceration and normal pulse.       Back:  Lymphadenopathy:    She has no cervical adenopathy.  Neurological: She is alert and oriented to person, place, and time. She has normal reflexes. No cranial nerve deficit. She exhibits normal muscle tone. Coordination normal.  Skin: She is not diaphoretic.  Nursing note and vitals  reviewed.    UC Treatments / Results  Labs (all labs ordered are listed, but only abnormal results are displayed) Labs Reviewed - No data to display  EKG  EKG Interpretation None       Radiology No results found.  Procedures Procedures (including critical care time)  Medications Ordered in UC Medications - No data to display   Initial Impression / Assessment and Plan / UC Course  I  have reviewed the triage vital signs and the nursing notes.  Pertinent labs & imaging results that were available during my care of the patient were reviewed by me and considered in my medical decision making (see chart for details).       Final Clinical Impressions(s) / UC Diagnoses   Final diagnoses:  Thoracic myofascial strain, initial encounter    New Prescriptions Discharge Medication List as of 09/22/2016  1:13 PM    START taking these medications   Details  cyclobenzaprine (FLEXERIL) 10 MG tablet Take 1 tablet (10 mg total) by mouth at bedtime., Starting Wed 09/22/2016, Normal    ibuprofen (ADVIL,MOTRIN) 800 MG tablet Take 1 tablet (800 mg total) by mouth 3 (three) times daily., Starting Wed 09/22/2016, Normal       1. diagnosis reviewed with patient 2. rx as per orders above; reviewed possible side effects, interactions, risks and benefits  3. Recommend supportive treatment with heat to area, stretches 4. Follow-up prn if symptoms worsen or don't improve   Norval Gable, MD 09/22/16 1605
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
## 2017-08-26 IMAGING — CR DG CHEST 2V
2 series · 2 of 2 positions shown · non-contrast
Comparison: 11/24/2006

CLINICAL DATA: Cough and fever

EXAM:
CHEST  2 VIEW

[chest pa]
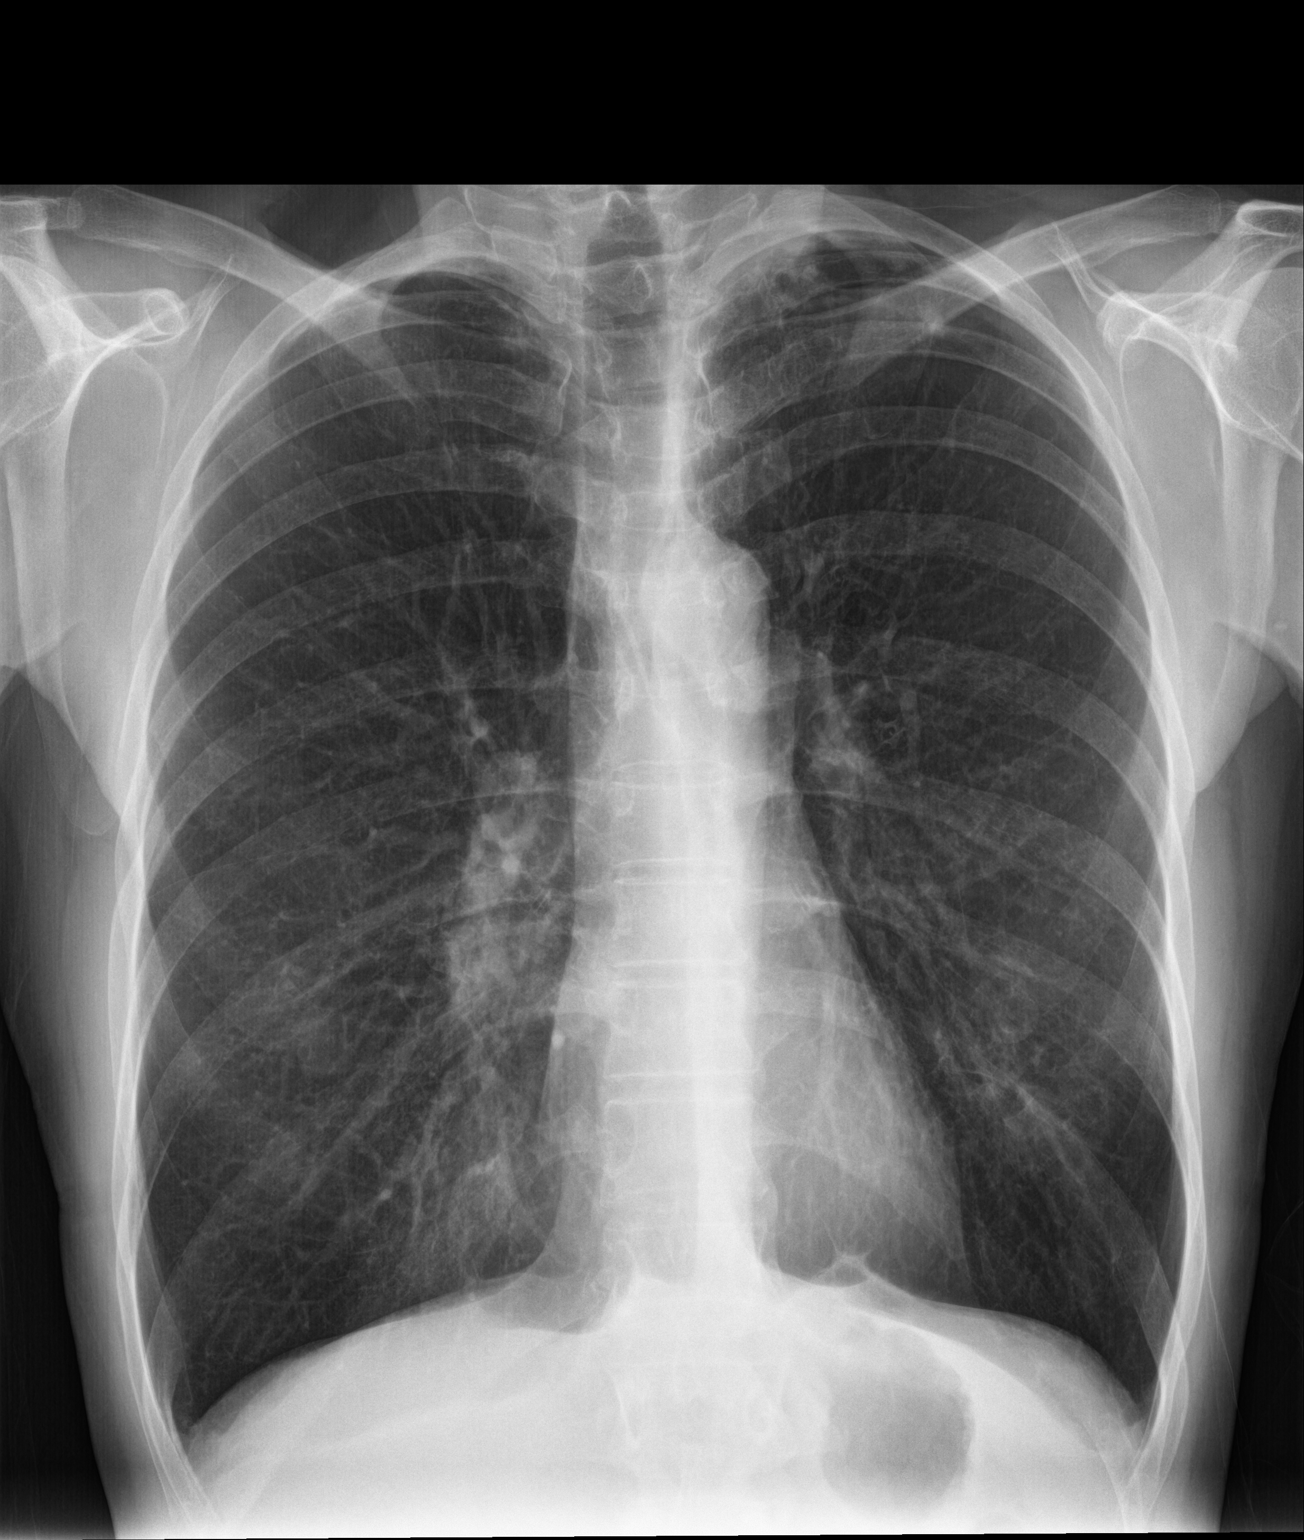

[chest lat]
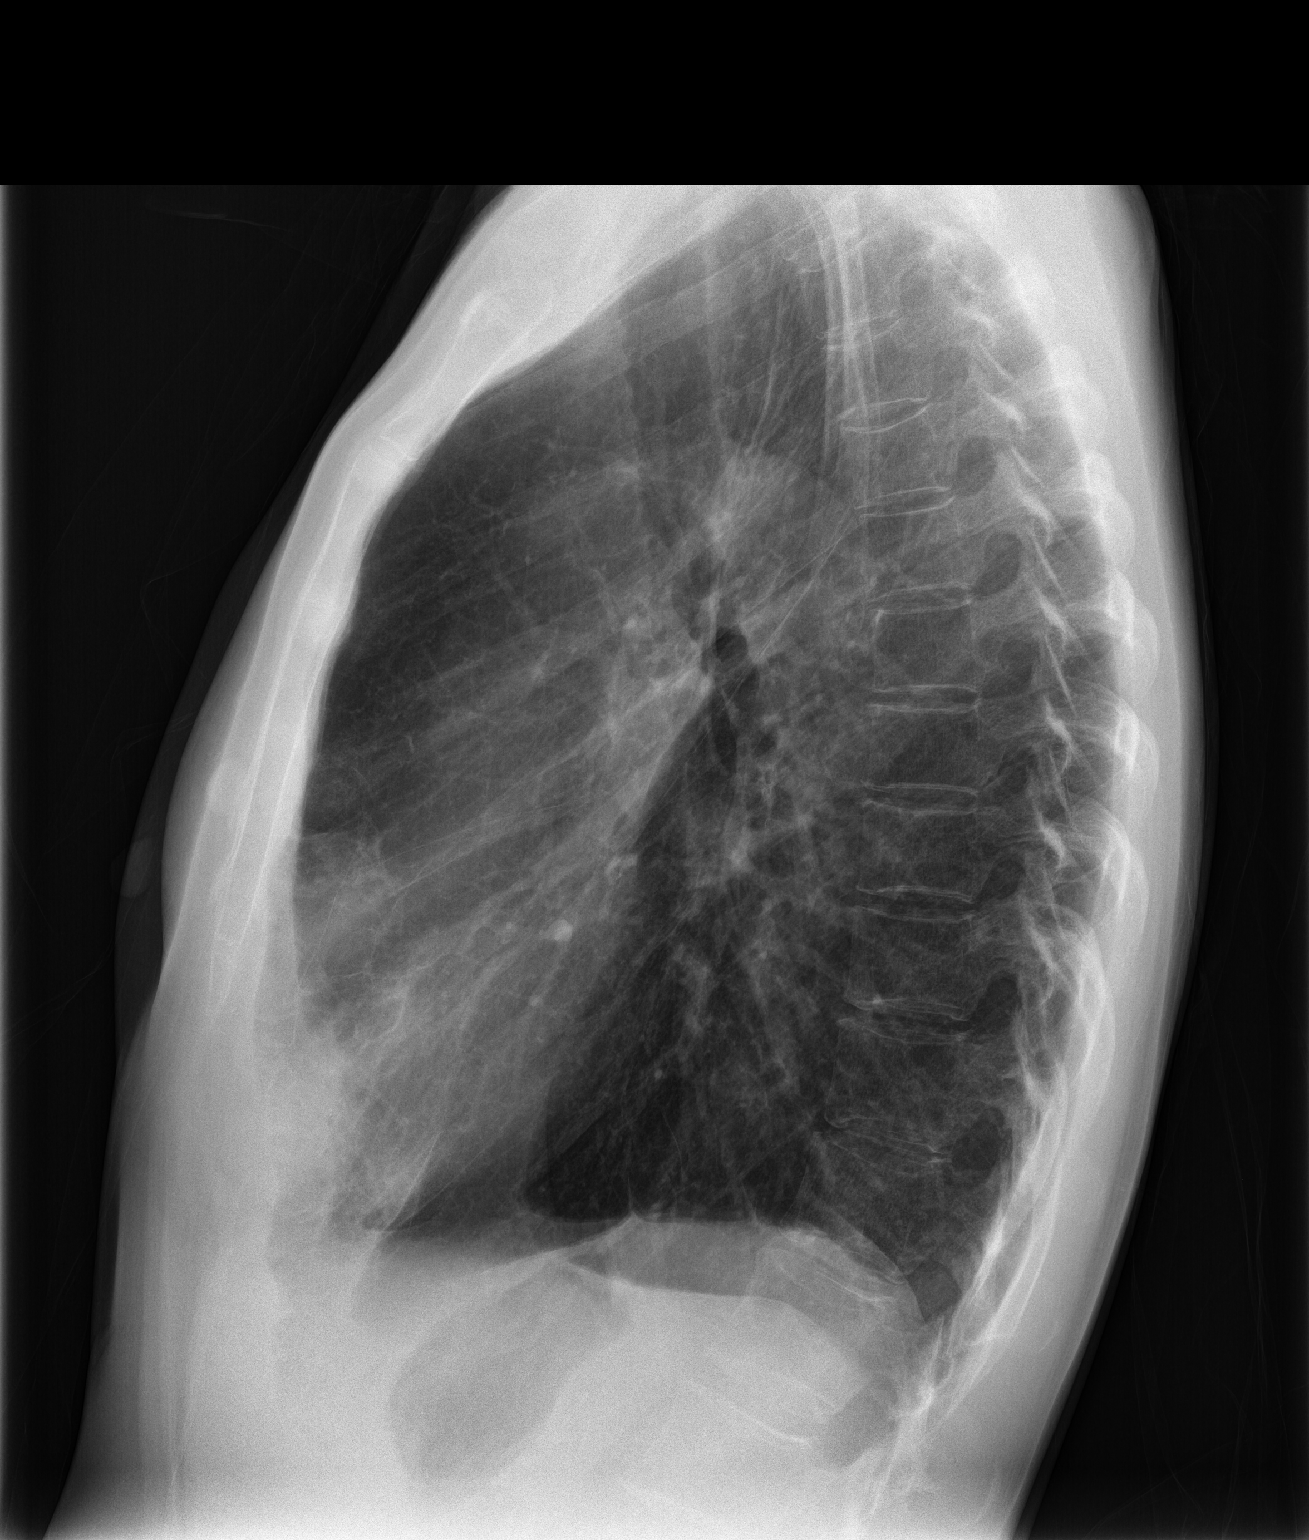

[2 of 2 positions shown; findings below may reference images not displayed]

FINDINGS: COPD with hyperinflation

Right middle lobe airspace disease has developed since the prior
study and is most consistent with pneumonia. No pleural effusion.

Apical scarring bilaterally.  Heart size and vascularity normal.
IMPRESSION: COPD with right middle lobe infiltrate consistent with pneumonia.

## 2018-04-17 ENCOUNTER — Encounter: Payer: Self-pay | Admitting: Emergency Medicine

## 2018-04-17 ENCOUNTER — Ambulatory Visit (INDEPENDENT_AMBULATORY_CARE_PROVIDER_SITE_OTHER): Payer: Self-pay

## 2018-04-17 ENCOUNTER — Ambulatory Visit
Admission: EM | Admit: 2018-04-17 | Discharge: 2018-04-17 | Disposition: A | Discharge status: Home / Self Care | Payer: Self-pay | Attending: Internal Medicine | Admitting: Internal Medicine

## 2018-04-17 ENCOUNTER — Other Ambulatory Visit: Payer: Self-pay

## 2018-04-17 DIAGNOSIS — Z87891 Personal history of nicotine dependence: Secondary | ICD-10-CM | POA: Insufficient documentation

## 2018-04-17 DIAGNOSIS — K219 Gastro-esophageal reflux disease without esophagitis: Secondary | ICD-10-CM | POA: Insufficient documentation

## 2018-04-17 DIAGNOSIS — R1013 Epigastric pain: Secondary | ICD-10-CM

## 2018-04-17 DIAGNOSIS — R05 Cough: Secondary | ICD-10-CM

## 2018-04-17 DIAGNOSIS — Z79899 Other long term (current) drug therapy: Secondary | ICD-10-CM | POA: Insufficient documentation

## 2018-04-17 DIAGNOSIS — R0602 Shortness of breath: Secondary | ICD-10-CM

## 2018-04-17 DIAGNOSIS — M1990 Unspecified osteoarthritis, unspecified site: Secondary | ICD-10-CM | POA: Insufficient documentation

## 2018-04-17 DIAGNOSIS — K2971 Gastritis, unspecified, with bleeding: Secondary | ICD-10-CM | POA: Insufficient documentation

## 2018-04-17 DIAGNOSIS — K2961 Other gastritis with bleeding: Secondary | ICD-10-CM

## 2018-04-17 DIAGNOSIS — J449 Chronic obstructive pulmonary disease, unspecified: Secondary | ICD-10-CM | POA: Insufficient documentation

## 2018-04-17 MED ORDER — DICYCLOMINE HCL 10 MG/5ML PO SOLN: 10.0000 mg | Freq: Once | ORAL | Status: DC

## 2018-04-17 MED ORDER — HYOSCYAMINE SULFATE 0.125 MG SL SUBL: 0.2500 mg | SUBLINGUAL_TABLET | Freq: Once | SUBLINGUAL | Status: DC

## 2018-04-17 MED ORDER — GI COCKTAIL ~~LOC~~
30.0000 mL | Freq: Once | ORAL | Status: AC
  Administered 2018-04-17: 30 mL via ORAL

## 2018-04-17 NOTE — ED Provider Notes (Signed)
[facility]    CSN: [id number] Arrival date & time: 04/17/18  0854     History   Chief Complaint Chief Complaint  Patient presents with  . Abdominal Pain   HPI Jill King is a 57 y.o. female.  Pt states she developed epigastric fullness 4 days ago and thought maybe it could be constipation, so she took something to empty out and did not make a difference. This has continued and is worse with eating and if area gets pressed. Pain is non radiating. Has trouble with food getting stuck in her esophagus. She was diagnosed with eosinophilic esophagitis 4967 and was placed on Protonix for this which she took for one year and ran out in 2018, but has been fine since. She does not know if she has had a fever, but has been having episodes of sweating. Has been taking protonix  In the past year, but this has not helped. She has had 2 episodes of  Sudden SOB at rest which lasted for 2 minutes. Denies chest pain or pressure or leg swelling or calf pain. She does not have a PCP. She quit smoking 2016. She denies N/V/D.   Past Medical History:  Diagnosis Date  . Arthritis    hands, wrist  . Asthma   . COPD (chronic obstructive pulmonary disease) ([place])   . Emphysema of lung ([place])   . GERD (gastroesophageal reflux disease)    Eosinophilic esophagitis 5916  . Wears dentures    full upper and lower    Patient Active Problem List   Diagnosis Date Noted  . Special screening for malignant neoplasms, colon   . Benign neoplasm of sigmoid colon   . Problems with swallowing and mastication   . COPD (chronic obstructive pulmonary disease) ([place]) 01/01/2016  . Asthma 01/01/2016  . GERD (gastroesophageal reflux disease) 01/01/2016    Past Surgical History:  Procedure Laterality Date  . CESAREAN SECTION    . COLONOSCOPY WITH PROPOFOL N/A 01/08/2016   Procedure: COLONOSCOPY WITH PROPOFOL;  Surgeon: Lucilla Lame, MD;  Location: [place];  Service: Endoscopy;  Laterality: N/A;  .  ESOPHAGOGASTRODUODENOSCOPY (EGD) WITH PROPOFOL N/A 01/08/2016   Procedure: ESOPHAGOGASTRODUODENOSCOPY (EGD) WITH PROPOFOL;  Surgeon: Lucilla Lame, MD;  Location: [place];  Service: Endoscopy;  Laterality: N/A;  . MULTIPLE TOOTH EXTRACTIONS    . POLYPECTOMY  01/08/2016   Procedure: POLYPECTOMY;  Surgeon: Lucilla Lame, MD;  Location: [place];  Service: Endoscopy;;    OB History   None      Home Medications    Prior to Admission medications   Medication Sig Start Date End Date Taking? Authorizing Provider  albuterol (PROVENTIL HFA;VENTOLIN HFA) 108 (90 Base) MCG/ACT inhaler Inhale 2 puffs into the lungs every 6 (six) hours as needed for wheezing or shortness of breath. 03/17/16  Yes Delman Kitten, MD  esomeprazole (NEXIUM) 40 MG capsule Take 40 mg by mouth daily at 12 noon.   Yes [provider]  ibuprofen (ADVIL,MOTRIN) 800 MG tablet Take 1 tablet (800 mg total) by mouth 3 (three) times daily. 09/22/16  Yes Norval Gable, MD    Family History Family History  Problem Relation Age of Onset  . COPD Mother   . Diabetes Father   . Heart disease Father     Social History Social History   Tobacco Use  . Smoking status: Former Smoker    Packs/day: 1.00    Years: 30.00    Pack years: 30.00  Types: Cigarettes    Last attempt to quit: 12/26/2014    Years since quitting: 3.3  . Smokeless tobacco: Never Used  Substance Use Topics  . Alcohol use: Yes    Comment: 1 drink /2 weeks  . Drug use: No     Allergies   Patient has no known allergies.   Review of Systems Review of Systems  Constitutional: Positive for appetite change and diaphoresis. Negative for chills, fever and unexpected weight change.  HENT: Positive for ear pain, postnasal drip, rhinorrhea and trouble swallowing. Negative for congestion and ear discharge.        From allergies  Respiratory: Positive for shortness of breath. Negative for cough and chest tightness.   Cardiovascular:  Negative for chest pain, palpitations and leg swelling.  Gastrointestinal: Positive for abdominal pain. Negative for abdominal distention, blood in stool, constipation, diarrhea, nausea and vomiting.       Denied black stools  Genitourinary: Negative for dysuria, flank pain, frequency and pelvic pain.  Musculoskeletal: Negative for arthralgias.  Skin: Negative for rash.  Neurological: Negative for dizziness.   Physical Exam Triage Vital Signs ED Triage Vitals [04/17/18 0906]  Enc Vitals Group     BP (!) 143/79     Pulse Rate 85     Resp 18     Temp 97.8 F (36.6 C)     Temp Source Oral     SpO2 97 %     Weight 115 lb (52.2 kg)     Height 5\' 6"  (1.676 m)     Head Circumference      Peak Flow      Pain Score 5     Pain Loc      Pain Edu?      Excl. in [place]?    No data found.  Updated Vital Signs BP (!) 147/84 (BP Location: Left Arm)   Pulse 85   Temp 97.8 F (36.6 C) (Oral)   Resp 16   Ht 5\' 6"  (1.676 m)   Wt 115 lb (52.2 kg)   SpO2 98%   BMI 18.56 kg/m   Visual Acuity Right Eye Distance:   Left Eye Distance:   Bilateral Distance:    Right Eye Near:   Left Eye Near:    Bilateral Near:     Physical Exam  Constitutional: She appears well-developed.  Non-toxic appearance. She does not appear ill. No distress.  HENT:  Head: Normocephalic.  Eyes: EOM are normal. No scleral icterus.  Cardiovascular: Normal rate and normal heart sounds.  No murmur heard. Pulmonary/Chest: Effort normal and breath sounds normal. She has no wheezes. She has no rales.  Abdominal: Normal appearance and bowel sounds are normal. She exhibits no pulsatile liver, no abdominal bruit and no pulsatile midline mass. There is no hepatosplenomegaly, splenomegaly or hepatomegaly. There is tenderness in the epigastric area. There is no rebound, no guarding and negative Murphy's sign. No hernia.  Skin: Skin is warm and dry.  Nursing note and vitals reviewed.  UC Treatments / Results  Labs (all  labs ordered are listed, but only abnormal results are displayed) Labs Reviewed - No data to display  EKG Shows 1st degree AV block, otherwise normal when compared with EKG from 03/17/16,    Radiology CXR neg acute disease.   Procedures GI cocktail given and this helped her epigastric discomfort. ED EKG Date/Time: 04/17/2018 10:04 AM Performed by: Shelby Mattocks, PA-C Authorized by: Shelby Mattocks, PA-C      Medications Ordered in  UC Medications  gi cocktail (Maalox,Lidocaine,Donnatal) (30 mLs Oral Given 04/17/18 1009)   Initial Impression / Assessment and Plan / UC Course  I have reviewed the triage vital signs and the nursing notes.  Pertinent labs results that were available during my care of the patient were reviewed by me  And Dr Alphonzo Cruise and considered in my medical decision making (see chart for details). I explained to pt she needs to see her GI Dr and needs to be scoped again. Since she does not have a PCP, may get established with Danella Sensing FNP and she can place the referral if needed. Also needs to make sure she gets a preventive physical and labs.  Needs to stay on Nexium, and consume soft foods, more pureed and drink ensure    Final Clinical Impressions(s) / UC Diagnoses   Final diagnoses:  Abdominal pain, epigastric  SOB (shortness of breath)  Other gastritis with bleeding, unspecified chronicity  Possible esophageal stricture.    Discharge Instructions     Continue taking the Nexium until you are able to see your stomach specialist.  Eat soft foods in the mean time.  Get established with a family practice provider soon Please STOP TAKING IBUPROFEN, this can make you worse.     ED Prescriptions    None     Controlled Substance Prescriptions [place] Controlled Substance Registry consulted? no   Shelby Mattocks, [place] 04/19/18 4259

## 2018-04-17 NOTE — Discharge Instructions (Addendum)
Continue taking the Nexium until you are able to see your stomach specialist.  Eat soft foods in the mean time.  Get established with a family practice provider soon Please STOP TAKING IBUPROFEN, this can make you worse.

## 2018-04-17 NOTE — ED Triage Notes (Signed)
Patient c/o mid upper abdominal pain that started last Thursday. She denies nausea, vomiting and diarrhea. Denies urinary symptoms. She states she was diagnosed with Eosinophilic esophagitis 2 years ago and is unsure if this pain is from this.

## 2018-12-18 ENCOUNTER — Encounter: Payer: Self-pay | Admitting: *Deleted

## 2018-12-18 ENCOUNTER — Other Ambulatory Visit: Payer: Self-pay

## 2018-12-18 DIAGNOSIS — F10920 Alcohol use, unspecified with intoxication, uncomplicated: Secondary | ICD-10-CM | POA: Insufficient documentation

## 2018-12-18 DIAGNOSIS — K625 Hemorrhage of anus and rectum: Secondary | ICD-10-CM | POA: Insufficient documentation

## 2018-12-18 DIAGNOSIS — Y908 Blood alcohol level of 240 mg/100 ml or more: Secondary | ICD-10-CM | POA: Insufficient documentation

## 2018-12-18 DIAGNOSIS — Z87891 Personal history of nicotine dependence: Secondary | ICD-10-CM | POA: Insufficient documentation

## 2018-12-18 DIAGNOSIS — J449 Chronic obstructive pulmonary disease, unspecified: Secondary | ICD-10-CM | POA: Insufficient documentation

## 2018-12-18 NOTE — ED Triage Notes (Signed)
Pt reports rectal bleeding since 2100 tonight.  No abd pain.  Pt reports bright red bleeding.  Last etoh was 2 days ago.  Pt alert.

## 2018-12-19 ENCOUNTER — Emergency Department
Admission: EM | Admit: 2018-12-19 | Discharge: 2018-12-19 | Disposition: A | Discharge status: Home / Self Care | Payer: Self-pay | Attending: Emergency Medicine | Admitting: Emergency Medicine

## 2018-12-19 DIAGNOSIS — K625 Hemorrhage of anus and rectum: Secondary | ICD-10-CM

## 2018-12-19 DIAGNOSIS — F10920 Alcohol use, unspecified with intoxication, uncomplicated: Secondary | ICD-10-CM

## 2018-12-19 LAB — CBC
HCT: 37.6 % (ref 36.0–46.0)
Hemoglobin: 13.2 g/dL (ref 12.0–15.0)
MCH: 31.4 pg (ref 26.0–34.0)
MCHC: 35.1 g/dL (ref 30.0–36.0)
MCV: 89.3 fL (ref 80.0–100.0)
Platelets: 231 10*3/uL (ref 150–400)
RBC: 4.21 MIL/uL (ref 3.87–5.11)
RDW: 11.5 % (ref 11.5–15.5)
WBC: 6.5 10*3/uL (ref 4.0–10.5)
nRBC: 0 % (ref 0.0–0.2)

## 2018-12-19 LAB — COMPREHENSIVE METABOLIC PANEL
ALT: 33 U/L (ref 0–44)
AST: 31 U/L (ref 15–41)
Albumin: 4.5 g/dL (ref 3.5–5.0)
Alkaline Phosphatase: 133 U/L — ABNORMAL HIGH (ref 38–126)
Anion gap: 12 (ref 5–15)
BUN: 9 mg/dL (ref 6–20)
CO2: 24 mmol/L (ref 22–32)
Calcium: 9.1 mg/dL (ref 8.9–10.3)
Chloride: 99 mmol/L (ref 98–111)
Creatinine, Ser: 0.46 mg/dL (ref 0.44–1.00)
GFR calc Af Amer: >60 mL/min (ref >60)
GFR calc non Af Amer: >60 mL/min (ref >60)
Glucose, Bld: 89 mg/dL (ref 70–99)
Potassium: 4.1 mmol/L (ref 3.5–5.1)
Sodium: 135 mmol/L (ref 135–145)
Total Bilirubin: 0.3 mg/dL (ref 0.3–1.2)
Total Protein: 7.6 g/dL (ref 6.5–8.1)

## 2018-12-19 LAB — ETHANOL: Alcohol, Ethyl (B): 246 mg/dL — ABNORMAL HIGH (ref <10)

## 2018-12-19 MED ORDER — SODIUM CHLORIDE 0.9 % IV BOLUS
1000.0000 mL | Freq: Once | INTRAVENOUS | Status: AC
  Administered 2018-12-19: 1000 mL via INTRAVENOUS

## 2018-12-19 MED ORDER — FAMOTIDINE IN NACL 20-0.9 MG/50ML-% IV SOLN
20.0000 mg | Freq: Once | INTRAVENOUS | Status: AC
  Administered 2018-12-19: 20 mg via INTRAVENOUS
  Filled 2018-12-19: qty 50

## 2018-12-19 NOTE — Discharge Instructions (Signed)
1.  You may start Pepcid in addition to taking your Nexium every day. 2.  Avoid aspirin, ibuprofen type products.  Drink alcohol in moderation. 3.  Return to the ER for worsening symptoms, persistent vomiting, heavy bleeding or other concerns.

## 2018-12-19 NOTE — ED Provider Notes (Signed)
[facility] Emergency Department Provider Note   ____________________________________________   First MD Initiated Contact with Patient 12/19/18 0410     (approximate)  I have reviewed the triage vital signs and the nursing notes.   HISTORY  Chief Complaint Rectal Bleeding    HPI Jill King is a 58 y.o. female who presents to the ED from home with a chief complaint of rectal bleeding.  Patient noted bright red blood per rectum approximately 9 PM.  Denies associated abdominal pain.  Reports last EtOH was 2 days ago and that she is not a daily drinker.  Takes an occasional Goody powder but does not take daily NSAIDs or aspirin products.  Denies fever, cough, chest pain, shortness of breath, nausea, vomiting, dizziness or fainting.  Denies recent travel, trauma or exposure to persons diagnosed with coronavirus.  Denies use of anticoagulants.      Past Medical History:  Diagnosis Date  . Arthritis    hands, wrist  . Asthma   . COPD (chronic obstructive pulmonary disease) ([place])   . Emphysema of lung ([place])   . GERD (gastroesophageal reflux disease)    Eosinophilic esophagitis 8144  . Wears dentures    full upper and lower    Patient Active Problem List   Diagnosis Date Noted  . Special screening for malignant neoplasms, colon   . Benign neoplasm of sigmoid colon   . Problems with swallowing and mastication   . COPD (chronic obstructive pulmonary disease) ([place]) 01/01/2016  . Asthma 01/01/2016  . GERD (gastroesophageal reflux disease) 01/01/2016    Past Surgical History:  Procedure Laterality Date  . CESAREAN SECTION    . COLONOSCOPY WITH PROPOFOL N/A 01/08/2016   Procedure: COLONOSCOPY WITH PROPOFOL;  Surgeon: Lucilla Lame, MD;  Location: [place];  Service: Endoscopy;  Laterality: N/A;  . ESOPHAGOGASTRODUODENOSCOPY (EGD) WITH PROPOFOL N/A 01/08/2016   Procedure: ESOPHAGOGASTRODUODENOSCOPY (EGD) WITH PROPOFOL;  Surgeon: Lucilla Lame, MD;   Location: [place];  Service: Endoscopy;  Laterality: N/A;  . MULTIPLE TOOTH EXTRACTIONS    . POLYPECTOMY  01/08/2016   Procedure: POLYPECTOMY;  Surgeon: Lucilla Lame, MD;  Location: [place];  Service: Endoscopy;;    Prior to Admission medications   Medication Sig Start Date End Date Taking? Authorizing Provider  albuterol (PROVENTIL HFA;VENTOLIN HFA) 108 (90 Base) MCG/ACT inhaler Inhale 2 puffs into the lungs every 6 (six) hours as needed for wheezing or shortness of breath. 03/17/16   Delman Kitten, MD  esomeprazole (NEXIUM) 40 MG capsule Take 40 mg by mouth daily at 12 noon.    [provider]  ibuprofen (ADVIL,MOTRIN) 800 MG tablet Take 1 tablet (800 mg total) by mouth 3 (three) times daily. 09/22/16   Norval Gable, MD    Allergies Patient has no known allergies.  Family History  Problem Relation Age of Onset  . COPD Mother   . Diabetes Father   . Heart disease Father     Social History Social History   Tobacco Use  . Smoking status: Former Smoker    Packs/day: 1.00    Years: 30.00    Pack years: 30.00    Types: Cigarettes    Quit date: 12/26/2014    Years since quitting: 3.9  . Smokeless tobacco: Never Used  Substance Use Topics  . Alcohol use: Yes    Comment: 1 drink /2 weeks  . Drug use: No    Review of Systems  Constitutional: No fever/chills Eyes: No visual changes. ENT:  No sore throat. Cardiovascular: Denies chest pain. Respiratory: Denies shortness of breath. Gastrointestinal: Positive for rectal bleeding.  No abdominal pain.  No nausea, no vomiting.  No diarrhea.  No constipation. Genitourinary: Negative for dysuria. Musculoskeletal: Negative for back pain. Skin: Negative for rash. Neurological: Negative for headaches, focal weakness or numbness.   ____________________________________________   PHYSICAL EXAM:  VITAL SIGNS: ED Triage Vitals  Enc Vitals Group     BP 12/18/18 2346 (!) 156/88     Pulse Rate 12/18/18  2346 79     Resp 12/18/18 2346 16     Temp 12/18/18 2346 98.6 F (37 C)     Temp Source 12/18/18 2346 Oral     SpO2 12/18/18 2346 98 %     Weight 12/18/18 2346 115 lb (52.2 kg)     Height 12/18/18 2346 5\' 5"  (1.651 m)     Head Circumference --      Peak Flow --      Pain Score 12/18/18 2354 0     Pain Loc --      Pain Edu? --      Excl. in [place]? --     Constitutional: Alert and oriented. Cachecticl appearing and in no acute distress. Eyes: Conjunctivae are normal. PERRL. EOMI. Head: Atraumatic. Nose: No congestion/rhinnorhea. Mouth/Throat: Mucous membranes are moist.  Oropharynx non-erythematous. Neck: No stridor.   Cardiovascular: Normal rate, regular rhythm. Grossly normal heart sounds.  Good peripheral circulation. Respiratory: Normal respiratory effort.  No retractions. Lungs CTAB. Gastrointestinal: Soft and nontender to light or deep palpation. No distention. No abdominal bruits. No CVA tenderness. Musculoskeletal: No lower extremity tenderness nor edema.  No joint effusions. Neurologic:  Normal speech and language. No gross focal neurologic deficits are appreciated. No gait instability. Skin:  Skin is warm, dry and intact. No rash noted. Psychiatric: Mood and affect are normal. Speech and behavior are normal.  ____________________________________________   LABS (all labs ordered are listed, but only abnormal results are displayed)  Labs Reviewed  COMPREHENSIVE METABOLIC PANEL - Abnormal; Notable for the following components:      Result Value   Alkaline Phosphatase 133 (*)    All other components within normal limits  ETHANOL - Abnormal; Notable for the following components:   Alcohol, Ethyl (B) 246 (*)    All other components within normal limits  CBC   ____________________________________________  EKG  None ____________________________________________  RADIOLOGY  ED MD interpretation: None  Official radiology report(s): No results found.   ____________________________________________   PROCEDURES  Procedure(s) performed (including Critical Care):  Procedures  Rectal exam: Small external hemorrhoid which is nonthrombosed and nonbleeding.  Minimal tan stool obtained on gloved finger which is guaiac negative. ____________________________________________   INITIAL IMPRESSION / ASSESSMENT AND PLAN / ED COURSE  As part of my medical decision making, I reviewed the following data within the [place] notes reviewed and incorporated, Labs reviewed, Old chart reviewed and Notes from prior ED visits     Jill King was evaluated in Emergency Department on 12/19/2018 for the symptoms described in the history of present illness. She was evaluated in the context of the global COVID-19 pandemic, which necessitated consideration that the patient might be at risk for infection with the SARS-CoV-2 virus that causes COVID-19. Institutional protocols and algorithms that pertain to the evaluation of patients at risk for COVID-19 are in a state of rapid change based on information released by regulatory bodies including the CDC and federal and state organizations. These  policies and algorithms were followed during the patient's care in the ED.   58 year old female who presents with rectal bleeding.  Differential diagnosis includes but is not limited to hemorrhoids, anal fissure, GERD, PUD, diverticular bleed, etc.  Patient is hemodynamically stable, no tachycardia with normal H/H.  Noted elevated EtOH level.  Will initiate IV fluid resuscitation, IV Pepcid and reassess.  Clinical Course as of Dec 19 514  Tue Dec 19, 2018  0516 Patient eager for discharge as her ride has to leave to go to work.  Voicing no complaints.  Strict return precautions given.  Patient verbalizes understanding agrees with plan of care.   [JS]    Clinical Course User Index [JS] Paulette Blanch, MD      ____________________________________________   FINAL CLINICAL IMPRESSION(S) / ED DIAGNOSES  Final diagnoses:  Rectal bleeding  Alcoholic intoxication without complication [facility])     ED Discharge Orders    None       Note:  This document was prepared using Dragon voice recognition software and may include unintentional dictation errors.   Paulette Blanch, MD 12/19/18 [phone]

## 2019-01-11 ENCOUNTER — Encounter: Payer: Self-pay | Admitting: Gastroenterology

## 2019-01-11 ENCOUNTER — Ambulatory Visit (INDEPENDENT_AMBULATORY_CARE_PROVIDER_SITE_OTHER): Payer: Self-pay | Admitting: Gastroenterology

## 2019-01-11 ENCOUNTER — Other Ambulatory Visit: Payer: Self-pay

## 2019-01-11 DIAGNOSIS — R1084 Generalized abdominal pain: Secondary | ICD-10-CM

## 2019-01-11 NOTE — Progress Notes (Signed)
Primary Care Physician: Patient, No Pcp Per  Primary Gastroenterologist:  Dr. Lucilla Lame  Chief Complaint  Patient presents with  . Rectal Bleeding    HPI: Jill King is a 58 y.o. female here with abdominal bloating and constipation.  The patient has had a colonoscopy and upper endoscopy by me in the past and was found to have eosinophilic esophagitis.  The patient had been in the ER recently for a large amount of rectal bleeding.  At that time the patient's alcohol level was over 200.  She reports that she only drinks socially on the weekend.  The patient's colonoscopy showed her to have hemorrhoids and diverticulosis.  She states that her abdomen feels more distended than it has in the past.  She denies any dysphasia.  She endorses having to take a laxative quite often to move her bowels.  Current Outpatient Medications  Medication Sig Dispense Refill  . albuterol (PROVENTIL HFA;VENTOLIN HFA) 108 (90 Base) MCG/ACT inhaler Inhale 2 puffs into the lungs every 6 (six) hours as needed for wheezing or shortness of breath. 1 Inhaler 2  . esomeprazole (NEXIUM) 40 MG capsule Take 40 mg by mouth daily at 12 noon.    . famotidine (PEPCID) 20 MG tablet Take 20 mg by mouth daily.    Marland Kitchen ibuprofen (ADVIL,MOTRIN) 800 MG tablet Take 1 tablet (800 mg total) by mouth 3 (three) times daily. (Patient not taking: Reported on 01/11/2019) 21 tablet 0   No current facility-administered medications for this visit.     Allergies as of 01/11/2019  . (No Known Allergies)    ROS:  General: Negative for anorexia, weight loss, fever, chills, fatigue, weakness. ENT: Negative for hoarseness, difficulty swallowing , nasal congestion. CV: Negative for chest pain, angina, palpitations, dyspnea on exertion, peripheral edema.  Respiratory: Negative for dyspnea at rest, dyspnea on exertion, cough, sputum, wheezing.  GI: See history of present illness. GU:  Negative for dysuria, hematuria, urinary incontinence,  urinary frequency, nocturnal urination.  Endo: Negative for unusual weight change.    Physical Examination:   BP (!) 162/79   Pulse 85   Temp 99 F (37.2 C) (Oral)   Ht 5\' 5"  (1.651 m)   Wt 119 lb 12.8 oz (54.3 kg)   BMI 19.94 kg/m   General: Well-nourished, well-developed in no acute distress.  Eyes: No icterus. Conjunctivae pink. Lungs: Clear to auscultation bilaterally. Non-labored. Heart: Regular rate and rhythm, no murmurs rubs or gallops.  Abdomen: Bowel sounds are normal, nontender, nondistended, no hepatosplenomegaly or masses, no abdominal bruits or hernia , no rebound or guarding.   Extremities: No lower extremity edema. No clubbing or deformities. Neuro: Alert and oriented x 3.  Grossly intact. Skin: Warm and dry, no jaundice.   Psych: Alert and cooperative, normal mood and affect.  Labs:    Imaging Studies: No results found.  Assessment and Plan:   Jill King is a 57 y.o. y/o female with a history of eosinophilic esophagitis who is not having any problem swallowing at present time.  She now reports that she has worsening constipation and abdominal distention with tenderness.  The patient rectal bleeding was most likely due to hemorrhoidal bleeding from her chronic constipation or diverticular bleed.  The bleeding has stopped and I have told the patient that if it returns she should call me.  As for her abdominal distention and tenderness she will be set up for a CT scan of the abdomen and pelvis.  The patient has been  explained the plan and agrees with it.    Lucilla Lame, MD. [name]   Note: This dictation was prepared with Dragon dictation along with smaller phrase technology. Any transcriptional errors that result from this process are unintentional.

## 2019-01-11 NOTE — Patient Instructions (Signed)
You are scheduled for a CT scan abomen/pelvis at [facility] outpatient imaging, [facility] LOCATION on Wednesday, July 22nd at 3:00pm. Please arrive at the registration desk at 2:45pm. You cannot have anything to eat or drink 4 hours prior.  If you need to reschedule this appointment for any reason, please contact central scheduling at [phone].

## 2019-01-17 ENCOUNTER — Other Ambulatory Visit: Payer: Self-pay

## 2019-01-17 ENCOUNTER — Ambulatory Visit
Admission: RE | Admit: 2019-01-17 | Discharge: 2019-01-17 | Disposition: A | Discharge status: Home / Self Care | Payer: Self-pay | Source: Ambulatory Visit | Attending: Gastroenterology | Admitting: Gastroenterology

## 2019-01-17 DIAGNOSIS — R1084 Generalized abdominal pain: Secondary | ICD-10-CM | POA: Insufficient documentation

## 2019-01-17 MED ORDER — IOHEXOL 300 MG/ML  SOLN
75.0000 mL | Freq: Once | INTRAMUSCULAR | Status: AC | PRN
  Administered 2019-01-17: 75 mL via INTRAVENOUS

## 2019-01-23 ENCOUNTER — Telehealth: Payer: Self-pay

## 2019-01-23 NOTE — Telephone Encounter (Signed)
-----   Message from Lucilla Lame, MD sent at 01/18/2019 11:11 AM EDT ----- Let the patient know that the CT scan of the abd and pelvis did not show anything to explain her symptoms.

## 2019-01-23 NOTE — Telephone Encounter (Signed)
Pt notified of CT scan results. Pt stated her abdominal pain is still there and would like to know what else we can do. Please advise.

## 2019-01-24 ENCOUNTER — Other Ambulatory Visit: Payer: Self-pay

## 2019-01-24 NOTE — Telephone Encounter (Signed)
Since the patients only finding has been constipation, lets get her on a program to move her bowels better. Linzess 175mcg a day then add miralax as needed daily.

## 2019-01-24 NOTE — Progress Notes (Signed)
Pt notified of Dr. Dorothey Baseman recommendation of trying Linzess 158mcg. Pt currently has no insurance. Pt will pick up samples of this medication. If this works for her, we will work on getting her pt assistance.

## 2019-02-08 ENCOUNTER — Emergency Department
Admission: EM | Admit: 2019-02-08 | Discharge: 2019-02-08 | Disposition: A | Discharge status: Home / Self Care | Payer: Self-pay | Attending: Emergency Medicine | Admitting: Emergency Medicine

## 2019-02-08 ENCOUNTER — Other Ambulatory Visit: Payer: Self-pay

## 2019-02-08 ENCOUNTER — Emergency Department: Payer: Self-pay

## 2019-02-08 DIAGNOSIS — S63502A Unspecified sprain of left wrist, initial encounter: Secondary | ICD-10-CM | POA: Insufficient documentation

## 2019-02-08 DIAGNOSIS — Y9301 Activity, walking, marching and hiking: Secondary | ICD-10-CM | POA: Insufficient documentation

## 2019-02-08 DIAGNOSIS — Y998 Other external cause status: Secondary | ICD-10-CM | POA: Insufficient documentation

## 2019-02-08 DIAGNOSIS — M25532 Pain in left wrist: Secondary | ICD-10-CM

## 2019-02-08 DIAGNOSIS — J449 Chronic obstructive pulmonary disease, unspecified: Secondary | ICD-10-CM | POA: Insufficient documentation

## 2019-02-08 DIAGNOSIS — W010XXA Fall on same level from slipping, tripping and stumbling without subsequent striking against object, initial encounter: Secondary | ICD-10-CM | POA: Insufficient documentation

## 2019-02-08 DIAGNOSIS — Z87891 Personal history of nicotine dependence: Secondary | ICD-10-CM | POA: Insufficient documentation

## 2019-02-08 DIAGNOSIS — Z79899 Other long term (current) drug therapy: Secondary | ICD-10-CM | POA: Insufficient documentation

## 2019-02-08 DIAGNOSIS — Y92019 Unspecified place in single-family (private) house as the place of occurrence of the external cause: Secondary | ICD-10-CM | POA: Insufficient documentation

## 2019-02-08 MED ORDER — HYDROCODONE-ACETAMINOPHEN 5-325 MG PO TABS
1.0000 | ORAL_TABLET | Freq: Once | ORAL | Status: AC
  Administered 2019-02-08: 1 via ORAL
  Filled 2019-02-08: qty 1

## 2019-02-08 MED ORDER — HYDROCODONE-ACETAMINOPHEN 5-325 MG PO TABS: 1.0000 | ORAL_TABLET | Freq: Three times a day (TID) | ORAL | 0 refills | Status: AC | PRN

## 2019-02-08 NOTE — ED Provider Notes (Signed)
[facility] Emergency Department Provider Note ____________________________________________  Time seen: 2324  I have reviewed the triage vital signs and the nursing notes.  HISTORY  Chief Complaint  Wrist Pain  HPI Jill King is a 58 y.o.right-handed female presents to the ED for evaluation of injury sustained following mechanical fall.  Patient describes slipping on conditioner on the floor in her home, she describes falling on outstretched hand, and has pain to the left wrist primarily.  She denies a head injury, loss of consciousness, nausea, vomiting, chest pain.  Localizes pain to the ulnar aspect of her left wrist.  Past Medical History:  Diagnosis Date  . Arthritis    hands, wrist  . Asthma   . COPD (chronic obstructive pulmonary disease) ([place])   . Emphysema of lung ([place])   . GERD (gastroesophageal reflux disease)    Eosinophilic esophagitis 6144  . Wears dentures    full upper and lower    Patient Active Problem List   Diagnosis Date Noted  . Special screening for malignant neoplasms, colon   . Benign neoplasm of sigmoid colon   . Problems with swallowing and mastication   . COPD (chronic obstructive pulmonary disease) ([place]) 01/01/2016  . Asthma 01/01/2016  . GERD (gastroesophageal reflux disease) 01/01/2016    Past Surgical History:  Procedure Laterality Date  . CESAREAN SECTION    . COLONOSCOPY WITH PROPOFOL N/A 01/08/2016   Procedure: COLONOSCOPY WITH PROPOFOL;  Surgeon: Lucilla Lame, MD;  Location: [place];  Service: Endoscopy;  Laterality: N/A;  . ESOPHAGOGASTRODUODENOSCOPY (EGD) WITH PROPOFOL N/A 01/08/2016   Procedure: ESOPHAGOGASTRODUODENOSCOPY (EGD) WITH PROPOFOL;  Surgeon: Lucilla Lame, MD;  Location: [place];  Service: Endoscopy;  Laterality: N/A;  . MULTIPLE TOOTH EXTRACTIONS    . POLYPECTOMY  01/08/2016   Procedure: POLYPECTOMY;  Surgeon: Lucilla Lame, MD;  Location: [place];  Service: Endoscopy;;     Prior to Admission medications   Medication Sig Start Date End Date Taking? Authorizing Provider  albuterol (PROVENTIL HFA;VENTOLIN HFA) 108 (90 Base) MCG/ACT inhaler Inhale 2 puffs into the lungs every 6 (six) hours as needed for wheezing or shortness of breath. 03/17/16   Delman Kitten, MD  esomeprazole (NEXIUM) 40 MG capsule Take 40 mg by mouth daily at 12 noon.    [provider]  HYDROcodone-acetaminophen (NORCO) 5-325 MG tablet Take 1 tablet by mouth 3 (three) times daily as needed for up to 3 days. 02/08/19 02/11/19  Lilyth Lawyer, Dannielle Karvonen, PA-C    Allergies Patient has no known allergies.  Family History  Problem Relation Age of Onset  . COPD Mother   . Diabetes Father   . Heart disease Father     Social History Social History   Tobacco Use  . Smoking status: Former Smoker    Packs/day: 1.00    Years: 30.00    Pack years: 30.00    Types: Cigarettes    Quit date: 12/26/2014    Years since quitting: 4.1  . Smokeless tobacco: Never Used  Substance Use Topics  . Alcohol use: Yes    Comment: 1 drink /2 weeks  . Drug use: No    Review of Systems  Constitutional: Negative for fever. Eyes: Negative for visual changes. ENT: Negative for sore throat. Cardiovascular: Negative for chest pain. Respiratory: Negative for shortness of breath. Gastrointestinal: Negative for abdominal pain, vomiting and diarrhea. Genitourinary: Negative for dysuria. Musculoskeletal: Negative for back pain.  Left wrist pain as above. Skin: Negative for rash.  Neurological: Negative for headaches, focal weakness or numbness. ____________________________________________  PHYSICAL EXAM:  VITAL SIGNS: ED Triage Vitals [02/08/19 2244]  Enc Vitals Group     BP (!) 149/119     Pulse Rate 99     Resp 18     Temp 98.2 F (36.8 C)     Temp src      SpO2 100 %     Weight 119 lb (54 kg)     Height 5\' 3"  (1.6 m)     Head Circumference      Peak Flow      Pain Score 10     Pain Loc       Pain Edu?      Excl. in [place]?     Constitutional: Alert and oriented. Well appearing and in no distress. Head: Normocephalic and atraumatic. Eyes: Conjunctivae are normal. Normal extraocular movements Neck: Supple. No thyromegaly. Cardiovascular: Normal rate, regular rhythm. Normal distal pulses. Respiratory: Normal respiratory effort. No wheezes/rales/rhonchi. Musculoskeletal: Nontender with normal range of motion in all extremities.  Neurologic:  Normal gait without ataxia. Normal speech and language. No gross focal neurologic deficits are appreciated. Skin:  Skin is warm, dry and intact. No rash noted. Psychiatric: Mood and affect are normal. Patient exhibits appropriate insight and judgment. __________________________   RADIOLOGY  DG Left Wrist  Negative  I, Keia Rask V Bacon-Yulian Gosney, personally viewed and evaluated these images (plain radiographs) as part of my medical decision making, as well as reviewing the written report by the radiologist. ____________________________________________  PROCEDURES  Procedures Norco 5-325 mg PO Velcro Wrist Cock-up Sling ____________________________________________  INITIAL IMPRESSION / ASSESSMENT AND PLAN / ED COURSE  Jill King was evaluated in Emergency Department on 02/08/2019 for the symptoms described in the history of present illness. She was evaluated in the context of the global COVID-19 pandemic, which necessitated consideration that the patient might be at risk for infection with the SARS-CoV-2 virus that causes COVID-19. Institutional protocols and algorithms that pertain to the evaluation of patients at risk for COVID-19 are in a state of rapid change based on information released by regulatory bodies including the CDC and federal and state organizations. These policies and algorithms were followed during the patient's care in the ED.  Presents to the ED for evaluation of injury sustained following mechanical fall.  She  complains of left wrist pain after slip and fall.  X-rays negative for any acute fracture or dislocation.  She does have osteopenia and reports tenderness to the ulnar aspect of the wrist.  She is placed in a wrist cock-up splint for support port and given a sling for comfort.  A prescription for #9 hydrocodone is provided for pain relief.  She is referred to Ortho for ongoing symptoms.  Work is provided for 1 day.  I reviewed the patient's prescription history over the last 12 months in the multi-state controlled substances database(s) that includes [place], [place], [place], [place], [place], [place], [place], [place], [place], [place], [place], [place], [place], and [place].  Results were notable for no RX history. ____________________________________________  FINAL CLINICAL IMPRESSION(S) / ED DIAGNOSES  Final diagnoses:  Left wrist pain  Sprain of left wrist, initial encounter      Melvenia Needles, PA-C 02/08/19 2344    Arta Silence, MD 02/08/19 2350

## 2019-02-08 NOTE — ED Triage Notes (Addendum)
Pt comes via POV from home with c/o left wrist pain. Pt states she slipped on some conditioner and fell in the kitchen and tried to catch herself. Pt states pain to bottom and left shoulder.  Pt states severe pain and unable to move wrist or fingers. Pt tearful in triage.  Pt denies LOC or hitting head.  Pt has swelling to left wrist. No obvious deformity

## 2019-02-08 NOTE — Discharge Instructions (Signed)
Your exam and XR do not reveal a fracture to the wrist. You should wear the splint and sling as needed for support. Take pain medicine as needed and OTC Ibuprofen for pain & inflammation. Follow-up with Dr. Rudene Christians for ongoing wrist pain.

## 2019-02-19 ENCOUNTER — Telehealth: Payer: Self-pay | Admitting: Gastroenterology

## 2019-02-19 ENCOUNTER — Other Ambulatory Visit: Payer: Self-pay

## 2019-02-19 MED ORDER — LINACLOTIDE 145 MCG PO CAPS: 145.0000 ug | ORAL_CAPSULE | Freq: Every day | ORAL | 5 refills | Status: DC

## 2019-02-19 NOTE — Telephone Encounter (Signed)
Rx for Linzess 118mcg has been sent to pt's pharmacy.

## 2019-02-19 NOTE — Telephone Encounter (Signed)
Elmyra Ricks pt daughter is calling regarding pt she was given samples of Rx Linzess she would like to get more samples or prescription called in to [facility] in [place] please call Elmyra Ricks [phone]

## 2019-03-20 ENCOUNTER — Other Ambulatory Visit: Payer: Self-pay

## 2019-03-20 ENCOUNTER — Encounter (INDEPENDENT_AMBULATORY_CARE_PROVIDER_SITE_OTHER): Payer: Self-pay

## 2019-03-20 ENCOUNTER — Ambulatory Visit: Payer: Self-pay | Admitting: Pharmacy Technician

## 2019-03-20 DIAGNOSIS — Z79899 Other long term (current) drug therapy: Secondary | ICD-10-CM

## 2019-03-20 NOTE — Progress Notes (Signed)
Completed financial assistance application for [place] due to recent hospital visit. Patient agreed to be responsible for gathering financial information and forwarding to appropriate department in [facility].    Completed [facility] application and contract.  Patient agreed to all terms of the [facility] contract.    Patient approved to receive medication assistance at [facility] as long as eligibility criteria continues to be met.    Provided patient with community resource material based on her particular needs.    Linzess Prescription Application completed with patient.  Forwarded to Dr. Allen Norris for signature.  Upon receipt of signed application from provider, Linzess Prescription Application will be submitted to pharmaceutical company.  [facility]

## 2019-04-16 ENCOUNTER — Telehealth: Payer: Self-pay | Admitting: Pharmacist

## 2019-04-16 NOTE — Telephone Encounter (Signed)
04/16/2019 11:03:05 AM - Linzess faxed to [facility]  04/16/2019 Faxed [employer] application for Linzess 145 mcg Take one capsule by mouth once every day, request they ship to our address not Dr. Lucilla Lame [place], [place] .Delos Haring

## 2019-07-10 ENCOUNTER — Telehealth: Payer: Self-pay | Admitting: Pharmacist

## 2019-07-10 NOTE — Telephone Encounter (Signed)
07/10/2019 1:24:33 PM - Call to [place] [facility] [place]  07/10/2019 Received a note to call on Linzess we have not received--Called spoke with Alexis--according to her records this medication has not been shipped due to [facility] is not the registered address with Dr. Oliver Barre. She stated someone called back in Oct 2020 & left a message-I have not received a message but # on application is [phone]- and no message was given to me. I explained that the address registered to license is [address] [place], she stated they will have to ship to that address, she is requesting this be expedited for shipping, the order# [id number]. I have marked to check on 07/29/2019-I will have to call the provider office.Delos Haring

## 2019-07-11 ENCOUNTER — Ambulatory Visit: Payer: Self-pay | Attending: Internal Medicine

## 2019-07-11 DIAGNOSIS — Z20822 Contact with and (suspected) exposure to covid-19: Secondary | ICD-10-CM | POA: Insufficient documentation

## 2019-07-12 LAB — NOVEL CORONAVIRUS, NAA: SARS-CoV-2, NAA: NOT DETECTED

## 2019-07-24 ENCOUNTER — Other Ambulatory Visit: Payer: Self-pay

## 2019-07-24 MED ORDER — LINACLOTIDE 145 MCG PO CAPS: 145.0000 ug | ORAL_CAPSULE | Freq: Every day | ORAL | 11 refills | Status: DC

## 2019-08-01 ENCOUNTER — Telehealth: Payer: Self-pay | Admitting: Gastroenterology

## 2019-08-01 NOTE — Telephone Encounter (Signed)
Pt is calling for [place] regarding her medication

## 2019-08-01 NOTE — Telephone Encounter (Signed)
Pt called stating her medication was shipped to our office. Pt will stop by Friday to pick up.

## 2019-08-22 ENCOUNTER — Telehealth: Payer: Self-pay | Admitting: Pharmacy Technician

## 2019-08-22 NOTE — Telephone Encounter (Signed)
Received 2021 proof of income.  Patient eligible to receive medication assistance at [facility] until time to re-certify in 3818, and as long as eligibility requirements continue to be met.  [place] [facility]

## 2019-08-23 ENCOUNTER — Telehealth: Payer: Self-pay | Admitting: Pharmacy Technician

## 2019-08-23 NOTE — Telephone Encounter (Signed)
Patient provided 2020 tax return.  [place] [facility]

## 2019-09-05 ENCOUNTER — Telehealth: Payer: Self-pay

## 2019-09-05 NOTE — Telephone Encounter (Signed)
Annette from [facility] called to let us know that patient states she did receive her Linzess from January 90 day supply.    Her next shipment will be sent to [place] around March 26th.  Please call Anne Ng in [facility] when the shipment arrives.  Annettes phone number is [phone].  Thanks,  [place], [place]

## 2019-10-28 ENCOUNTER — Encounter: Payer: Self-pay | Admitting: Emergency Medicine

## 2019-10-28 ENCOUNTER — Other Ambulatory Visit: Payer: Self-pay

## 2019-10-28 ENCOUNTER — Ambulatory Visit
Admission: EM | Admit: 2019-10-28 | Discharge: 2019-10-28 | Disposition: A | Discharge status: Home / Self Care | Payer: Self-pay | Attending: Emergency Medicine | Admitting: Emergency Medicine

## 2019-10-28 ENCOUNTER — Ambulatory Visit (INDEPENDENT_AMBULATORY_CARE_PROVIDER_SITE_OTHER): Payer: Self-pay

## 2019-10-28 DIAGNOSIS — T07XXXA Unspecified multiple injuries, initial encounter: Secondary | ICD-10-CM

## 2019-10-28 DIAGNOSIS — R0781 Pleurodynia: Secondary | ICD-10-CM

## 2019-10-28 MED ORDER — IBUPROFEN 600 MG PO TABS: 600.0000 mg | ORAL_TABLET | Freq: Four times a day (QID) | ORAL | 0 refills | Status: DC | PRN

## 2019-10-28 NOTE — Discharge Instructions (Addendum)
Take 600 mg of ibuprofen combined with the 1000 mg of Tylenol 3-4 times a day as needed for pain.  You had no fractures on any of your x-rays.  Here is a list of primary care providers who are taking new patients:  Dr. Otilio Miu [address] [address] [place] [address]  [phone]  [facility] [address] [place], [place] [address] [phone]  [facility] [place]  [phone] [place], [place] [address]  Here are clinics/ other resources who will see you if you do not have insurance. Some have certain criteria that you must meet. Call them and find out what they are:  [facility]: [address], [place], [place] [address] Phone: [phone] Hours: First and Third Saturdays of each Month, 9 a.m. - 1 p.m.  [facility]: [address] [place], [place], [place] [address] Phone: [phone] Hours: Tuesday, 4 p.m. - 8 p.m. Thursday, 1 p.m. - 8 p.m. Wednesday, 9 a.m. - [facility] [address] [place], [place] [address] Phone: [phone] Pharmacy Phone Number: [phone] Dental Phone Number: [phone] [facility]: [phone]  Dental Hours: Monday - Thursday, 8 a.m. - 6 p.m.  [facility] [address], [place], [place] [address] Phone: [phone] Pharmacy Phone Number: [phone] [facility] Insurance Help: [phone]  [facility] [address] [place], [place] [address] Phone: [phone] Pharmacy Phone Number: [phone] [facility] Insurance Help: [phone]  [facility] [address] [place], [place] [address] Phone: [phone] [facility] Insurance Help: [phone]   [address] [place], [place] [address] Phone: [phone]  Go to www.goodrx.com to look up your medications. This will give  you a list of where you can find your prescriptions at the most affordable prices. Or ask the pharmacist what the cash price is, or if they have any other discount programs available to help make your medication more affordable. This can be less expensive than what you would pay with insurance.

## 2019-10-28 NOTE — ED Triage Notes (Signed)
Pt states 11 days ago she fell going up the stairs at home from her dog. She has left fore arm pain right knee pain and left rib pain. She states it hurts to take a deep breath.

## 2019-10-28 NOTE — ED Provider Notes (Signed)
HPI  SUBJECTIVE:  Jill King is a 59 y.o. female who presents with multiple contusions sustained 11 days ago.  States that she was carrying her dog, tripped and fell up the stairs landing on her left forearm and right knee.  She reports immediate swelling on her left forearm, right knee.  She reports constant soreness in these areas.  Has tried ibuprofen 600 mg twice a day without improvement in symptoms.  Symptoms are worse with palpation.  No distal numbness or tingling in her arm or leg.  No grip weakness.  No injury to the elbow or wrist.  She has been ambulatory on her right lower extremity since the injury.  She also reports sharp, stabbing left rib pain in the mid axillary line that is present only with deep inspiration.  No coughing, wheezing, shortness of breath, hemoptysis, bruising.  She has been taking ibuprofen for this without improvement.  Symptoms are worse with breathing.  She has a past medical history of COPD, she is a former smoker.  No history of osteoporosis, diabetes, hypertension, anticoagulant/antiplatelet use.  PMD: None.   Past Medical History:  Diagnosis Date  . Arthritis    hands, wrist  . Asthma   . COPD (chronic obstructive pulmonary disease) ([place])   . Emphysema of lung ([place])   . GERD (gastroesophageal reflux disease)    Eosinophilic esophagitis 0000000  . Wears dentures    full upper and lower    Past Surgical History:  Procedure Laterality Date  . CESAREAN SECTION    . COLONOSCOPY WITH PROPOFOL N/A 01/08/2016   Procedure: COLONOSCOPY WITH PROPOFOL;  Surgeon: Lucilla Lame, MD;  Location: [place];  Service: Endoscopy;  Laterality: N/A;  . ESOPHAGOGASTRODUODENOSCOPY (EGD) WITH PROPOFOL N/A 01/08/2016   Procedure: ESOPHAGOGASTRODUODENOSCOPY (EGD) WITH PROPOFOL;  Surgeon: Lucilla Lame, MD;  Location: [place];  Service: Endoscopy;  Laterality: N/A;  . MULTIPLE TOOTH EXTRACTIONS    . POLYPECTOMY  01/08/2016   Procedure: POLYPECTOMY;  Surgeon:  Lucilla Lame, MD;  Location: [place];  Service: Endoscopy;;    Family History  Problem Relation Age of Onset  . COPD Mother   . Diabetes Father   . Heart disease Father     Social History   Tobacco Use  . Smoking status: Former Smoker    Packs/day: 1.00    Years: 30.00    Pack years: 30.00    Types: Cigarettes    Quit date: 12/26/2014    Years since quitting: 4.8  . Smokeless tobacco: Never Used  Substance Use Topics  . Alcohol use: Yes    Comment: 1 drink /2 weeks  . Drug use: No    No current facility-administered medications for this encounter.  Current Outpatient Medications:  .  albuterol (PROVENTIL HFA;VENTOLIN HFA) 108 (90 Base) MCG/ACT inhaler, Inhale 2 puffs into the lungs every 6 (six) hours as needed for wheezing or shortness of breath., Disp: 1 Inhaler, Rfl: 2 .  esomeprazole (NEXIUM) 40 MG capsule, Take 40 mg by mouth daily at 12 noon., Disp: , Rfl:  .  linaclotide (LINZESS) 145 MCG CAPS capsule, Take 1 capsule (145 mcg total) by mouth daily before breakfast., Disp: 30 capsule, Rfl: 11 .  ibuprofen (ADVIL) 600 MG tablet, Take 1 tablet (600 mg total) by mouth every 6 (six) hours as needed., Disp: 30 tablet, Rfl: 0  No Known Allergies   ROS  As noted in HPI.   Physical Exam  BP (!) 149/85 (BP Location: Right Arm)  Pulse 74   Temp 98.4 F (36.9 C)   Resp 18   Ht 5\' 3"  (1.6 m)   Wt 54 kg   SpO2 99%   BMI 21.09 kg/m    Constitutional: Well developed, well nourished, no acute distress Eyes:  EOMI, conjunctiva normal bilaterally HENT: Normocephalic, atraumatic,mucus membranes moist Respiratory: Normal inspiratory effort, CTAB. tenderness ribs 5-7 in the mid axillary line.  No bruising.  No crepitus.  No paradoxical chest wall movement Cardiovascular: Normal rate GI: nondistended skin: No rash, skin intact Musculoskeletal:  Left forearm.  3 x 3 tender area of swelling, ecchymosis proximal left ulna.  No tenderness over the radius.  Elbow,  wrist nontender.  No pain with elbow range of motion, supination, pronation.  RP 2+.  Motor and sensation grossly intact distally.    2 x 2 centimeter tender area of ecchymosis right lower extremity.  Positive tenderness over the proximal fibula.  No other tenderness over the knee, or distally.  Sensation distally grossly intact.  PT 2+.      Neurologic: Alert & oriented x 3, no focal neuro deficits Psychiatric: Speech and behavior appropriate   ED Course   Medications - No data to display  Orders Placed This Encounter  Procedures  . DG Ribs Unilateral W/Chest Left    Standing Status:   Standing    Number of Occurrences:   1    Order Specific Question:   Reason for Exam (SYMPTOM  OR DIAGNOSIS REQUIRED)    Answer:   fall tenderness risb 5-7 r/o fx ptx  . DG Forearm Left    Standing Status:   Standing    Number of Occurrences:   1    Order Specific Question:   Reason for Exam (SYMPTOM  OR DIAGNOSIS REQUIRED)    Answer:   fall tenderness risb 5-7 r/o fx ptx  . DG Tibia/Fibula Right    Standing Status:   Standing    Number of Occurrences:   1    Order Specific Question:   Reason for Exam (SYMPTOM  OR DIAGNOSIS REQUIRED)    Answer:   fall tenderness risb 5-7 r/o fx ptx    No results found for this or any previous visit (from the past 24 hour(s)). DG Ribs Unilateral W/Chest Left  Result Date: 10/28/2019 CLINICAL DATA:  Fall 11 days prior on stairs with left rib pain. EXAM: LEFT RIBS AND CHEST - 3+ VIEW COMPARISON:  04/17/2018 chest radiograph. FINDINGS: Stable cardiomediastinal silhouette with normal heart size. No pneumothorax. No pleural effusion. Hyperinflated lungs. No pulmonary edema. No acute consolidative airspace disease. Stable biapical pleural-parenchymal scarring, asymmetric to the left. The area of symptomatic concern as indicated by the patient in the lateral lower left chest wall was denoted with a metallic skin BB by the technologist. No evidence of acute left rib  fracture. Mild healed deformity in the posterolateral left tenth rib. No focal osseous lesions. IMPRESSION: 1. No evidence of acute left rib fracture. 2. Mild healed deformity in the posterolateral left tenth rib. 3. Hyperinflated lungs, suggesting COPD. No acute cardiopulmonary disease. Electronically Signed   By: Ilona Sorrel M.D.   On: 10/28/2019 10:38   DG Forearm Left  Result Date: 10/28/2019 CLINICAL DATA:  Fall levin days prior with left forearm pain and bruising EXAM: LEFT FOREARM - 2 VIEW COMPARISON:  None. FINDINGS: No fracture. No suspicious focal osseous lesions. Soft tissue swelling in proximal left forearm along the posterior ulna. No radiopaque foreign bodies. IMPRESSION: No fracture.  Soft tissue swelling in the proximal left forearm along the posterior ulna. Electronically Signed   By: Ilona Sorrel M.D.   On: 10/28/2019 10:40   DG Tibia/Fibula Right  Result Date: 10/28/2019 CLINICAL DATA:  Fall 11 days prior with right tibial pain EXAM: RIGHT TIBIA AND FIBULA - 2 VIEW COMPARISON:  None. FINDINGS: No fracture. No focal osseous lesions. Superficial small soft tissue calcification in posterolateral proximal right calf, probably dystrophic or small phlebolith. No radiopaque foreign body. IMPRESSION: No fracture. Electronically Signed   By: Ilona Sorrel M.D.   On: 10/28/2019 10:42    ED Clinical Impression  1. Multiple contusions   2. Rib pain      ED Assessment/Plan  Checking left forearm x-ray, right tib-fib x-ray, left rib series.  Reviewed imaging independently.  Left rib series:.  No pneumothorax, rib fracture. Left forearm: Positive hematoma with soft tissue swelling posterior ulna, no fracture. Right tib-fib.  No fracture. See radiology reports for full details.  Patient with multiple contusions.  No evidence of fracture.  home with heat for the contusion since it has been 11 days, 600 mg ibuprofen combined with 1000 mg of Tylenol.  Giving primary care list for ongoing  care.  Discussed  imaging, MDM, treatment plan, and plan for follow-up with patient. Discussed sn/sx that should prompt return to the ED. patient agrees with plan.   Meds ordered this encounter  Medications  . ibuprofen (ADVIL) 600 MG tablet    Sig: Take 1 tablet (600 mg total) by mouth every 6 (six) hours as needed.    Dispense:  30 tablet    Refill:  0    *This clinic note was created using [employer]. Therefore, there may be occasional mistakes despite careful proofreading.   ?    Melynda Ripple, MD 10/28/19 [phone]

## 2020-02-12 ENCOUNTER — Telehealth: Payer: Self-pay | Admitting: Pharmacist

## 2020-02-12 NOTE — Telephone Encounter (Signed)
02/12/2020 10:15:15 AM - Linzess 145 refill called to [name]  -- Jill King - Tuesday, February 12, 2020 10:13 AM --[name] spoke with [facility] for refill on Linzess 145- he placed refill order# [id number], allow 7-10 business day for [place] to receive at our office. Patient enrolled till 04/23/2020--patient next refill due 04/19/2020 he is not sure if patient will have to renew enrollment before next fill, call and they will advise.

## 2020-05-13 ENCOUNTER — Ambulatory Visit (INDEPENDENT_AMBULATORY_CARE_PROVIDER_SITE_OTHER): Payer: Self-pay

## 2020-05-13 ENCOUNTER — Ambulatory Visit
Admission: RE | Admit: 2020-05-13 | Discharge: 2020-05-13 | Disposition: A | Discharge status: Home / Self Care | Payer: Self-pay | Source: Ambulatory Visit | Attending: Emergency Medicine | Admitting: Emergency Medicine

## 2020-05-13 ENCOUNTER — Other Ambulatory Visit: Payer: Self-pay

## 2020-05-13 VITALS — BP 142/89 | HR 77 | Temp 98.4°F | Resp 18 | Ht 65.0 in | Wt 115.0 lb

## 2020-05-13 DIAGNOSIS — M5459 Other low back pain: Secondary | ICD-10-CM

## 2020-05-13 DIAGNOSIS — S32000A Wedge compression fracture of unspecified lumbar vertebra, initial encounter for closed fracture: Secondary | ICD-10-CM

## 2020-05-13 MED ORDER — TRAMADOL HCL 50 MG PO TABS
50.0000 mg | ORAL_TABLET | Freq: Four times a day (QID) | ORAL | 0 refills | Status: DC | PRN
Start: 2020-05-13 — End: 2021-03-14

## 2020-05-13 NOTE — ED Triage Notes (Signed)
Patient states she heard a loud pop in her back yesterday evening when she was doing something at home with her wood stove. She is c/o pain.

## 2020-05-13 NOTE — Discharge Instructions (Signed)
Use the tramadol every 6 hours as needed for pain.  You can also use over-the-counter Tylenol for the pain.  Please go to [facility] in [place] to the urgent care.

## 2020-05-13 NOTE — ED Provider Notes (Signed)
[facility]    CSN: [id number] Arrival date & time: 05/13/20  1152      History   Chief Complaint Chief Complaint  Patient presents with  . Appointment  . Back Pain    HPI Jill King is a 59 y.o. female.   59 year old female here for evaluation of back pain.  Patient reports that she was putting down a bucket of wood pellets and felt and heard a loud pop in her low back. She immediately had pain. The pain is worse when she sits and improves with movement. Patient denies numbness, tingling, or weakness in her lower extremities. No loss of bowel or bladder control. Pain does not radiate.     Past Medical History:  Diagnosis Date  . Arthritis    hands, wrist  . Asthma   . COPD (chronic obstructive pulmonary disease) ([place])   . Emphysema of lung ([place])   . GERD (gastroesophageal reflux disease)    Eosinophilic esophagitis 2992  . Wears dentures    full upper and lower    Patient Active Problem List   Diagnosis Date Noted  . Special screening for malignant neoplasms, colon   . Benign neoplasm of sigmoid colon   . Problems with swallowing and mastication   . COPD (chronic obstructive pulmonary disease) ([place]) 01/01/2016  . Asthma 01/01/2016  . GERD (gastroesophageal reflux disease) 01/01/2016    Past Surgical History:  Procedure Laterality Date  . CESAREAN SECTION    . COLONOSCOPY WITH PROPOFOL N/A 01/08/2016   Procedure: COLONOSCOPY WITH PROPOFOL;  Surgeon: Lucilla Lame, MD;  Location: [place];  Service: Endoscopy;  Laterality: N/A;  . ESOPHAGOGASTRODUODENOSCOPY (EGD) WITH PROPOFOL N/A 01/08/2016   Procedure: ESOPHAGOGASTRODUODENOSCOPY (EGD) WITH PROPOFOL;  Surgeon: Lucilla Lame, MD;  Location: [place];  Service: Endoscopy;  Laterality: N/A;  . MULTIPLE TOOTH EXTRACTIONS    . POLYPECTOMY  01/08/2016   Procedure: POLYPECTOMY;  Surgeon: Lucilla Lame, MD;  Location: [place];  Service: Endoscopy;;    OB History   No  obstetric history on file.      Home Medications    Prior to Admission medications   Medication Sig Start Date End Date Taking? Authorizing Provider  albuterol (PROVENTIL HFA;VENTOLIN HFA) 108 (90 Base) MCG/ACT inhaler Inhale 2 puffs into the lungs every 6 (six) hours as needed for wheezing or shortness of breath. 03/17/16  Yes Delman Kitten, MD  esomeprazole (NEXIUM) 40 MG capsule Take 40 mg by mouth daily at 12 noon.   Yes [provider]  linaclotide Rolan Lipa) 145 MCG CAPS capsule Take 1 capsule (145 mcg total) by mouth daily before breakfast. 07/24/19  Yes Lucilla Lame, MD  traMADol (ULTRAM) 50 MG tablet Take 1 tablet (50 mg total) by mouth every 6 (six) hours as needed. 05/13/20   Margarette Canada, NP    Family History Family History  Problem Relation Age of Onset  . COPD Mother   . Diabetes Father   . Heart disease Father     Social History Social History   Tobacco Use  . Smoking status: Former Smoker    Packs/day: 1.00    Years: 30.00    Pack years: 30.00    Types: Cigarettes    Quit date: 12/26/2014    Years since quitting: 5.3  . Smokeless tobacco: Never Used  Vaping Use  . Vaping Use: Never used  Substance Use Topics  . Alcohol use: Yes    Comment: 1 drink /2 weeks  . Drug  use: No     Allergies   Patient has no known allergies.   Review of Systems Review of Systems  Constitutional: Negative for activity change, appetite change and fatigue.  Respiratory: Negative for shortness of breath.   Cardiovascular: Negative for chest pain.  Musculoskeletal: Positive for back pain and myalgias. Negative for arthralgias.  Skin: Negative for rash.  Neurological: Negative for dizziness, weakness and numbness.  Hematological: Negative.   Psychiatric/Behavioral: Negative.      Physical Exam Triage Vital Signs ED Triage Vitals  Enc Vitals Group     BP 05/13/20 1225 (!) 142/89     Pulse Rate 05/13/20 1225 77     Resp 05/13/20 1225 18     Temp 05/13/20 1225  98.4 F (36.9 C)     Temp Source 05/13/20 1225 Oral     SpO2 05/13/20 1225 97 %     Weight 05/13/20 1224 115 lb (52.2 kg)     Height 05/13/20 1224 5\' 5"  (1.651 m)     Head Circumference --      Peak Flow --      Pain Score 05/13/20 1223 10     Pain Loc --      Pain Edu? --      Excl. in [place]? --    No data found.  Updated Vital Signs BP (!) 142/89 (BP Location: Right Arm)   Pulse 77   Temp 98.4 F (36.9 C) (Oral)   Resp 18   Ht 5\' 5"  (1.651 m)   Wt 115 lb (52.2 kg)   SpO2 97%   BMI 19.14 kg/m   Visual Acuity Right Eye Distance:   Left Eye Distance:   Bilateral Distance:    Right Eye Near:   Left Eye Near:    Bilateral Near:     Physical Exam Vitals and nursing note reviewed.  Constitutional:      General: She is not in acute distress.    Appearance: She is normal weight. She is not toxic-appearing.  HENT:     Head: Normocephalic and atraumatic.  Eyes:     General: No scleral icterus.    Extraocular Movements: Extraocular movements intact.     Conjunctiva/sclera: Conjunctivae normal.     Pupils: Pupils are equal, round, and reactive to light.  Cardiovascular:     Rate and Rhythm: Normal rate and regular rhythm.     Pulses: Normal pulses.     Heart sounds: Normal heart sounds. No murmur heard.  No gallop.   Pulmonary:     Effort: Pulmonary effort is normal.     Breath sounds: Normal breath sounds. No wheezing or rales.  Musculoskeletal:        General: Tenderness present. No swelling or deformity.     Comments: Patient has tenderness over the spinous process of L to L3. There is paraspinous tenderness in the same area. Patient has increased pain with lateral rotation and flexion. The area is exquisitely tender to palpation. No crepitus appreciated.  Skin:    Capillary Refill: Capillary refill takes less than 2 seconds.     Findings: No erythema or rash.  Neurological:     General: No focal deficit present.     Mental Status: She is alert and oriented to  person, place, and time.  Psychiatric:        Mood and Affect: Mood normal.        Behavior: Behavior normal.        Thought Content: Thought content  normal.        Judgment: Judgment normal.      UC Treatments / Results  Labs (all labs ordered are listed, but only abnormal results are displayed) Labs Reviewed - No data to display  EKG   Radiology DG Lumbar Spine Complete  Result Date: 05/13/2020 CLINICAL DATA:  L2-L3 bony tenderness. Patient heard a loud pop in her back yesterday when she was lifting a bucket of pellets. EXAM: LUMBAR SPINE - COMPLETE 4+ VIEW COMPARISON:  CT 01/17/2019 FINDINGS: Mild convex LEFT lumbar scoliosis is stable. There is a superior endplate fracture of L4, likely acute with approximately 10% loss of anterior height. Chronic compression fracture at L5 appears stable. No lytic or blastic lesions. IMPRESSION: Suspect acute superior endplate fracture of L4. Chronic compression of L5, stable. Recommend assessment of bone mineral density. Electronically Signed   By: Nolon Nations M.D.   On: 05/13/2020 13:21    Procedures Procedures (including critical care time)  Medications Ordered in UC Medications - No data to display  Initial Impression / Assessment and Plan / UC Course  I have reviewed the triage vital signs and the nursing notes.  Pertinent labs & imaging results that were available during my care of the patient were reviewed by me and considered in my medical decision making (see chart for details).   Patient is here for evaluation of low back pain after pulling down a heavy bucket of wood pellets yesterday. She reports she heard a pop. She has tenderness in the area of L2, L3. She has both bony tenderness and paraspinous tenderness and spasm. Patient is a neck smoker. Given the exquisite nature of the tenderness to her low back will obtain x-rays.  Radiology read of lumbar spine films is a an acute superior endplate fracture of L4  vertebrae.  Will DC patient home with tramadol for pain.  Will recommend patient follow-up with her PCP for a bone mineral density scan.   Final Clinical Impressions(s) / UC Diagnoses   Final diagnoses:  Lumbar compression fracture, closed, initial encounter [facility])     Discharge Instructions     Use the tramadol every 6 hours as needed for pain.  You can also use over-the-counter Tylenol for the pain.  Please go to [facility]    ED Prescriptions    Medication Sig Dispense Auth. Provider   traMADol (ULTRAM) 50 MG tablet Take 1 tablet (50 mg total) by mouth every 6 (six) hours as needed. 15 tablet Margarette Canada, NP     I have reviewed the PDMP during this encounter.   Margarette Canada, NP 05/13/20 1437

## 2020-07-21 IMAGING — CR DG CHEST 2V
2 series · 2 of 2 positions shown · non-contrast
Comparison: May 23, 2015

CLINICAL DATA: Shortness of breath and cough

EXAM:
CHEST - 2 VIEW

[chest pa]
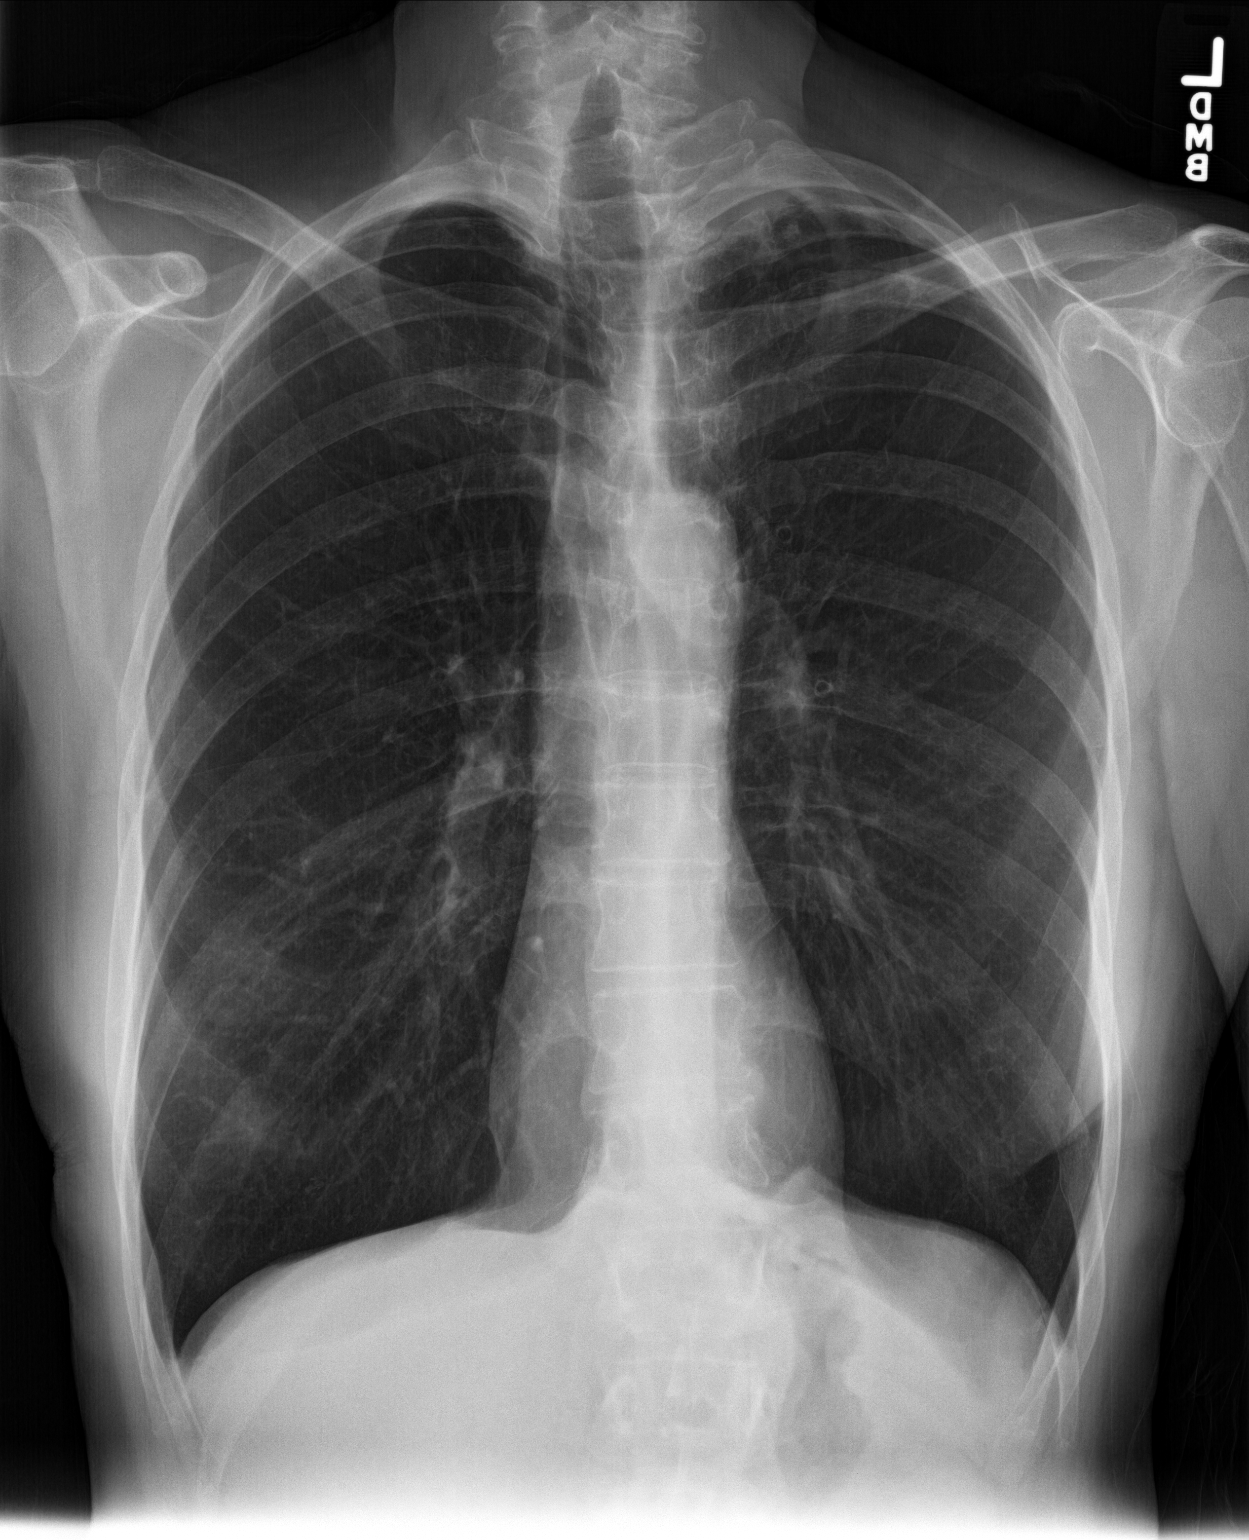

[chest lat]
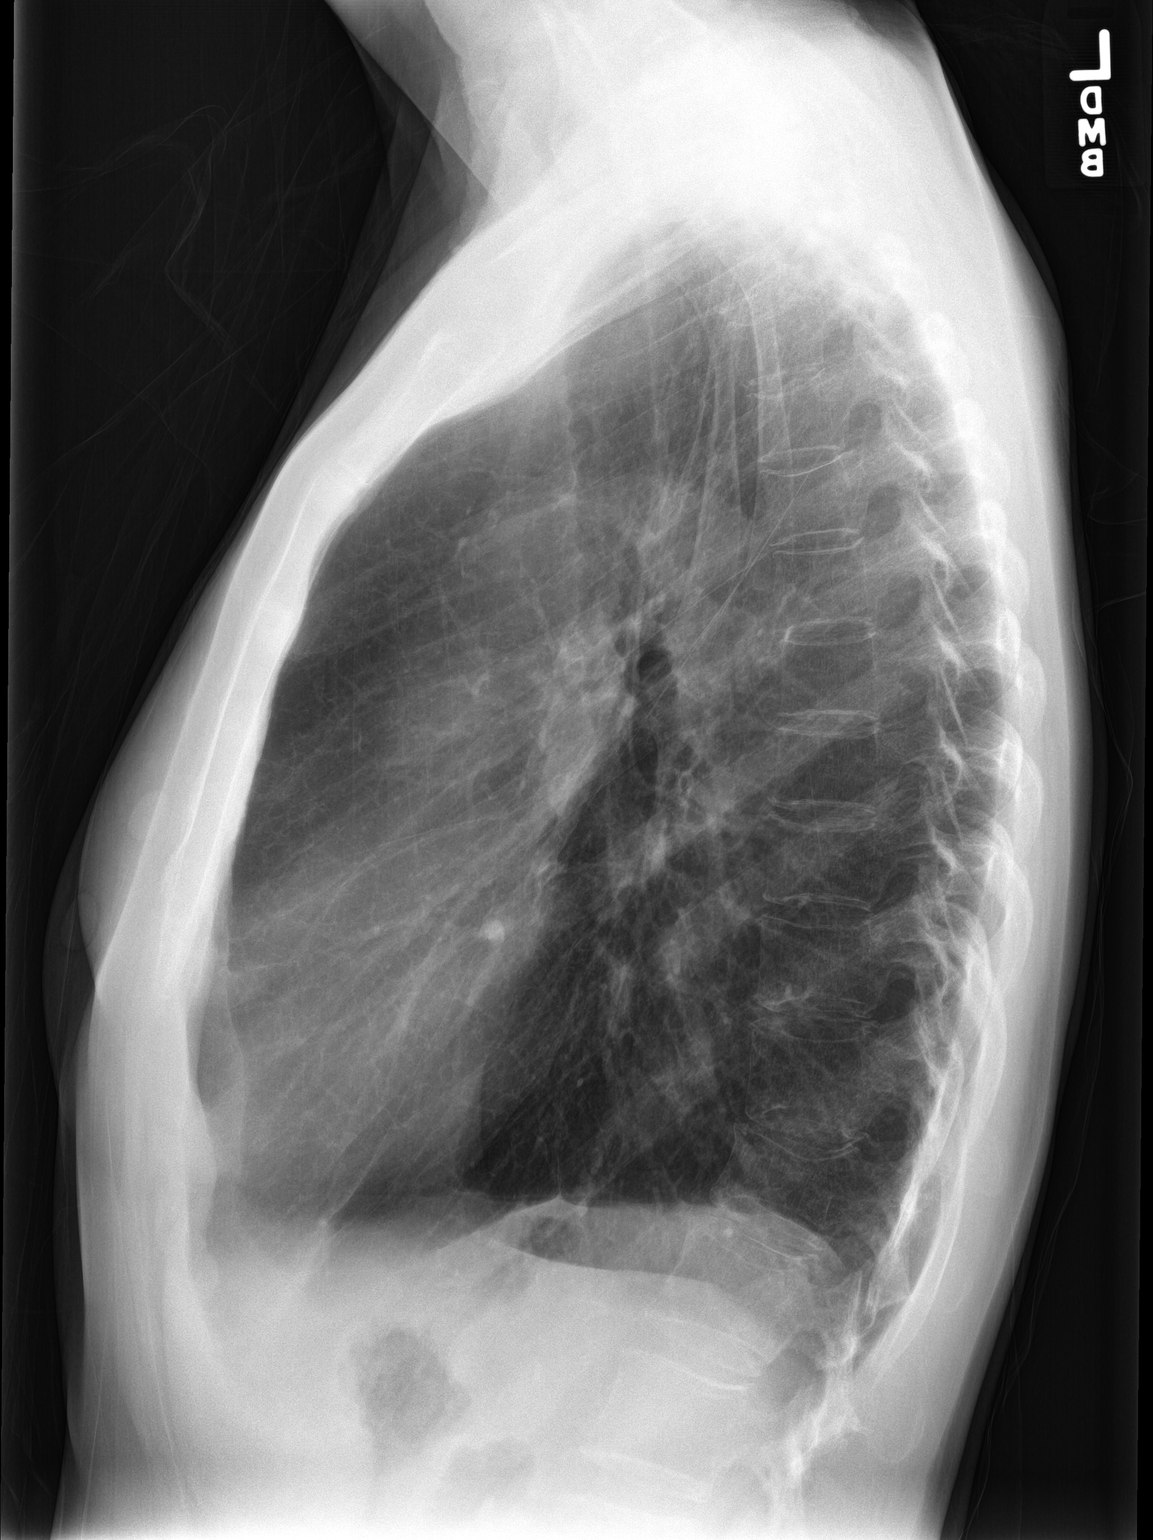

[2 of 2 positions shown; findings below may reference images not displayed]

FINDINGS: Lungs are again noted to be hyperexpanded. There is scarring in the
left apex, stable. There is no edema or consolidation. The heart
size and pulmonary vascularity within normal limits. No evident
adenopathy. No bone lesions. There is an apparent nipple shadow on
the right.
IMPRESSION: Lungs hyperexpanded. Stable scarring left apex. No edema or
consolidation. Stable cardiac silhouette. No evident adenopathy.

## 2020-08-06 ENCOUNTER — Telehealth: Payer: Self-pay | Admitting: Pharmacist

## 2020-08-06 NOTE — Telephone Encounter (Signed)
08/06/2020 10:33:51 AM - Linzess renewall to [name]  -- Elmer Picker - Wednesday, August 06, 2020 10:32 AM --Ewell Poe renewal for Linzess 145mg  Take 1 capsule by mouth everyday before breakfast.

## 2020-08-22 ENCOUNTER — Other Ambulatory Visit: Payer: Self-pay | Admitting: Gastroenterology

## 2020-08-25 ENCOUNTER — Telehealth: Payer: Self-pay | Admitting: Gastroenterology

## 2020-08-25 NOTE — Telephone Encounter (Signed)
Patient called and asked that you call her back please

## 2020-08-26 NOTE — Telephone Encounter (Signed)
Spoke with pt regarding medication refill. Pt scheduled for a follow up. Refill for 30 days has been sent to medication management.

## 2020-08-28 ENCOUNTER — Telehealth: Payer: Self-pay | Admitting: Gastroenterology

## 2020-08-28 NOTE — Telephone Encounter (Signed)
Patient called asked for call back regarding her Linzess

## 2020-08-29 NOTE — Telephone Encounter (Signed)
Returned pt's call regarding Linzess medication. Pt requested samples until Medication management could get her patient assistance approved.

## 2020-09-11 ENCOUNTER — Encounter: Payer: Self-pay | Admitting: Gastroenterology

## 2020-09-11 ENCOUNTER — Other Ambulatory Visit: Payer: Self-pay

## 2020-09-11 ENCOUNTER — Ambulatory Visit (INDEPENDENT_AMBULATORY_CARE_PROVIDER_SITE_OTHER): Payer: Self-pay | Admitting: Gastroenterology

## 2020-09-11 VITALS — BP 168/76 | HR 99 | Ht 65.0 in | Wt 116.0 lb

## 2020-09-11 DIAGNOSIS — K5904 Chronic idiopathic constipation: Secondary | ICD-10-CM

## 2020-09-11 NOTE — Progress Notes (Signed)
Primary Care Physician: Patient, No Pcp Per  Primary Gastroenterologist:  Dr. Lucilla Lame  Chief Complaint  Patient presents with  . Medication Refill    Linzess 120mcg    HPI: Jill King is a 60 y.o. female here for follow-up and a medication refill.  The patient has been doing well with Linzess and states that her constipation is not an issue as long she takes Linzess.  The patient's last colonoscopy was in 2017 and it was normal and she is not due for another colonoscopy until 2027. She denies any fevers chills nausea vomiting black stools or bloody stools.   Past Medical History:  Diagnosis Date  . Arthritis    hands, wrist  . Asthma   . COPD (chronic obstructive pulmonary disease) ([place])   . Emphysema of lung ([place])   . GERD (gastroesophageal reflux disease)    Eosinophilic esophagitis 6578  . Wears dentures    full upper and lower    Current Outpatient Medications  Medication Sig Dispense Refill  . albuterol (PROVENTIL HFA;VENTOLIN HFA) 108 (90 Base) MCG/ACT inhaler Inhale 2 puffs into the lungs every 6 (six) hours as needed for wheezing or shortness of breath. 1 Inhaler 2  . esomeprazole (NEXIUM) 40 MG capsule Take 40 mg by mouth daily at 12 noon.    Marland Kitchen LINZESS 145 MCG CAPS capsule TAKE ONE CAPSULE BY MOUTH EVERY DAY BEFORE BREAKFAST 30 capsule 0  . traMADol (ULTRAM) 50 MG tablet Take 1 tablet (50 mg total) by mouth every 6 (six) hours as needed. 15 tablet 0   No current facility-administered medications for this visit.    Allergies as of 09/11/2020  . (No Known Allergies)    ROS:  General: Negative for anorexia, weight loss, fever, chills, fatigue, weakness. ENT: Negative for hoarseness, difficulty swallowing , nasal congestion. CV: Negative for chest pain, angina, palpitations, dyspnea on exertion, peripheral edema.  Respiratory: Negative for dyspnea at rest, dyspnea on exertion, cough, sputum, wheezing.  GI: See history of present illness. GU:  Negative for  dysuria, hematuria, urinary incontinence, urinary frequency, nocturnal urination.  Endo: Negative for unusual weight change.    Physical Examination:   BP (!) 168/76   Pulse 99   Ht 5\' 5"  (1.651 m)   Wt 116 lb (52.6 kg)   BMI 19.30 kg/m   General: Well-nourished, well-developed in no acute distress.  Eyes: No icterus. Conjunctivae pink. Lungs: Clear to auscultation bilaterally. Non-labored. Heart: Regular rate and rhythm, no murmurs rubs or gallops.  Abdomen: Bowel sounds are normal, nontender, nondistended, no hepatosplenomegaly or masses, no abdominal bruits or hernia , no rebound or guarding.   Extremities: No lower extremity edema. No clubbing or deformities. Neuro: Alert and oriented x 3.  Grossly intact. Skin: Warm and dry, no jaundice.   Psych: Alert and cooperative, normal mood and affect.  Labs:    Imaging Studies: No results found.  Assessment and Plan:   [name] is a 60 y.o. y/o female who comes in today with a history of constipation doing well on Linzess.  The patient will continue the [place] and has been given refills of the prescriptions.  The patient is not due for repeat colonoscopy until 2027.  She has no change in bowel habits or worrisome diseases time.  The patient has been explained the plan and agrees with it.     Lucilla Lame, MD. [name]    Note: This dictation was prepared with Dragon dictation along with smaller phrase technology.  Any transcriptional errors that result from this process are unintentional.

## 2020-09-15 ENCOUNTER — Telehealth: Payer: Self-pay | Admitting: Pharmacy Technician

## 2020-09-15 NOTE — Telephone Encounter (Signed)
Patient failed to provide 2022 proof of income.  No additional medication assistance will be provided by [facility] without the required proof of income documentation.  Patient notified by letter.  [name], [place]  [id number]    September 15, 2020    [name] [address] [place], [place]  [address]  Dear Jill King:  This is to inform you that you are no longer eligible to receive medication assistance at [facility].  The reason(s) are:    _____Your total gross monthly household income exceeds 250% of the Federal Poverty Level.   _____Tangible assets (savings, checking, stocks/bonds, pension, retirement, etc.) exceeds our limit  _____You are eligible to receive benefits from [facility], [facility] or HIV Medication              Assistance Program _____You are eligible to receive benefits from a Medicare Part "D" plan _____You have prescription insurance  _____You are not an [facility] resident __X__Failure to provide all requested proof of income information for 2022.    Medication assistance will resume once all requested financial information has been returned to our clinic.  If you have questions, please contact our clinic at [phone].    Thank you,  [facility]

## 2020-10-10 ENCOUNTER — Other Ambulatory Visit: Payer: Self-pay

## 2020-10-10 ENCOUNTER — Other Ambulatory Visit: Payer: Self-pay | Admitting: Gastroenterology

## 2020-10-10 MED ORDER — LINZESS 145 MCG PO CAPS
ORAL_CAPSULE | ORAL | 1 refills | Status: DC
  Filled 2020-11-13: qty 30, 30d supply, fill #0
  Filled 2020-12-11 (×2): qty 30, 30d supply, fill #1

## 2020-10-13 ENCOUNTER — Other Ambulatory Visit: Payer: Self-pay

## 2020-10-28 ENCOUNTER — Telehealth: Payer: Self-pay | Admitting: Pharmacist

## 2020-10-28 NOTE — Telephone Encounter (Signed)
PATIENT [facility] APPROVED TILL 08/26/2021.Jill King

## 2020-11-13 ENCOUNTER — Other Ambulatory Visit: Payer: Self-pay

## 2020-11-14 ENCOUNTER — Other Ambulatory Visit: Payer: Self-pay

## 2020-12-11 ENCOUNTER — Other Ambulatory Visit: Payer: Self-pay

## 2021-01-08 ENCOUNTER — Other Ambulatory Visit: Payer: Self-pay | Admitting: Gastroenterology

## 2021-01-09 ENCOUNTER — Other Ambulatory Visit: Payer: Self-pay

## 2021-01-12 ENCOUNTER — Other Ambulatory Visit: Payer: Self-pay

## 2021-01-13 ENCOUNTER — Other Ambulatory Visit: Payer: Self-pay

## 2021-01-13 ENCOUNTER — Other Ambulatory Visit: Payer: Self-pay | Admitting: Gastroenterology

## 2021-01-14 ENCOUNTER — Other Ambulatory Visit: Payer: Self-pay

## 2021-01-15 ENCOUNTER — Other Ambulatory Visit: Payer: Self-pay

## 2021-01-15 ENCOUNTER — Telehealth: Payer: Self-pay | Admitting: Gastroenterology

## 2021-01-15 MED FILL — Linaclotide Cap 145 MCG: ORAL | Qty: 30 | Fill #0 | Status: CN

## 2021-01-15 NOTE — Telephone Encounter (Signed)
Prescription has been sent to medication management.

## 2021-01-15 NOTE — Telephone Encounter (Signed)
Patient stated that she checked with the pharamcy and her linaclotide (LINZESS) 145 MCG CAPS capsule hasn't been refilled. Clinical staff will follow up with patient.

## 2021-01-16 ENCOUNTER — Other Ambulatory Visit: Payer: Self-pay

## 2021-01-17 MED FILL — Linaclotide Cap 145 MCG: ORAL | 30 days supply | Qty: 30 | Fill #0 | Status: CN

## 2021-01-19 ENCOUNTER — Other Ambulatory Visit: Payer: Self-pay

## 2021-01-26 ENCOUNTER — Telehealth: Payer: Self-pay | Admitting: Pharmacist

## 2021-01-26 NOTE — Telephone Encounter (Signed)
01/26/2021 4:11:58 PM - Call to [name] on [place] - Monday, January 26, 2021 4:09 PM --[name] spoke with [facility] shipped Linzess to patient's home on 09/03/2020--I requested a refill today and to ship to our office as on application & script--he stated he would need a new script-I asked for pharmacy or supervisor-placed on hold for 20 min and then cut off. Called back spoke with Merrily Pew - he was able to change the shipping address and placed refill--order# [id number] allow 7-10 business days for [place] to receive at our office.  This med needs to be processed through our pharmacy and then dispensed to patient from our office.

## 2021-01-29 ENCOUNTER — Other Ambulatory Visit: Payer: Self-pay

## 2021-01-29 MED FILL — Linaclotide Cap 145 MCG: ORAL | 30 days supply | Qty: 30 | Fill #0 | Status: CN

## 2021-01-30 ENCOUNTER — Other Ambulatory Visit: Payer: Self-pay

## 2021-02-09 ENCOUNTER — Other Ambulatory Visit: Payer: Self-pay

## 2021-02-09 MED FILL — Linaclotide Cap 145 MCG: ORAL | 30 days supply | Qty: 30 | Fill #0 | Status: CN

## 2021-02-12 ENCOUNTER — Telehealth: Payer: Self-pay | Admitting: Gastroenterology

## 2021-02-12 NOTE — Telephone Encounter (Signed)
Spoke with pt and advised her I have been in touch with Medication Management regarding her medication. Also, advised pt that we are currently out of samples of the Linzess. We are expecting samples next week.

## 2021-02-12 NOTE — Telephone Encounter (Signed)
linaclotide (LINZESS) 145 MCG CAPS capsule   Patient is needing samples as she is having problems getting Medical Management to respond. Please call patient.

## 2021-02-16 ENCOUNTER — Other Ambulatory Visit: Payer: Self-pay

## 2021-02-18 ENCOUNTER — Other Ambulatory Visit: Payer: Self-pay

## 2021-02-18 MED FILL — Linaclotide Cap 145 MCG: ORAL | 90 days supply | Qty: 90 | Fill #0 | Status: AC

## 2021-03-14 ENCOUNTER — Ambulatory Visit (INDEPENDENT_AMBULATORY_CARE_PROVIDER_SITE_OTHER): Payer: Self-pay

## 2021-03-14 ENCOUNTER — Ambulatory Visit
Admission: EM | Admit: 2021-03-14 | Discharge: 2021-03-14 | Disposition: A | Discharge status: Home / Self Care | Payer: Self-pay | Attending: Medical Oncology | Admitting: Medical Oncology

## 2021-03-14 ENCOUNTER — Encounter: Payer: Self-pay | Admitting: Emergency Medicine

## 2021-03-14 ENCOUNTER — Other Ambulatory Visit: Payer: Self-pay

## 2021-03-14 DIAGNOSIS — M546 Pain in thoracic spine: Secondary | ICD-10-CM

## 2021-03-14 DIAGNOSIS — M545 Low back pain, unspecified: Secondary | ICD-10-CM

## 2021-03-14 MED ORDER — TRAMADOL HCL 50 MG PO TABS
50.0000 mg | ORAL_TABLET | Freq: Four times a day (QID) | ORAL | 0 refills | Status: DC | PRN
Start: 2021-03-14 — End: 2021-11-29

## 2021-03-14 NOTE — ED Provider Notes (Signed)
[facility]    CSN: [id number] Arrival date & time: 03/14/21  0940      History   Chief Complaint Chief Complaint  Patient presents with   Back Pain    HPI Jill King is a 60 y.o. female.   HPI  Back Pain: Patient reports that she has had back pain for the past 2 weeks.  No known injury or event to cause the pain.  Pain is mid to upper back in nature.  She reports that over the past 2 days symptoms have significantly worsened.  She has tried Manufacturing systems engineer back pain relief medication with mild improvement.  No loss of sensation of groin, incontinence or weakness of legs.  Of note she does have a history of a compression fracture of her lumbar spine that occurred after lifting a 5 pound bucket of wood pellets.   Past Medical History:  Diagnosis Date   Arthritis    hands, wrist   Asthma    COPD (chronic obstructive pulmonary disease) ([facility])    Emphysema of lung ([facility])    GERD (gastroesophageal reflux disease)    Eosinophilic esophagitis 0000000   Wears dentures    full upper and lower    Patient Active Problem List   Diagnosis Date Noted   Special screening for malignant neoplasms, colon    Benign neoplasm of sigmoid colon    Problems with swallowing and mastication    COPD (chronic obstructive pulmonary disease) ([place]) 01/01/2016   Asthma 01/01/2016   GERD (gastroesophageal reflux disease) 01/01/2016    Past Surgical History:  Procedure Laterality Date   CESAREAN SECTION     COLONOSCOPY WITH PROPOFOL N/A 01/08/2016   Procedure: COLONOSCOPY WITH PROPOFOL;  Surgeon: Lucilla Lame, MD;  Location: [place];  Service: Endoscopy;  Laterality: N/A;   ESOPHAGOGASTRODUODENOSCOPY (EGD) WITH PROPOFOL N/A 01/08/2016   Procedure: ESOPHAGOGASTRODUODENOSCOPY (EGD) WITH PROPOFOL;  Surgeon: Lucilla Lame, MD;  Location: [place];  Service: Endoscopy;  Laterality: N/A;   MULTIPLE TOOTH EXTRACTIONS     POLYPECTOMY  01/08/2016   Procedure: POLYPECTOMY;  Surgeon: Lucilla Lame, MD;  Location: [place];  Service: Endoscopy;;    OB History   No obstetric history on file.      Home Medications    Prior to Admission medications   Medication Sig Start Date End Date Taking? Authorizing Provider  esomeprazole (NEXIUM) 40 MG capsule Take 40 mg by mouth daily at 12 noon.   Yes [provider]  linaclotide (LINZESS) 145 MCG CAPS capsule TAKE ONE CAPSULE BY MOUTH EVERY DAY BEFORE BREAKFAST 01/15/21  Yes Lucilla Lame, MD  albuterol (PROVENTIL HFA;VENTOLIN HFA) 108 (90 Base) MCG/ACT inhaler Inhale 2 puffs into the lungs every 6 (six) hours as needed for wheezing or shortness of breath. 03/17/16   Delman Kitten, MD  traMADol (ULTRAM) 50 MG tablet Take 1 tablet (50 mg total) by mouth every 6 (six) hours as needed. 05/13/20   Margarette Canada, NP    Family History Family History  Problem Relation Age of Onset   COPD Mother    Diabetes Father    Heart disease Father     Social History Social History   Tobacco Use   Smoking status: Former    Packs/day: 1.00    Years: 30.00    Pack years: 30.00    Types: Cigarettes    Quit date: 12/26/2014    Years since quitting: 6.2   Smokeless tobacco: Never  Vaping Use   Vaping  Use: Never used  Substance Use Topics   Alcohol use: Yes    Comment: 1 drink /2 weeks   Drug use: No     Allergies   Patient has no known allergies.   Review of Systems Review of Systems  As stated above in HPI Physical Exam Triage Vital Signs ED Triage Vitals  Enc Vitals Group     BP 03/14/21 1009 (!) 153/90     Pulse Rate 03/14/21 1009 79     Resp 03/14/21 1009 14     Temp 03/14/21 1009 98.2 F (36.8 C)     Temp Source 03/14/21 1009 Oral     SpO2 03/14/21 1009 100 %     Weight 03/14/21 1007 116 lb (52.6 kg)     Height 03/14/21 1007 '5\' 5"'$  (1.651 m)     Head Circumference --      Peak Flow --      Pain Score 03/14/21 1007 7     Pain Loc --      Pain Edu? --      Excl. in [place]? --    No data found.  Updated  Vital Signs BP (!) 153/90 (BP Location: Right Arm)   Pulse 79   Temp 98.2 F (36.8 C) (Oral)   Resp 14   Ht '5\' 5"'$  (1.651 m)   Wt 116 lb (52.6 kg)   SpO2 100%   BMI 19.30 kg/m   Physical Exam Vitals and nursing note reviewed.  Musculoskeletal:     Cervical back: Normal.     Thoracic back: Spasms, tenderness and bony tenderness present.     Lumbar back: Normal. No spasms or tenderness. Normal range of motion.       Back:  Neurological:     General: No focal deficit present.     Motor: No weakness.     Coordination: Coordination normal.     Gait: Gait normal.     Deep Tendon Reflexes: Reflexes normal.     UC Treatments / Results  Labs (all labs ordered are listed, but only abnormal results are displayed) Labs Reviewed - No data to display  EKG   Radiology No results found.  Procedures Procedures (including critical care time)  Medications Ordered in UC Medications - No data to display  Initial Impression / Assessment and Plan / UC Course  I have reviewed the triage vital signs and the nursing notes.  Pertinent labs & imaging results that were available during my care of the patient were reviewed by me and considered in my medical decision making (see chart for details).     New. X rays pending given her point tenderness and history of compression fracture.   UPDATE: X rays show 4 new sites concerning for fracture. She will need orthopedic follow up. She has brace from past fracture that she will wear. Tramadol discussed and prescribed. Discussed red flag signs and symptoms. Calcium and vitamin D rich diet encouraged.  Final Clinical Impressions(s) / UC Diagnoses   Final diagnoses:  None   Discharge Instructions   None    ED Prescriptions   None    PDMP not reviewed this encounter.   Hughie Closs, [place] 03/14/21 1114

## 2021-03-14 NOTE — ED Triage Notes (Signed)
Patient c/o mid back pain that started 2 weeks ago.  Patient states that her pain has progressively gotten worse. Patient states that she did fracture her spine in the lower part of her back, back in November 2021.

## 2021-03-26 ENCOUNTER — Other Ambulatory Visit: Payer: Self-pay

## 2021-03-26 MED ORDER — GABAPENTIN 100 MG PO CAPS
100.0000 mg | ORAL_CAPSULE | Freq: Three times a day (TID) | ORAL | 0 refills | Status: DC | PRN
  Filled 2021-03-26: qty 90, 30d supply, fill #0

## 2021-03-26 MED ORDER — NAPROXEN 500 MG PO TABS
500.0000 mg | ORAL_TABLET | Freq: Two times a day (BID) | ORAL | 1 refills | Status: DC | PRN
Start: 2021-03-26 — End: 2021-11-29
  Filled 2021-03-26: qty 60, 30d supply, fill #0

## 2021-03-26 MED ORDER — ALBUTEROL SULFATE HFA 108 (90 BASE) MCG/ACT IN AERS
2.0000 | INHALATION_SPRAY | Freq: Every day | RESPIRATORY_TRACT | 3 refills | Status: DC
  Filled 2021-03-26: qty 6.7, 17d supply, fill #0
  Filled 2021-04-23: qty 6.7, 17d supply, fill #1
  Filled 2021-07-06 – 2021-09-23 (×2): qty 6.7, 17d supply, fill #2

## 2021-03-27 ENCOUNTER — Other Ambulatory Visit: Payer: Self-pay

## 2021-04-15 ENCOUNTER — Other Ambulatory Visit: Payer: Self-pay

## 2021-04-23 ENCOUNTER — Other Ambulatory Visit: Payer: Self-pay

## 2021-04-24 ENCOUNTER — Other Ambulatory Visit: Payer: Self-pay

## 2021-04-27 ENCOUNTER — Ambulatory Visit: Payer: Self-pay

## 2021-04-27 ENCOUNTER — Other Ambulatory Visit: Payer: Self-pay

## 2021-04-27 VITALS — Ht 65.0 in | Wt 118.0 lb

## 2021-04-27 DIAGNOSIS — Z79899 Other long term (current) drug therapy: Secondary | ICD-10-CM

## 2021-04-27 NOTE — Progress Notes (Signed)
[facility] Visit Note  Patient: Jill King MRN: [id number] Date of Birth: 11/05/1960 PCP: Patient, No Pcp Per (Inactive)   Jill King 60 y.o. female presents for a annual MTM visit today.  There were no vitals taken for this visit.  Patient Information   Past Medical History:  Diagnosis Date   Arthritis    hands, wrist   Asthma    COPD (chronic obstructive pulmonary disease) (HCC)    Emphysema of lung (HCC)    GERD (gastroesophageal reflux disease)    Eosinophilic esophagitis 7001   Osteoporosis    Wears dentures    full upper and lower      Past Surgical History:  Procedure Laterality Date   CESAREAN SECTION     COLONOSCOPY WITH PROPOFOL N/A 01/08/2016   Procedure: COLONOSCOPY WITH PROPOFOL;  Surgeon: Lucilla Lame, MD;  Location: [facility];  Service: Endoscopy;  Laterality: N/A;   ESOPHAGOGASTRODUODENOSCOPY (EGD) WITH PROPOFOL N/A 01/08/2016   Procedure: ESOPHAGOGASTRODUODENOSCOPY (EGD) WITH PROPOFOL;  Surgeon: Lucilla Lame, MD;  Location: [facility];  Service: Endoscopy;  Laterality: N/A;   MULTIPLE TOOTH EXTRACTIONS     POLYPECTOMY  01/08/2016   Procedure: POLYPECTOMY;  Surgeon: Lucilla Lame, MD;  Location: [facility];  Service: Endoscopy;;     Family History  Problem Relation Age of Onset   COPD Mother    Diabetes Father    Heart disease Father    CAD Brother     New Diagnoses (since last visit):   Family Support: Good  Lifestyle Diet: Breakfast: sausage egg and cheese biscuit  Lunch:chicken, berries  Dinner:usually skips due to GERD Drinks:water and KoolAid (homemade)     Current Exercise Habits: The patient does not participate in regular exercise at present  Exercise limited by: None identified    Social History   Substance and Sexual Activity  Alcohol Use Yes   Comment: 1 drink /2 weeks      Social History   Tobacco Use  Smoking Status Former   Packs/day: 1.00   Years: 30.00   Pack years:  30.00   Types: Cigarettes   Quit date: 12/26/2014   Years since quitting: 6.3  Smokeless Tobacco Never      Health Maintenance  Topic Date Due   COVID-19 Vaccine (1) Never done   Pneumococcal Vaccine 60-66 Years old (1 - PCV) Never done   HIV Screening  Never done   Hepatitis C Screening  Never done   TETANUS/TDAP  Never done   Zoster Vaccines- Shingrix (1 of 2) Never done   PAP SMEAR-Modifier  Never done   MAMMOGRAM  Never done   INFLUENZA VACCINE  01/26/2021   COLONOSCOPY (Pts 45-75yrs Insurance coverage will need to be confirmed)  01/07/2026   HPV VACCINES  Aged Out   Health Maintenance/Date Completed  Last ED visit: 03/14/2021 Last Visit to PCP: 03/2021 Next Visit to PCP: TBD Specialist Visit: 08/2020 Dental Exam: none recent Eye Exam: none recent  Pelvic/PAP Exam: none recent  Mammogram: none recent  DEXA: 02/2021 Colonoscopy: 01/15/2016 Flu Vaccine: 2021 Pneumonia Vaccine: no COVID-19 Vaccine: received first 2, no boosters  Shingrix Vaccine: no   Outpatient Encounter Medications as of 04/27/2021  Medication Sig   albuterol (VENTOLIN HFA) 108 (90 Base) MCG/ACT inhaler Inhale 2 puffs into the lungs once every 4-6 hours as needed for coughing, wheezing, or shortness of breath.   esomeprazole (NEXIUM) 40 MG capsule Take 40 mg by mouth daily at 12 noon.   gabapentin (  NEURONTIN) 100 MG capsule Take 1 capsule (100 mg total) by mouth 3 (three) times daily as needed for pain.   linaclotide (LINZESS) 145 MCG CAPS capsule TAKE ONE CAPSULE BY MOUTH EVERY DAY BEFORE BREAKFAST   naproxen (NAPROSYN) 500 MG tablet Take 1 tablet (500 mg total) by mouth 2 (two) times daily as needed for pain.   traMADol (ULTRAM) 50 MG tablet Take 1 tablet (50 mg total) by mouth every 6 (six) hours as needed. (Patient not taking: Reported on 04/27/2021)   [DISCONTINUED] albuterol (PROVENTIL HFA;VENTOLIN HFA) 108 (90 Base) MCG/ACT inhaler Inhale 2 puffs into the lungs every 6 (six) hours as needed for  wheezing or shortness of breath.   No facility-administered encounter medications on file as of 04/27/2021.     Assessment and Plan: Medication adherence Pt has minimal scheduled medications, but reports good adherence to medications and rarely misses doses.  Asthma/COPD Currently on PRN albuterol, using up to three times daily. Discussed benefits of adding maintenance medications such as Advair or Spirva. Pt to discuss w/ PCP at next visit  GERD Currently in esomeprazole, no issues reported. Pt reports not eating dinner/late at night to help prevent reflux symptoms  Pain Currently on naproxen and gabapentin. Reported reducing dose of gabapentin due to adverse reaction, and states naproxen works better for her pain Constipation  Currently on Linzess, reports no problems  Health maintenance  Will mail brochure for mammogram and pap smear    RTC 1 year   Narda Rutherford, PharmD Pharmacy Resident  04/27/2021 3:08 PM

## 2021-04-30 ENCOUNTER — Other Ambulatory Visit: Payer: Self-pay

## 2021-05-01 ENCOUNTER — Telehealth: Payer: Self-pay | Admitting: Pharmacist

## 2021-05-01 NOTE — Telephone Encounter (Signed)
--   Elmer Picker - Friday, May 01, 2021 11:13 AM --[name] spoke with [id number] for refill on Linzess 145--allow 7-10 business days for [place] to receive.

## 2021-05-04 ENCOUNTER — Other Ambulatory Visit: Payer: Self-pay

## 2021-05-04 MED ORDER — ALENDRONATE SODIUM 70 MG PO TABS
ORAL_TABLET | ORAL | 5 refills | Status: DC
  Filled 2021-05-04: qty 4, 28d supply, fill #0
  Filled 2021-05-27 – 2021-06-02 (×2): qty 4, 28d supply, fill #1
  Filled 2021-06-24: qty 4, 28d supply, fill #2
  Filled 2021-07-23: qty 4, 28d supply, fill #3
  Filled 2021-08-24: qty 4, 28d supply, fill #4
  Filled 2021-09-20: qty 4, 28d supply, fill #5

## 2021-05-05 ENCOUNTER — Other Ambulatory Visit: Payer: Self-pay

## 2021-05-13 ENCOUNTER — Other Ambulatory Visit: Payer: Self-pay

## 2021-05-13 MED FILL — Linaclotide Cap 145 MCG: ORAL | 90 days supply | Qty: 90 | Fill #1 | Status: AC

## 2021-05-27 ENCOUNTER — Other Ambulatory Visit: Payer: Self-pay

## 2021-06-02 ENCOUNTER — Other Ambulatory Visit: Payer: Self-pay

## 2021-06-24 ENCOUNTER — Other Ambulatory Visit: Payer: Self-pay

## 2021-07-07 ENCOUNTER — Other Ambulatory Visit: Payer: Self-pay

## 2021-07-08 ENCOUNTER — Other Ambulatory Visit: Payer: Self-pay

## 2021-07-08 MED ORDER — ALBUTEROL SULFATE HFA 108 (90 BASE) MCG/ACT IN AERS
INHALATION_SPRAY | RESPIRATORY_TRACT | 2 refills | Status: DC
  Filled 2021-07-08: qty 8.5, 17d supply, fill #0
  Filled 2021-09-20 – 2021-09-22 (×2): qty 8.5, 17d supply, fill #1

## 2021-07-09 ENCOUNTER — Other Ambulatory Visit: Payer: Self-pay

## 2021-07-22 ENCOUNTER — Telehealth: Payer: Self-pay | Admitting: Pharmacist

## 2021-07-22 NOTE — Telephone Encounter (Signed)
07/22/2021 1:55:34 PM - Rolan Lipa for [name] Pending -- Arletha Pili - Wednesday, July 22, 2021 1:53 PM --  Received doctor signed portion for Linzess [id number] [employer] application. Holding for patient portion.

## 2021-07-23 MED FILL — Linaclotide Cap 145 MCG: ORAL | 30 days supply | Qty: 30 | Fill #2 | Status: CN

## 2021-07-24 ENCOUNTER — Other Ambulatory Visit: Payer: Self-pay

## 2021-07-30 ENCOUNTER — Other Ambulatory Visit: Payer: Self-pay

## 2021-07-30 MED FILL — Linaclotide Cap 145 MCG: ORAL | 90 days supply | Qty: 90 | Fill #2 | Status: CN

## 2021-08-05 ENCOUNTER — Telehealth: Payer: Self-pay | Admitting: Pharmacist

## 2021-08-05 NOTE — Telephone Encounter (Signed)
08/05/2021 11:32:00 AM - Linzess renewal to [name] -- Arletha Pili - Wednesday, August 05, 2021 11:28 AM --  Faxed renewal app for Linzess 184mcg take 1 tablet daily to [place] [facility].

## 2021-08-11 ENCOUNTER — Other Ambulatory Visit: Payer: Self-pay

## 2021-08-11 MED FILL — Linaclotide Cap 145 MCG: ORAL | 90 days supply | Qty: 90 | Fill #2 | Status: CN

## 2021-08-14 ENCOUNTER — Other Ambulatory Visit: Payer: Self-pay

## 2021-08-17 ENCOUNTER — Other Ambulatory Visit: Payer: Self-pay

## 2021-08-17 MED FILL — Linaclotide Cap 145 MCG: ORAL | 90 days supply | Qty: 90 | Fill #2 | Status: CN

## 2021-08-24 ENCOUNTER — Other Ambulatory Visit: Payer: Self-pay

## 2021-08-24 MED FILL — Linaclotide Cap 145 MCG: ORAL | 90 days supply | Qty: 90 | Fill #2 | Status: CN

## 2021-08-25 ENCOUNTER — Telehealth: Payer: Self-pay | Admitting: Gastroenterology

## 2021-08-25 ENCOUNTER — Other Ambulatory Visit: Payer: Self-pay

## 2021-08-25 NOTE — Telephone Encounter (Signed)
Patient states she cannot get her prescription refilled because they currently do not have it and is wondering if she can get some samples of 145mg  Linzess.

## 2021-08-25 NOTE — Telephone Encounter (Signed)
Appt scheduled for f/u and samples left in the front office for pt to pick up

## 2021-08-30 MED FILL — Linaclotide Cap 145 MCG: ORAL | 90 days supply | Qty: 90 | Fill #2 | Status: CN

## 2021-08-31 ENCOUNTER — Other Ambulatory Visit: Payer: Self-pay

## 2021-09-08 ENCOUNTER — Other Ambulatory Visit: Payer: Self-pay

## 2021-09-08 MED FILL — Linaclotide Cap 145 MCG: ORAL | 90 days supply | Qty: 90 | Fill #2 | Status: CN

## 2021-09-09 ENCOUNTER — Ambulatory Visit: Payer: Self-pay | Admitting: Pharmacy Technician

## 2021-09-09 ENCOUNTER — Other Ambulatory Visit: Payer: Self-pay

## 2021-09-09 DIAGNOSIS — Z79899 Other long term (current) drug therapy: Secondary | ICD-10-CM

## 2021-09-09 NOTE — Progress Notes (Signed)
Assistance with re-certification.  Received updated proof of income.  Patient eligible to receive medication assistance at [facility] until time for re-certification in 1601, and as long as eligibility requirements continue to be met. ? ?Jacquelynn Cree ?Care Manager ?[facility]  ?

## 2021-09-15 ENCOUNTER — Telehealth: Payer: Self-pay | Admitting: Pharmacist

## 2021-09-15 NOTE — Telephone Encounter (Signed)
09/15/2021 12:43:37 PM - Rolan Lipa follow up ?-- Arletha Pili - Tuesday, September 15, 2021 84:53 PM --  ?Application for Linzess 147mg should have been processed on 09/04/21. I spoke with representative [email or web address] he states the application could not be processed because the prescription that Dr. WDurwin Regessigned on 07/14/21 is now past 30 days and a new prescription is required. I am mailing a new script over for Dr. WAllen Norristo sign. ?

## 2021-09-16 ENCOUNTER — Other Ambulatory Visit: Payer: Self-pay

## 2021-09-16 MED FILL — Linaclotide Cap 145 MCG: ORAL | 90 days supply | Qty: 90 | Fill #2 | Status: CN

## 2021-09-17 ENCOUNTER — Other Ambulatory Visit: Payer: Self-pay

## 2021-09-18 ENCOUNTER — Encounter: Payer: Self-pay | Admitting: Gastroenterology

## 2021-09-18 ENCOUNTER — Other Ambulatory Visit: Payer: Self-pay

## 2021-09-18 MED FILL — Linaclotide Cap 145 MCG: ORAL | 90 days supply | Qty: 90 | Fill #2 | Status: CN

## 2021-09-21 ENCOUNTER — Other Ambulatory Visit: Payer: Self-pay

## 2021-09-22 ENCOUNTER — Other Ambulatory Visit: Payer: Self-pay

## 2021-09-23 ENCOUNTER — Other Ambulatory Visit: Payer: Self-pay

## 2021-09-23 ENCOUNTER — Telehealth: Payer: Self-pay | Admitting: Pharmacist

## 2021-09-28 ENCOUNTER — Other Ambulatory Visit: Payer: Self-pay

## 2021-09-28 MED ORDER — ALBUTEROL SULFATE HFA 108 (90 BASE) MCG/ACT IN AERS
INHALATION_SPRAY | RESPIRATORY_TRACT | 2 refills | Status: DC
  Filled 2021-09-28: qty 6.7, 17d supply, fill #0
  Filled 2021-11-26: qty 6.7, 17d supply, fill #1
  Filled 2021-11-27: qty 6.7, 17d supply, fill #0
  Filled 2021-12-09 (×2): qty 6.7, 17d supply, fill #1

## 2021-09-29 ENCOUNTER — Other Ambulatory Visit: Payer: Self-pay

## 2021-09-29 MED FILL — Linaclotide Cap 145 MCG: ORAL | 90 days supply | Qty: 90 | Fill #2 | Status: CN

## 2021-10-06 ENCOUNTER — Other Ambulatory Visit: Payer: Self-pay

## 2021-10-06 MED ORDER — ALENDRONATE SODIUM 70 MG PO TABS
ORAL_TABLET | ORAL | 5 refills | Status: DC
  Filled 2021-10-19: qty 4, 28d supply, fill #0
  Filled 2021-11-10 – 2021-11-12 (×3): qty 4, 28d supply, fill #1
  Filled 2021-11-13: qty 4, 28d supply, fill #0
  Filled 2021-12-09 (×2): qty 4, 28d supply, fill #1

## 2021-10-07 ENCOUNTER — Other Ambulatory Visit: Payer: Self-pay

## 2021-10-07 MED ORDER — BISOPROLOL-HYDROCHLOROTHIAZIDE 5-6.25 MG PO TABS
1.0000 | ORAL_TABLET | Freq: Every day | ORAL | 1 refills | Status: DC
  Filled 2021-10-07: qty 90, 90d supply, fill #0

## 2021-10-07 MED ORDER — LINZESS 145 MCG PO CAPS
ORAL_CAPSULE | Freq: Every day | ORAL | 2 refills | Status: DC
  Filled 2021-10-12: qty 90, 90d supply, fill #0

## 2021-10-07 MED FILL — Linaclotide Cap 145 MCG: ORAL | 90 days supply | Qty: 90 | Fill #2 | Status: CN

## 2021-10-08 ENCOUNTER — Other Ambulatory Visit: Payer: Self-pay

## 2021-10-08 MED ORDER — FENOFIBRATE 48 MG PO TABS
ORAL_TABLET | Freq: Every day | ORAL | 2 refills | Status: DC
  Filled 2021-10-08: qty 30, 30d supply, fill #0
  Filled 2021-11-10 – 2021-11-12 (×3): qty 30, 30d supply, fill #1
  Filled 2021-11-13: qty 30, 30d supply, fill #0
  Filled 2021-12-09 (×2): qty 30, 30d supply, fill #1

## 2021-10-12 ENCOUNTER — Other Ambulatory Visit: Payer: Self-pay

## 2021-10-12 ENCOUNTER — Telehealth (INDEPENDENT_AMBULATORY_CARE_PROVIDER_SITE_OTHER): Payer: Self-pay | Admitting: Gastroenterology

## 2021-10-12 DIAGNOSIS — R7989 Other specified abnormal findings of blood chemistry: Secondary | ICD-10-CM

## 2021-10-12 DIAGNOSIS — K5904 Chronic idiopathic constipation: Secondary | ICD-10-CM

## 2021-10-12 NOTE — Progress Notes (Signed)
?  ?Jill Lame, MD ?[address]  ?[address]  ?[place], [place] [address]  ?Main: [phone]  ?Fax: [phone] ? ? ? ?Gastroenterology Virtual/Video Visit ? ?Referring Provider:     No ref. provider found ?Primary Care Physician:  Patient, No Pcp Per (Inactive) ?Primary Gastroenterologist:  Dr.Nygeria Lager [facility] ?Reason for Consultation:     Medication refill ?      ? HPI:   ? ?Virtual Visit via Video Note ?Location of the patient: Work ?Location of provider: Office  ?Participating persons: The patient and myself. ? ?I connected with Jill King on 10/12/21 at  3:00 PM EDT by a video enabled telemedicine application and verified that I am speaking with the correct person using two identifiers. ?  ?I discussed the limitations of evaluation and management by telemedicine and the availability of in person appointments. The patient expressed understanding and agreed to proceed.  Verbal consent to proceed obtained. ? ?History of Present Illness: ?Jill King is a 61 y.o. female referred by Dr. Patient, No Pcp Per (Inactive)  for consultation & management of chronic constipation with treatment with Linzess.  The patient had a screening colonoscopy in 2017 with 4 polyps removed that were found to be hyperplastic and a repeat colonoscopy was suggested in 10 years.  The patient also had an upper endoscopy that showed increased eosinophils in the esophagus which were nonspecific. ?The patient reports that she is moving her bowels every day while taking Linzess and reports that she has had no further vomiting since starting the medication.  She reports that she has been in good health without any issues.  The patient was noted to have abnormal alkaline phosphatase 2 years ago.  She has not had any follow-up of that. ? ?Past Medical History:  ?Diagnosis Date  ? Arthritis   ? hands, wrist  ? Asthma   ? COPD (chronic obstructive pulmonary disease) ([place])   ? Emphysema of lung ([place])   ? GERD (gastroesophageal reflux disease)   ?  Eosinophilic esophagitis 6948  ? Osteoporosis   ? Wears dentures   ? full upper and lower  ? ? ?Past Surgical History:  ?Procedure Laterality Date  ? CESAREAN SECTION    ? COLONOSCOPY WITH PROPOFOL N/A 01/08/2016  ? Procedure: COLONOSCOPY WITH PROPOFOL;  Surgeon: Jill Lame, MD;  Location: [facility];  Service: Endoscopy;  Laterality: N/A;  ? ESOPHAGOGASTRODUODENOSCOPY (EGD) WITH PROPOFOL N/A 01/08/2016  ? Procedure: ESOPHAGOGASTRODUODENOSCOPY (EGD) WITH PROPOFOL;  Surgeon: Jill Lame, MD;  Location: [facility];  Service: Endoscopy;  Laterality: N/A;  ? MULTIPLE TOOTH EXTRACTIONS    ? POLYPECTOMY  01/08/2016  ? Procedure: POLYPECTOMY;  Surgeon: Jill Lame, MD;  Location: [facility];  Service: Endoscopy;;  ? ? ?Prior to Admission medications   ?Medication Sig Start Date End Date Taking? Authorizing Provider  ?albuterol (PROVENTIL HFA) 108 (90 Base) MCG/ACT inhaler Inhale 2 puffs into the lungs once every 4 to 6 hours as needed. 09/28/21     ?alendronate (FOSAMAX) 70 MG tablet TAKE 1 TABLET BY MOUTH ONCE A WEEK ON SUNDAYS. 10/06/21     ?bisoprolol-hydrochlorothiazide (ZIAC) 5-6.25 MG tablet TAKE 1 TABLET BY MOUTH ONCE DAILY. 10/07/21     ?esomeprazole (NEXIUM) 40 MG capsule Take 40 mg by mouth daily at 12 noon.    [provider]  ?fenofibrate (TRICOR) 48 MG tablet TAKE 1 TABLET BY MOUTH ONCE A DAY 10/08/21     ?gabapentin (NEURONTIN) 100 MG capsule Take 1 capsule (100 mg total)  by mouth 3 (three) times daily as needed for pain. 03/26/21     ?linaclotide (LINZESS) 145 MCG CAPS capsule TAKE 1 CAPSULE BY MOUTH ONCE DAILY FOR CONSTIPATION. 10/07/21     ?naproxen (NAPROSYN) 500 MG tablet Take 1 tablet (500 mg total) by mouth 2 (two) times daily as needed for pain. 03/26/21     ?traMADol (ULTRAM) 50 MG tablet Take 1 tablet (50 mg total) by mouth every 6 (six) hours as needed. ?Patient not taking: Reported on 04/27/2021 03/14/21   Hughie Closs, PA-C  ? ? ?Family History  ?Problem Relation  Age of Onset  ? COPD Mother   ? Diabetes Father   ? Heart disease Father   ? CAD Brother   ?  ? ?Social History  ? ?Tobacco Use  ? Smoking status: Former  ?  Packs/day: 1.00  ?  Years: 30.00  ?  Pack years: 30.00  ?  Types: Cigarettes  ?  Quit date: 12/26/2014  ?  Years since quitting: 6.8  ? Smokeless tobacco: Never  ?Vaping Use  ? Vaping Use: Never used  ?Substance Use Topics  ? Alcohol use: Yes  ?  Comment: 1 drink /2 weeks  ? Drug use: No  ? ? ?Allergies as of 10/12/2021  ? (No Known Allergies)  ? ? ?Review of Systems:    ?All systems reviewed and negative except where noted in HPI. ? ? ?Observations/Objective: ? ?Labs: ?CBC ?   ?Component Value Date/Time  ? WBC 6.5 12/18/2018 2356  ? RBC 4.21 12/18/2018 2356  ? HGB 13.2 12/18/2018 2356  ? HGB 13.5 03/14/2014 0931  ? HCT 37.6 12/18/2018 2356  ? HCT 40.9 03/14/2014 0931  ? PLT 231 12/18/2018 2356  ? PLT 224 03/14/2014 0931  ? MCV 89.3 12/18/2018 2356  ? MCV 96 03/14/2014 0931  ? MCH 31.4 12/18/2018 2356  ? MCHC 35.1 12/18/2018 2356  ? RDW 11.5 12/18/2018 2356  ? RDW 11.9 03/14/2014 0931  ? LYMPHSABS 1.7 03/14/2014 0931  ? MONOABS 0.5 03/14/2014 0931  ? EOSABS 0.3 03/14/2014 0931  ? BASOSABS 0.1 03/14/2014 0931  ? ?CMP  ?   ?Component Value Date/Time  ? NA 135 12/18/2018 2356  ? NA 142 03/14/2014 0931  ? K 4.1 12/18/2018 2356  ? K 4.1 03/14/2014 0931  ? CL 99 12/18/2018 2356  ? CL 109 (H) 03/14/2014 0931  ? CO2 24 12/18/2018 2356  ? CO2 26 03/14/2014 0931  ? GLUCOSE 89 12/18/2018 2356  ? GLUCOSE 75 03/14/2014 0931  ? BUN 9 12/18/2018 2356  ? BUN 11 03/14/2014 0931  ? CREATININE 0.46 12/18/2018 2356  ? CREATININE 0.45 (L) 03/14/2014 0931  ? CALCIUM 9.1 12/18/2018 2356  ? CALCIUM 8.9 03/14/2014 0931  ? PROT 7.6 12/18/2018 2356  ? PROT 7.0 03/14/2014 0931  ? ALBUMIN 4.5 12/18/2018 2356  ? ALBUMIN 3.9 03/14/2014 0931  ? AST 31 12/18/2018 2356  ? AST 34 03/14/2014 0931  ? ALT 33 12/18/2018 2356  ? ALT 30 03/14/2014 0931  ? ALKPHOS 133 (H) 12/18/2018 2356  ? ALKPHOS 129  (H) 03/14/2014 0931  ? BILITOT 0.3 12/18/2018 2356  ? BILITOT 0.3 03/14/2014 0931  ? GFRNONAA >60 12/18/2018 2356  ? GFRNONAA >60 03/14/2014 0931  ? GFRAA >60 12/18/2018 2356  ? GFRAA >60 03/14/2014 0931  ? ? ?Imaging Studies: ?No results found. ? ?Assessment and Plan:  ? ?Jill King is a 61 y.o. y/o female who is being seen for follow-up of her chronic  constipation with the need for refill of her Linzess.  The patient states she has 2 refills at the present time and will contact us when she runs out of it.  The patient will also have her lab sent for a GGT and LFTs to see if her alkaline phosphatase is still elevated.  The patient has been explained the plan and agrees with it ? ?Follow Up Instructions: ? ?I discussed the assessment and treatment plan with the patient. The patient was provided an opportunity to ask questions and all were answered. The patient agreed with the plan and demonstrated an understanding of the instructions. ?  ?The patient was advised to call back or seek an in-person evaluation if the symptoms worsen or if the condition fails to improve as anticipated. ? ?I provided 15 minutes of non-face-to-face time during this encounter. ? ? ?Jill Lame, MD ? ?Speech recognition software was used to dictate the above note.  ?

## 2021-10-19 ENCOUNTER — Other Ambulatory Visit: Payer: Self-pay

## 2021-10-20 LAB — COMPREHENSIVE METABOLIC PANEL
ALT: 27 IU/L (ref 0–32)
AST: 34 IU/L (ref 0–40)
Albumin/Globulin Ratio: 2.1 (ref 1.2–2.2)
Albumin: 4.8 g/dL (ref 3.8–4.9)
Alkaline Phosphatase: 140 IU/L — ABNORMAL HIGH (ref 44–121)
BUN/Creatinine Ratio: 11 (ref 9–23)
BUN: 10 mg/dL (ref 6–24)
Bilirubin Total: 0.5 mg/dL (ref 0.0–1.2)
CO2: 27 mmol/L (ref 20–29)
Calcium: 9.9 mg/dL (ref 8.7–10.2)
Chloride: 98 mmol/L (ref 96–106)
Creatinine, Ser: 0.92 mg/dL (ref 0.57–1.00)
Globulin, Total: 2.3 g/dL (ref 1.5–4.5)
Glucose: 106 mg/dL — ABNORMAL HIGH (ref 70–99)
Potassium: 4.2 mmol/L (ref 3.5–5.2)
Sodium: 138 mmol/L (ref 134–144)
Total Protein: 7.1 g/dL (ref 6.0–8.5)
eGFR: 72 mL/min/{1.73_m2} (ref >59)

## 2021-10-20 LAB — GAMMA GT: GGT: 91 IU/L — ABNORMAL HIGH (ref 0–60)

## 2021-10-29 ENCOUNTER — Other Ambulatory Visit: Payer: Self-pay

## 2021-11-11 ENCOUNTER — Other Ambulatory Visit: Payer: Self-pay

## 2021-11-12 ENCOUNTER — Other Ambulatory Visit: Payer: Self-pay

## 2021-11-13 ENCOUNTER — Other Ambulatory Visit: Payer: Self-pay

## 2021-11-17 ENCOUNTER — Other Ambulatory Visit: Payer: Self-pay

## 2021-11-27 ENCOUNTER — Other Ambulatory Visit: Payer: Self-pay

## 2021-11-29 ENCOUNTER — Other Ambulatory Visit: Payer: Self-pay

## 2021-11-29 ENCOUNTER — Encounter: Payer: Self-pay | Admitting: Emergency Medicine

## 2021-11-29 ENCOUNTER — Ambulatory Visit
Admission: EM | Admit: 2021-11-29 | Discharge: 2021-11-29 | Disposition: A | Discharge status: Home / Self Care | Payer: Self-pay | Attending: Emergency Medicine | Admitting: Emergency Medicine

## 2021-11-29 DIAGNOSIS — J0100 Acute maxillary sinusitis, unspecified: Secondary | ICD-10-CM

## 2021-11-29 DIAGNOSIS — J441 Chronic obstructive pulmonary disease with (acute) exacerbation: Secondary | ICD-10-CM

## 2021-11-29 MED ORDER — PREDNISONE 20 MG PO TABS: 40.0000 mg | ORAL_TABLET | Freq: Every day | ORAL | 0 refills | Status: AC

## 2021-11-29 MED ORDER — PREDNISONE 20 MG PO TABS
40.0000 mg | ORAL_TABLET | Freq: Once | ORAL | Status: AC
  Administered 2021-11-29: 40 mg via ORAL

## 2021-11-29 MED ORDER — FLUTICASONE PROPIONATE 50 MCG/ACT NA SUSP: 2.0000 | Freq: Every day | NASAL | 0 refills | Status: DC

## 2021-11-29 MED ORDER — AEROCHAMBER MV MISC: 1 refills | Status: DC

## 2021-11-29 MED ORDER — IPRATROPIUM-ALBUTEROL 0.5-2.5 (3) MG/3ML IN SOLN
3.0000 mL | Freq: Once | RESPIRATORY_TRACT | Status: AC
  Administered 2021-11-29: 3 mL via RESPIRATORY_TRACT

## 2021-11-29 MED ORDER — PROMETHAZINE-DM 6.25-15 MG/5ML PO SYRP: 5.0000 mL | ORAL_SOLUTION | Freq: Four times a day (QID) | ORAL | 0 refills | Status: DC | PRN

## 2021-11-29 MED ORDER — AMOXICILLIN-POT CLAVULANATE 875-125 MG PO TABS: 1.0000 | ORAL_TABLET | Freq: Two times a day (BID) | ORAL | 0 refills | Status: DC

## 2021-11-29 NOTE — ED Triage Notes (Signed)
Patient c/o cough, congestion, sinus pressure and nasal congestion that has gotten worse over the past week.  Patient reports low grade fevers.

## 2021-11-29 NOTE — Discharge Instructions (Addendum)
2 puffs from your albuterol inhaler using your spacer every 4 hours for 2 days, then every 6 hours for 2 days, then as needed.  You can back off on the albuterol if you start to improve sooner.  Finish prednisone, and the Augmentin even if you feel better.  Flonase, saline nasal irrigation with a Milta Deiters Med rinse and distilled water as often as you want, continue Mucinex.  Promethazine DM as needed for cough.  Follow-up with your primary care provider as needed.  Go to the ER if you get worse.

## 2021-11-29 NOTE — ED Provider Notes (Signed)
HPI  SUBJECTIVE:  Jill King is a 61 y.o. female who presents with 1 week of chest congestion, tightness.  She states that this is getting better, and that she now has congestion in her sinuses.  She reports nasal congestion, bilateral ear fullness/popping, postnasal drip, wheezing, shortness of breath, dyspnea on exertion.  She reports increased color and amount of sputum.  She states that her cough is now productive of yellow mucus.  No fevers, rhinorrhea, sinus pain or pressure, facial swelling, dental pain, vertigo, ear pain.  She has been using her albuterol 4 times a day, usually needs it as needed.  She does not have a spacer.  She has also tried Sudafed, Advil flu and Mucinex, Neo-Synephrine nasal spray.  The albuterol and Mucinex helps.  No aggravating factors.  She is unable to sleep at night secondary to the cough.  She states that her chest is sore from coughing.  No antibiotics in the past month.  No antipyretic in the past 6 hours.  She has a past medical history of asthma/COPD, quit smoking 7 years ago, hypertension, hypercholesterolemia.  PCP: [facility]    Past Medical History:  Diagnosis Date   Arthritis    hands, wrist   Asthma    COPD (chronic obstructive pulmonary disease) ([place])    Emphysema of lung (HCC)    GERD (gastroesophageal reflux disease)    Eosinophilic esophagitis 8756   Osteoporosis    Wears dentures    full upper and lower    Past Surgical History:  Procedure Laterality Date   CESAREAN SECTION     COLONOSCOPY WITH PROPOFOL N/A 01/08/2016   Procedure: COLONOSCOPY WITH PROPOFOL;  Surgeon: Lucilla Lame, MD;  Location: [facility];  Service: Endoscopy;  Laterality: N/A;   ESOPHAGOGASTRODUODENOSCOPY (EGD) WITH PROPOFOL N/A 01/08/2016   Procedure: ESOPHAGOGASTRODUODENOSCOPY (EGD) WITH PROPOFOL;  Surgeon: Lucilla Lame, MD;  Location: [facility];  Service: Endoscopy;  Laterality: N/A;   MULTIPLE TOOTH EXTRACTIONS     POLYPECTOMY  01/08/2016    Procedure: POLYPECTOMY;  Surgeon: Lucilla Lame, MD;  Location: [facility];  Service: Endoscopy;;    Family History  Problem Relation Age of Onset   COPD Mother    Diabetes Father    Heart disease Father    CAD Brother     Social History   Tobacco Use   Smoking status: Former    Packs/day: 1.00    Years: 30.00    Pack years: 30.00    Types: Cigarettes    Quit date: 12/26/2014    Years since quitting: 6.9   Smokeless tobacco: Never  Vaping Use   Vaping Use: Never used  Substance Use Topics   Alcohol use: Yes    Comment: 1 drink /2 weeks   Drug use: No    No current facility-administered medications for this encounter.  Current Outpatient Medications:    alendronate (FOSAMAX) 70 MG tablet, TAKE 1 TABLET BY MOUTH ONCE A WEEK ON SUNDAYS., Disp: 4 tablet, Rfl: 5   amoxicillin-clavulanate (AUGMENTIN) 875-125 MG tablet, Take 1 tablet by mouth every 12 (twelve) hours., Disp: 14 tablet, Rfl: 0   bisoprolol-hydrochlorothiazide (ZIAC) 5-6.25 MG tablet, TAKE 1 TABLET BY MOUTH ONCE DAILY., Disp: 90 tablet, Rfl: 1   esomeprazole (NEXIUM) 40 MG capsule, Take 40 mg by mouth daily at 12 noon., Disp: , Rfl:    fenofibrate (TRICOR) 48 MG tablet, TAKE 1 TABLET BY MOUTH ONCE A DAY, Disp: 90 tablet, Rfl: 2   fluticasone (FLONASE)  50 MCG/ACT nasal spray, Place 2 sprays into both nostrils daily., Disp: 16 g, Rfl: 0   linaclotide (LINZESS) 145 MCG CAPS capsule, TAKE 1 CAPSULE BY MOUTH ONCE DAILY FOR CONSTIPATION., Disp: 90 capsule, Rfl: 2   predniSONE (DELTASONE) 20 MG tablet, Take 2 tablets (40 mg total) by mouth daily with breakfast for 5 days., Disp: 10 tablet, Rfl: 0   promethazine-dextromethorphan (PROMETHAZINE-DM) 6.25-15 MG/5ML syrup, Take 5 mLs by mouth 4 (four) times daily as needed for cough., Disp: 118 mL, Rfl: 0   Spacer/Aero-Holding Chambers (AEROCHAMBER MV) inhaler, Use as instructed, Disp: 1 each, Rfl: 1   albuterol (PROVENTIL HFA) 108 (90 Base) MCG/ACT inhaler, Inhale 2 puffs  into the lungs once every 4 to 6 hours as needed., Disp: 6.7 g, Rfl: 2   gabapentin (NEURONTIN) 100 MG capsule, Take 1 capsule (100 mg total) by mouth 3 (three) times daily as needed for pain., Disp: 90 capsule, Rfl: 0  No Known Allergies   ROS  As noted in HPI.   Physical Exam  BP (!) 142/70 (BP Location: Left Arm)   Pulse 91   Temp 98.4 F (36.9 C) (Oral)   Resp 14   Ht '5\' 5"'$  (1.651 m)   Wt 52.6 kg   SpO2 98%   BMI 19.30 kg/m   Constitutional: Well developed, well nourished, no acute distress.  Coughing. Eyes:  EOMI, conjunctiva normal bilaterally HENT: Normocephalic, atraumatic,mucus membranes moist.  TMs normal bilaterally.  No nasal congestion.  Normal turbinates.  Positive maxillary sinus tenderness.  No frontal sinus tenderness.  Positive extensive postnasal drip. Respiratory: Normal inspiratory effort.  Poor air movement, diffuse expiratory wheezing throughout all lung fields.  Positive anterior, lateral chest wall tenderness Cardiovascular: Normal rate, regular rhythm, no murmurs rubs or gallops GI: nondistended, skin: No rash, skin intact Musculoskeletal: no deformities Neurologic: Alert & oriented x 3, no focal neuro deficits Psychiatric: Speech and behavior appropriate   ED Course   Medications  ipratropium-albuterol (DUONEB) 0.5-2.5 (3) MG/3ML nebulizer solution 3 mL (3 mLs Nebulization Given 11/29/21 1301)  predniSONE (DELTASONE) tablet 40 mg (40 mg Oral Given 11/29/21 1301)    No orders of the defined types were placed in this encounter.   No results found for this or any previous visit (from the past 24 hour(s)). No results found.  ED Clinical Impression  1. COPD exacerbation ([place])   2. Acute non-recurrent maxillary sinusitis      ED Assessment/Plan  Giving DuoNeb and 40 mg of prednisone here as she has poor air movement.  However, she is not in any respiratory distress.  Patient states she feels better post nebulizer treatment.  Patient with a  COPD exacerbation and maxillary sinusitis.  Home with regularly scheduled albuterol with a spacer.  She does not need to refill on her albuterol.  Prednisone 40 mg for 5 days, Augmentin, Flonase, saline nasal irrigation, Promethazine DM for the cough.  Follow-up with PCP as needed.    Discussed MDM, treatment plan, and plan for follow-up with patient.. patient agrees with plan.   Meds ordered this encounter  Medications   ipratropium-albuterol (DUONEB) 0.5-2.5 (3) MG/3ML nebulizer solution 3 mL   predniSONE (DELTASONE) tablet 40 mg   amoxicillin-clavulanate (AUGMENTIN) 875-125 MG tablet    Sig: Take 1 tablet by mouth every 12 (twelve) hours.    Dispense:  14 tablet    Refill:  0   fluticasone (FLONASE) 50 MCG/ACT nasal spray    Sig: Place 2 sprays into both nostrils daily.  Dispense:  16 g    Refill:  0   predniSONE (DELTASONE) 20 MG tablet    Sig: Take 2 tablets (40 mg total) by mouth daily with breakfast for 5 days.    Dispense:  10 tablet    Refill:  0   promethazine-dextromethorphan (PROMETHAZINE-DM) 6.25-15 MG/5ML syrup    Sig: Take 5 mLs by mouth 4 (four) times daily as needed for cough.    Dispense:  118 mL    Refill:  0   Spacer/Aero-Holding Chambers (AEROCHAMBER MV) inhaler    Sig: Use as instructed    Dispense:  1 each    Refill:  1      *This clinic note was created using Dragon dictation software. Therefore, there may be occasional mistakes despite careful proofreading.  ?    Melynda Ripple, MD 11/29/21 1709

## 2021-12-09 ENCOUNTER — Other Ambulatory Visit: Payer: Self-pay

## 2021-12-11 ENCOUNTER — Other Ambulatory Visit: Payer: Self-pay

## 2021-12-14 ENCOUNTER — Other Ambulatory Visit: Payer: Self-pay

## 2021-12-23 ENCOUNTER — Telehealth: Payer: Self-pay | Admitting: Gastroenterology

## 2021-12-23 ENCOUNTER — Other Ambulatory Visit: Payer: Self-pay

## 2021-12-23 MED ORDER — LINACLOTIDE 145 MCG PO CAPS
ORAL_CAPSULE | Freq: Every day | ORAL | 2 refills | Status: DC
  Filled 2021-12-23: qty 90, fill #0
  Filled 2021-12-24: qty 90, 90d supply, fill #0
  Filled 2021-12-24 – 2022-01-05 (×2): qty 90, fill #0
  Filled 2022-01-05: qty 90, 90d supply, fill #0
  Filled 2022-03-15 – 2022-04-05 (×3): qty 90, 90d supply, fill #1
  Filled 2022-06-16 – 2022-06-18 (×3): qty 90, 90d supply, fill #2

## 2021-12-23 NOTE — Telephone Encounter (Signed)
Pt stated since Medication mgmt changed to [facility] pharmacy, they will need the Rx sent to the pharmacy again... Rx sent through e-scribe

## 2021-12-23 NOTE — Telephone Encounter (Signed)
Pt is requesting prescription for linzess would like a call back at [phone]

## 2021-12-23 NOTE — Addendum Note (Signed)
Addended by: Lurlean Nanny on: 12/23/2021 02:43 PM   Modules accepted: Orders

## 2021-12-24 ENCOUNTER — Other Ambulatory Visit: Payer: Self-pay

## 2021-12-28 ENCOUNTER — Other Ambulatory Visit: Payer: Self-pay

## 2021-12-30 ENCOUNTER — Other Ambulatory Visit: Payer: Self-pay

## 2021-12-31 ENCOUNTER — Other Ambulatory Visit: Payer: Self-pay

## 2022-01-01 ENCOUNTER — Other Ambulatory Visit: Payer: Self-pay

## 2022-01-05 ENCOUNTER — Other Ambulatory Visit: Payer: Self-pay

## 2022-01-11 ENCOUNTER — Other Ambulatory Visit: Payer: Self-pay

## 2022-01-25 ENCOUNTER — Emergency Department: Payer: Self-pay

## 2022-01-25 ENCOUNTER — Observation Stay: Payer: Self-pay

## 2022-01-25 ENCOUNTER — Observation Stay (HOSPITAL_BASED_OUTPATIENT_CLINIC_OR_DEPARTMENT_OTHER)
Admit: 2022-01-25 | Discharge: 2022-01-25 | Disposition: A | Discharge status: Home / Self Care | Payer: Self-pay | Attending: Internal Medicine | Admitting: Internal Medicine

## 2022-01-25 ENCOUNTER — Other Ambulatory Visit: Payer: Self-pay

## 2022-01-25 ENCOUNTER — Observation Stay
Admission: EM | Admit: 2022-01-25 | Discharge: 2022-01-26 | Disposition: A | Discharge status: Home / Self Care | Payer: Self-pay | Attending: Internal Medicine | Admitting: Internal Medicine

## 2022-01-25 ENCOUNTER — Encounter: Payer: Self-pay | Admitting: Internal Medicine

## 2022-01-25 DIAGNOSIS — R55 Syncope and collapse: Principal | ICD-10-CM

## 2022-01-25 DIAGNOSIS — W228XXA Striking against or struck by other objects, initial encounter: Secondary | ICD-10-CM | POA: Insufficient documentation

## 2022-01-25 DIAGNOSIS — R9431 Abnormal electrocardiogram [ECG] [EKG]: Secondary | ICD-10-CM

## 2022-01-25 DIAGNOSIS — J449 Chronic obstructive pulmonary disease, unspecified: Secondary | ICD-10-CM | POA: Insufficient documentation

## 2022-01-25 DIAGNOSIS — Y92000 Kitchen of unspecified non-institutional (private) residence as  the place of occurrence of the external cause: Secondary | ICD-10-CM | POA: Insufficient documentation

## 2022-01-25 DIAGNOSIS — S0990XA Unspecified injury of head, initial encounter: Secondary | ICD-10-CM

## 2022-01-25 DIAGNOSIS — Z789 Other specified health status: Secondary | ICD-10-CM | POA: Diagnosis present

## 2022-01-25 DIAGNOSIS — E876 Hypokalemia: Secondary | ICD-10-CM | POA: Insufficient documentation

## 2022-01-25 DIAGNOSIS — Z87891 Personal history of nicotine dependence: Secondary | ICD-10-CM | POA: Insufficient documentation

## 2022-01-25 DIAGNOSIS — M545 Low back pain, unspecified: Secondary | ICD-10-CM

## 2022-01-25 DIAGNOSIS — Z23 Encounter for immunization: Secondary | ICD-10-CM | POA: Insufficient documentation

## 2022-01-25 DIAGNOSIS — G8929 Other chronic pain: Secondary | ICD-10-CM | POA: Diagnosis present

## 2022-01-25 DIAGNOSIS — I10 Essential (primary) hypertension: Secondary | ICD-10-CM | POA: Diagnosis present

## 2022-01-25 DIAGNOSIS — K219 Gastro-esophageal reflux disease without esophagitis: Secondary | ICD-10-CM | POA: Diagnosis present

## 2022-01-25 DIAGNOSIS — Y9301 Activity, walking, marching and hiking: Secondary | ICD-10-CM | POA: Insufficient documentation

## 2022-01-25 DIAGNOSIS — S0101XA Laceration without foreign body of scalp, initial encounter: Secondary | ICD-10-CM

## 2022-01-25 DIAGNOSIS — J45909 Unspecified asthma, uncomplicated: Secondary | ICD-10-CM | POA: Insufficient documentation

## 2022-01-25 DIAGNOSIS — R0602 Shortness of breath: Secondary | ICD-10-CM

## 2022-01-25 DIAGNOSIS — Z79899 Other long term (current) drug therapy: Secondary | ICD-10-CM | POA: Insufficient documentation

## 2022-01-25 DIAGNOSIS — E785 Hyperlipidemia, unspecified: Secondary | ICD-10-CM | POA: Diagnosis present

## 2022-01-25 DIAGNOSIS — K200 Eosinophilic esophagitis: Secondary | ICD-10-CM | POA: Diagnosis present

## 2022-01-25 HISTORY — DX: Alcohol use, unspecified, uncomplicated: F10.90

## 2022-01-25 HISTORY — DX: Other specified health status: Z78.9

## 2022-01-25 LAB — CBC
HCT: 37.8 % (ref 36.0–46.0)
Hemoglobin: 13.2 g/dL (ref 12.0–15.0)
MCH: 31.1 pg (ref 26.0–34.0)
MCHC: 34.9 g/dL (ref 30.0–36.0)
MCV: 89.2 fL (ref 80.0–100.0)
Platelets: 237 10*3/uL (ref 150–400)
RBC: 4.24 MIL/uL (ref 3.87–5.11)
RDW: 10.8 % — ABNORMAL LOW (ref 11.5–15.5)
WBC: 5 10*3/uL (ref 4.0–10.5)
nRBC: 0 % (ref 0.0–0.2)

## 2022-01-25 LAB — BASIC METABOLIC PANEL
Anion gap: 9 (ref 5–15)
BUN: 7 mg/dL (ref 6–20)
CO2: 22 mmol/L (ref 22–32)
Calcium: 9.4 mg/dL (ref 8.9–10.3)
Chloride: 108 mmol/L (ref 98–111)
Creatinine, Ser: 0.64 mg/dL (ref 0.44–1.00)
GFR, Estimated: >60 mL/min (ref >60)
Glucose, Bld: 105 mg/dL — ABNORMAL HIGH (ref 70–99)
Potassium: 3.2 mmol/L — ABNORMAL LOW (ref 3.5–5.1)
Sodium: 139 mmol/L (ref 135–145)

## 2022-01-25 LAB — D-DIMER, QUANTITATIVE: D-Dimer, Quant: 0.36 ug/mL-FEU (ref 0.00–0.50)

## 2022-01-25 LAB — ECHOCARDIOGRAM COMPLETE
AR max vel: 2.49 cm2
AV Area VTI: 2.29 cm2
AV Area mean vel: 2.4 cm2
AV Mean grad: 4 mmHg
AV Peak grad: 8.5 mmHg
Ao pk vel: 1.46 m/s
Area-P 1/2: 3.76 cm2
Height: 65 in
S' Lateral: 3.09 cm
Weight: 1952 oz

## 2022-01-25 LAB — ETHANOL: Alcohol, Ethyl (B): <10 mg/dL (ref <10)

## 2022-01-25 LAB — HIV ANTIBODY (ROUTINE TESTING W REFLEX): HIV Screen 4th Generation wRfx: NONREACTIVE

## 2022-01-25 LAB — TROPONIN I (HIGH SENSITIVITY)
Troponin I (High Sensitivity): 3 ng/L (ref <18)
Troponin I (High Sensitivity): 3 ng/L (ref <18)

## 2022-01-25 LAB — MAGNESIUM: Magnesium: 2 mg/dL (ref 1.7–2.4)

## 2022-01-25 MED ORDER — ALBUTEROL SULFATE (2.5 MG/3ML) 0.083% IN NEBU: 3.0000 mL | INHALATION_SOLUTION | RESPIRATORY_TRACT | Status: DC | PRN

## 2022-01-25 MED ORDER — HYDRALAZINE HCL 20 MG/ML IJ SOLN
5.0000 mg | INTRAMUSCULAR | Status: DC | PRN
Start: 2022-01-25 — End: 2022-01-26

## 2022-01-25 MED ORDER — BISOPROLOL-HYDROCHLOROTHIAZIDE 5-6.25 MG PO TABS
1.0000 | ORAL_TABLET | Freq: Every day | ORAL | Status: DC
  Administered 2022-01-25 – 2022-01-26 (×2): 1 via ORAL
  Filled 2022-01-25 (×2): qty 1

## 2022-01-25 MED ORDER — TETANUS-DIPHTH-ACELL PERTUSSIS 5-2.5-18.5 LF-MCG/0.5 IM SUSY
0.5000 mL | PREFILLED_SYRINGE | Freq: Once | INTRAMUSCULAR | Status: AC
  Administered 2022-01-25: 0.5 mL via INTRAMUSCULAR
  Filled 2022-01-25: qty 0.5

## 2022-01-25 MED ORDER — IPRATROPIUM-ALBUTEROL 0.5-2.5 (3) MG/3ML IN SOLN
3.0000 mL | Freq: Once | RESPIRATORY_TRACT | Status: AC
  Administered 2022-01-25: 3 mL via RESPIRATORY_TRACT
  Filled 2022-01-25: qty 3

## 2022-01-25 MED ORDER — LINACLOTIDE 145 MCG PO CAPS
145.0000 ug | ORAL_CAPSULE | Freq: Every day | ORAL | Status: DC
Start: 2022-01-25 — End: 2022-01-26
  Administered 2022-01-25 – 2022-01-26 (×2): 145 ug via ORAL
  Filled 2022-01-25 (×2): qty 1

## 2022-01-25 MED ORDER — FENTANYL CITRATE PF 50 MCG/ML IJ SOSY
50.0000 ug | PREFILLED_SYRINGE | Freq: Once | INTRAMUSCULAR | Status: AC
  Administered 2022-01-25: 50 ug via INTRAVENOUS
  Filled 2022-01-25: qty 1

## 2022-01-25 MED ORDER — LIDOCAINE HCL (PF) 1 % IJ SOLN
5.0000 mL | Freq: Once | INTRAMUSCULAR | Status: AC
  Administered 2022-01-25: 5 mL via INTRADERMAL
  Filled 2022-01-25: qty 5

## 2022-01-25 MED ORDER — ONDANSETRON HCL 4 MG/2ML IJ SOLN: 4.0000 mg | Freq: Three times a day (TID) | INTRAMUSCULAR | Status: DC | PRN

## 2022-01-25 MED ORDER — FOLIC ACID 1 MG PO TABS
1.0000 mg | ORAL_TABLET | Freq: Every day | ORAL | Status: DC
  Administered 2022-01-25 – 2022-01-26 (×2): 1 mg via ORAL
  Filled 2022-01-25 (×2): qty 1

## 2022-01-25 MED ORDER — POTASSIUM CHLORIDE CRYS ER 20 MEQ PO TBCR
40.0000 meq | EXTENDED_RELEASE_TABLET | Freq: Once | ORAL | Status: AC
  Administered 2022-01-25: 40 meq via ORAL

## 2022-01-25 MED ORDER — IPRATROPIUM-ALBUTEROL 0.5-2.5 (3) MG/3ML IN SOLN
3.0000 mL | Freq: Two times a day (BID) | RESPIRATORY_TRACT | Status: DC
  Administered 2022-01-26: 3 mL via RESPIRATORY_TRACT
  Filled 2022-01-25: qty 3

## 2022-01-25 MED ORDER — PANTOPRAZOLE SODIUM 40 MG PO TBEC
40.0000 mg | DELAYED_RELEASE_TABLET | Freq: Every day | ORAL | Status: DC
  Administered 2022-01-25 – 2022-01-26 (×2): 40 mg via ORAL
  Filled 2022-01-25 (×2): qty 1

## 2022-01-25 MED ORDER — LORAZEPAM 2 MG/ML IJ SOLN: 0.0000 mg | Freq: Two times a day (BID) | INTRAMUSCULAR | Status: DC

## 2022-01-25 MED ORDER — DM-GUAIFENESIN ER 30-600 MG PO TB12
1.0000 | ORAL_TABLET | Freq: Two times a day (BID) | ORAL | Status: DC | PRN
  Administered 2022-01-26: 1 via ORAL
  Filled 2022-01-25: qty 1

## 2022-01-25 MED ORDER — IPRATROPIUM-ALBUTEROL 0.5-2.5 (3) MG/3ML IN SOLN
3.0000 mL | Freq: Four times a day (QID) | RESPIRATORY_TRACT | Status: DC
  Administered 2022-01-25 (×2): 3 mL via RESPIRATORY_TRACT
  Filled 2022-01-25 (×2): qty 3

## 2022-01-25 MED ORDER — MELATONIN 5 MG PO TABS
5.0000 mg | ORAL_TABLET | Freq: Once | ORAL | Status: AC
  Administered 2022-01-25: 5 mg via ORAL
  Filled 2022-01-25: qty 1

## 2022-01-25 MED ORDER — ENOXAPARIN SODIUM 40 MG/0.4ML IJ SOSY
40.0000 mg | PREFILLED_SYRINGE | INTRAMUSCULAR | Status: DC
  Administered 2022-01-25 – 2022-01-26 (×2): 40 mg via SUBCUTANEOUS
  Filled 2022-01-25 (×2): qty 0.4

## 2022-01-25 MED ORDER — LORAZEPAM 1 MG PO TABS
1.0000 mg | ORAL_TABLET | ORAL | Status: DC | PRN
  Administered 2022-01-25: 2 mg via ORAL
  Filled 2022-01-25: qty 2

## 2022-01-25 MED ORDER — SODIUM CHLORIDE 0.9 % IV SOLN: INTRAVENOUS | Status: DC

## 2022-01-25 MED ORDER — THIAMINE HCL 100 MG/ML IJ SOLN: 100.0000 mg | Freq: Every day | INTRAMUSCULAR | Status: DC

## 2022-01-25 MED ORDER — THIAMINE HCL 100 MG PO TABS
100.0000 mg | ORAL_TABLET | Freq: Every day | ORAL | Status: DC
  Administered 2022-01-26: 100 mg via ORAL
  Filled 2022-01-25 (×2): qty 1

## 2022-01-25 MED ORDER — ONDANSETRON HCL 4 MG/2ML IJ SOLN
4.0000 mg | Freq: Once | INTRAMUSCULAR | Status: AC
  Administered 2022-01-25: 4 mg via INTRAVENOUS
  Filled 2022-01-25: qty 2

## 2022-01-25 MED ORDER — LIDOCAINE 5 % EX PTCH
1.0000 | MEDICATED_PATCH | CUTANEOUS | Status: DC
  Administered 2022-01-25 – 2022-01-26 (×2): 1 via TRANSDERMAL
  Filled 2022-01-25 (×2): qty 1

## 2022-01-25 MED ORDER — ADULT MULTIVITAMIN W/MINERALS CH
1.0000 | ORAL_TABLET | Freq: Every day | ORAL | Status: DC
  Administered 2022-01-25 – 2022-01-26 (×2): 1 via ORAL
  Filled 2022-01-25 (×2): qty 1

## 2022-01-25 MED ORDER — FENOFIBRATE 54 MG PO TABS
54.0000 mg | ORAL_TABLET | Freq: Every day | ORAL | Status: DC
  Administered 2022-01-25 – 2022-01-26 (×2): 54 mg via ORAL
  Filled 2022-01-25 (×2): qty 1

## 2022-01-25 MED ORDER — LORAZEPAM 2 MG/ML IJ SOLN: 0.0000 mg | Freq: Four times a day (QID) | INTRAMUSCULAR | Status: DC

## 2022-01-25 MED ORDER — OXYCODONE-ACETAMINOPHEN 5-325 MG PO TABS: 1.0000 | ORAL_TABLET | ORAL | Status: DC | PRN

## 2022-01-25 MED ORDER — LORAZEPAM 2 MG/ML IJ SOLN: 1.0000 mg | INTRAMUSCULAR | Status: DC | PRN

## 2022-01-25 MED ORDER — SODIUM CHLORIDE 0.9 % IV BOLUS
1000.0000 mL | Freq: Once | INTRAVENOUS | Status: AC
  Administered 2022-01-25: 1000 mL via INTRAVENOUS

## 2022-01-25 MED ORDER — ACETAMINOPHEN 325 MG PO TABS
650.0000 mg | ORAL_TABLET | Freq: Four times a day (QID) | ORAL | Status: DC | PRN
  Administered 2022-01-25 – 2022-01-26 (×2): 650 mg via ORAL
  Filled 2022-01-25 (×2): qty 2

## 2022-01-25 NOTE — Assessment & Plan Note (Addendum)
Continue PPI ?

## 2022-01-25 NOTE — Progress Notes (Signed)
Admission profile updated. ?

## 2022-01-25 NOTE — ED Notes (Signed)
Rn aware bed assigned 

## 2022-01-25 NOTE — Assessment & Plan Note (Addendum)
Laceration is sutured up by ED physician.  No active bleeding. - Wound care consulted, appreciate recommendations - see WOC consult note

## 2022-01-25 NOTE — Assessment & Plan Note (Addendum)
-   [place]

## 2022-01-25 NOTE — Assessment & Plan Note (Addendum)
Due to chronic degenerative disc disease and chronic compression fracture.  Stable -As needed Tylenol -Lidoderm ordered

## 2022-01-25 NOTE — ED Triage Notes (Signed)
Pt states she fainted in kitchen today at 0400 striking head on cabinet. Pt states yesterday she was having shob, history of copd, pt with abrasion/laceration noted to posterior scalp. Pt denies neck pain.

## 2022-01-25 NOTE — ED Provider Notes (Signed)
[facility] Provider Note    Event Date/Time   First MD Initiated Contact with Patient 01/25/22 0530     (approximate)   History   Loss of Consciousness and Head Injury   HPI  Halayna Blane is a 61 y.o. female with history of asthma and COPD, hypertension, hyperlipidemia, former tobacco use who quit 7 years ago who presents to the emergency department after a syncopal event.  States yesterday she started feeling short of breath and used her albuterol inhaler without much relief.  No chest pain or chest discomfort.  States tonight she got up from bed, went to the bathroom and was walking into the kitchen and was standing in the kitchen for some time prior to suddenly passing out.  States she thinks she hit her on the cabinet and has a laceration to the posterior scalp.  Unsure of her last tetanus vaccine.  Is complaining of mid and lower back pain but no neck pain.  Not on blood thinners.  No numbness, tingling or weakness.  Still feeling short of breath this time.  No known history of heart disease, arrhythmia, diabetes.   History provided by patient and family.    Past Medical History:  Diagnosis Date   Arthritis    hands, wrist   Asthma    COPD (chronic obstructive pulmonary disease) (HCC)    Emphysema of lung (HCC)    GERD (gastroesophageal reflux disease)    Eosinophilic esophagitis 3151   Osteoporosis    Wears dentures    full upper and lower    Past Surgical History:  Procedure Laterality Date   CESAREAN SECTION     COLONOSCOPY WITH PROPOFOL N/A 01/08/2016   Procedure: COLONOSCOPY WITH PROPOFOL;  Surgeon: Lucilla Lame, MD;  Location: [place];  Service: Endoscopy;  Laterality: N/A;   ESOPHAGOGASTRODUODENOSCOPY (EGD) WITH PROPOFOL N/A 01/08/2016   Procedure: ESOPHAGOGASTRODUODENOSCOPY (EGD) WITH PROPOFOL;  Surgeon: Lucilla Lame, MD;  Location: [place];  Service: Endoscopy;  Laterality: N/A;   MULTIPLE TOOTH EXTRACTIONS      POLYPECTOMY  01/08/2016   Procedure: POLYPECTOMY;  Surgeon: Lucilla Lame, MD;  Location: [place];  Service: Endoscopy;;    MEDICATIONS:  Prior to Admission medications   Medication Sig Start Date End Date Taking? Authorizing Provider  albuterol (PROVENTIL HFA) 108 (90 Base) MCG/ACT inhaler Inhale 2 puffs into the lungs once every 4 to 6 hours as needed. 09/28/21     alendronate (FOSAMAX) 70 MG tablet TAKE 1 TABLET BY MOUTH ONCE A WEEK ON SUNDAYS. 10/06/21     amoxicillin-clavulanate (AUGMENTIN) 875-125 MG tablet Take 1 tablet by mouth every 12 (twelve) hours. 11/29/21   Melynda Ripple, MD  bisoprolol-hydrochlorothiazide (ZIAC) 5-6.25 MG tablet TAKE 1 TABLET BY MOUTH ONCE DAILY. 10/07/21     esomeprazole (NEXIUM) 40 MG capsule Take 40 mg by mouth daily at 12 noon.    [provider]  fenofibrate (TRICOR) 48 MG tablet TAKE 1 TABLET BY MOUTH ONCE A DAY 10/08/21     fluticasone (FLONASE) 50 MCG/ACT nasal spray Place 2 sprays into both nostrils daily. 11/29/21   Melynda Ripple, MD  gabapentin (NEURONTIN) 100 MG capsule Take 1 capsule (100 mg total) by mouth 3 (three) times daily as needed for pain. 03/26/21     linaclotide (LINZESS) 145 MCG CAPS capsule TAKE 1 CAPSULE BY MOUTH ONCE DAILY FOR CONSTIPATION. 12/23/21   Lucilla Lame, MD  promethazine-dextromethorphan (PROMETHAZINE-DM) 6.25-15 MG/5ML syrup Take 5 mLs by mouth 4 (four)  times daily as needed for cough. 11/29/21   Melynda Ripple, MD  Spacer/Aero-Holding Chambers (AEROCHAMBER MV) inhaler Use as instructed 11/29/21   Melynda Ripple, MD    Physical Exam   Triage Vital Signs: ED Triage Vitals  Enc Vitals Group     BP 01/25/22 0524 (!) 152/79     Pulse Rate 01/25/22 0524 73     Resp 01/25/22 0524 16     Temp 01/25/22 0524 98.5 F (36.9 C)     Temp Source 01/25/22 0524 Oral     SpO2 01/25/22 0524 98 %     Weight 01/25/22 0524 122 lb (55.3 kg)     Height 01/25/22 0524 '5\' 5"'$  (1.651 m)     Head Circumference --      Peak  Flow --      Pain Score 01/25/22 0524 10     Pain Loc --      Pain Edu? --      Excl. in [place]? --     Most recent vital signs: Vitals:   01/25/22 0548 01/25/22 0600  BP:  140/80  Pulse:  70  Resp:  17  Temp:    SpO2: 100% 97%     CONSTITUTIONAL: Alert and oriented and responds appropriately to questions.  Thin, chronically ill-appearing HEAD: Normocephalic; 2.5 cm superficial laceration to the posterior scalp EYES: Conjunctivae clear, PERRL, EOMI ENT: normal nose; no rhinorrhea; moist mucous membranes; pharynx without lesions noted; no dental injury; no septal hematoma, no epistaxis; no facial deformity or bony tenderness NECK: Supple, no midline spinal tenderness, step-off or deformity; trachea midline CARD: RRR; S1 and S2 appreciated; no murmurs, no clicks, no rubs, no gallops RESP: Normal chest excursion without splinting or tachypnea; breath sounds clear and equal bilaterally; no wheezes, no rhonchi, no rales; no hypoxia or respiratory distress, slightly diminished aeration bilaterally CHEST:  chest wall stable, no crepitus or ecchymosis or deformity, nontender to palpation; no flail chest ABD/GI: Normal bowel sounds; non-distended; soft, non-tender, no rebound, no guarding; no ecchymosis or other lesions noted PELVIS:  stable, nontender to palpation BACK:  The back appears normal; patient has midline lower thoracic and upper lumbar spinal tenderness without step-off or deformity EXT: Normal ROM in all joints; non-tender to palpation; no edema; normal capillary refill; no cyanosis, no bony tenderness or bony deformity of patient's extremities, no joint effusion, compartments are soft, extremities are warm and well-perfused, no ecchymosis, no calf tenderness or calf swelling SKIN: Normal color for age and race; warm NEURO: No facial asymmetry, normal speech, moving all extremities equally, normal sensation  ED Results / Procedures / Treatments   LABS: (all labs ordered are  listed, but only abnormal results are displayed) Labs Reviewed  BASIC METABOLIC PANEL - Abnormal; Notable for the following components:      Result Value   Potassium 3.2 (*)    Glucose, Bld 105 (*)    All other components within normal limits  CBC - Abnormal; Notable for the following components:   RDW 10.8 (*)    All other components within normal limits  D-DIMER, QUANTITATIVE  MAGNESIUM  TROPONIN I (HIGH SENSITIVITY)  TROPONIN I (HIGH SENSITIVITY)     EKG:  EKG Interpretation  Date/Time:  Monday January 25 2022 05:27:26 EDT Ventricular Rate:  68 PR Interval:  174 QRS Duration: 102 QT Interval:  412 QTC Calculation: 438 R Axis:   81 Text Interpretation: Normal sinus rhythm with sinus arrhythmia Incomplete right bundle branch block T wave abnormality, consider  anterior ischemia Abnormal ECG When compared with ECG of 17-Apr-2018 09:46, PR interval has increased Confirmed by Pryor Curia [phone]) on 01/25/2022 5:52:50 AM          RADIOLOGY: My personal review and interpretation of imaging: CT head shows no acute abnormality.  Chest x-ray clear.  Spinal x-rays showed no new fracture.  I have personally reviewed all radiology reports. DG Lumbar Spine Complete  Result Date: 01/25/2022 CLINICAL DATA:  61 year old female with history of trauma from a fall. Back pain. EXAM: LUMBAR SPINE - COMPLETE 4+ VIEW COMPARISON:  Lumbar spine radiograph 03/14/2021. FINDINGS: Again noted are chronic compression fractures of T12, L1 and superior endplate of L5. Compression is most severe at T12 where there is 25% loss of anterior vertebral body height (unchanged). No new acute displaced fractures or new compression type fractures are otherwise noted. Mild levoscoliosis of the lumbar spine convex to the left at the level of L3. Multilevel degenerative disc disease and facet arthropathy throughout the lumbar spine, most severe at L5-S1. Numerous vascular calcifications are noted. IMPRESSION: 1. No acute  radiographic abnormality of the lumbar spine. 2. Mild levoscoliosis of the lumbar spine with multilevel degenerative disc disease, lumbar spondylosis and multiple old compression fractures of the thoracolumbar spine, similar to the prior study, as detailed above. Electronically Signed   By: Vinnie Langton M.D.   On: 01/25/2022 06:51   DG Chest 2 View  Result Date: 01/25/2022 CLINICAL DATA:  61 year old female with history of shortness of breath. EXAM: CHEST - 2 VIEW COMPARISON:  Chest x-ray 10/28/2019. FINDINGS: Lung volumes are normal. No consolidative airspace disease. No pleural effusions. No pneumothorax. Mild bilateral apical pleuroparenchymal thickening and architectural distortion, most compatible with areas of chronic post infectious or inflammatory scarring, similar to the prior examination. No pulmonary nodule or mass noted. Pulmonary vasculature and the cardiomediastinal silhouette are within normal limits. IMPRESSION: No radiographic evidence of acute cardiopulmonary disease. Electronically Signed   By: Vinnie Langton M.D.   On: 01/25/2022 06:49   DG Thoracic Spine 2 View  Result Date: 01/25/2022 CLINICAL DATA:  61 year old female with history of trauma from a fall. Shortness of breath. EXAM: THORACIC SPINE 2 VIEWS COMPARISON:  Thoracic spine radiograph 03/14/2021. FINDINGS: Chronic compression fractures of T12 and L1, similar to the prior examination, most severe at T12 where there is 25% loss of anterior vertebral body height. Chronic compression fracture of T7 also noted, also with 25% loss of anterior vertebral body height, unchanged. No new acute displaced fractures or compression type fractures are otherwise noted. Acute kyphotic deformity at T12-L1, similar to the prior study. Alignment is otherwise anatomic. IMPRESSION: 1. No acute radiographic abnormality of the thoracic spine. 2. Chronic compression fractures of T7, T12 and L1, similar to the prior examination. Electronically Signed    By: Vinnie Langton M.D.   On: 01/25/2022 06:48   CT HEAD WO CONTRAST (5MM)  Result Date: 01/25/2022 CLINICAL DATA:  [place] and hit head earlier today. EXAM: CT HEAD WITHOUT CONTRAST TECHNIQUE: Contiguous axial images were obtained from the base of the skull through the vertex without intravenous contrast. RADIATION DOSE REDUCTION: This exam was performed according to the departmental dose-optimization program which includes automated exposure control, adjustment of the mA and/or kV according to patient size and/or use of iterative reconstruction technique. COMPARISON:  Head CT 10/03/2010. FINDINGS: Brain: No evidence of acute infarction, hemorrhage, hydrocephalus, extra-axial collection or mass lesion/mass effect. Vascular: No hyperdense vessel or unexpected calcification. Skull: There is mild left occipital  scalp swelling with overlying skin staples consistent with treated laceration. No skull fracture or focal lesion is seen. Sinuses/Orbits: There is mild membrane thickening in the maxillary sinuses. Other visible sinuses are clear. Right mastoid air cells are clear. Chronic patchy fluid is again noted in the lower left mastoid air cells. Both middle ear cavities are well aerated. The nasal septum deviates right. Other: None. IMPRESSION: 1. Left occipital scalp trauma. No acute intracranial CT findings or depressed skull fractures. 2. Chronic left mastoid effusion. Electronically Signed   By: Telford Nab M.D.   On: 01/25/2022 06:36     PROCEDURES:  Critical Care performed: No      .1-3 Lead EKG Interpretation  Performed by: Zeferino Mounts, Delice Bison, DO Authorized by: Vere Diantonio, Delice Bison, DO     Interpretation: normal     ECG rate:  73   ECG rate assessment: normal     Rhythm: sinus rhythm     Ectopy: none     Conduction: normal     LACERATION REPAIR Performed by: Pryor Curia Authorized by: Pryor Curia Consent: Verbal consent obtained. Risks and benefits: risks, benefits and alternatives  were discussed Consent given by: patient Patient identity confirmed: provided demographic data Prepped and Draped in normal sterile fashion Wound explored  Laceration Location: Posterior scalp  Laceration Length: 2.5 cm  No Foreign Bodies seen or palpated  Anesthesia: local infiltration  Local anesthetic: lidocaine 1% without epinephrine  Anesthetic total: 4 ml  Irrigation method: syringe Amount of cleaning: standard  Skin closure: Superficial  Number of sutures: 3 staples  Technique: Area anesthetized using lidocaine 1% without epinephrine. Wound irrigated copiously with sterile saline. Wound then cleaned with Betadine and draped in sterile fashion. Wound closed using 3 staples. Good wound approximation and hemostasis achieved.    Patient tolerance: Patient tolerated the procedure well with no immediate complications.     IMPRESSION / MDM / ASSESSMENT AND PLAN / ED COURSE  I reviewed the triage vital signs and the nursing notes.  Patient here with shortness of breath, syncope and head injury.  The patient is on the cardiac monitor to evaluate for evidence of arrhythmia and/or significant heart rate changes.   DIFFERENTIAL DIAGNOSIS (includes but not limited to):   ACS, PE, dissection, COPD, pneumonia, pneumothorax, cardiac arrhythmia, anemia, electrolyte derangement, concussion, skull fracture, intracranial hemorrhage, contusion, spinal fracture or dislocation, scalp laceration  Patient's presentation is most consistent with acute presentation with potential threat to life or bodily function.  PLAN: We will obtain CBC, BMP, troponin x2, D-dimer, chest x-ray, head CT, x-rays of thoracic and lumbar spine.  We will check orthostatic vital signs.  Will give pain and nausea medicine, IV fluids, breathing treatment for symptomatic relief.  We will update her tetanus vaccine and repair her scalp wound.   MEDICATIONS GIVEN IN ED: Medications  potassium chloride SA (KLOR-CON  M) CR tablet 40 mEq (has no administration in time range)  ipratropium-albuterol (DUONEB) 0.5-2.5 (3) MG/3ML nebulizer solution 3 mL (3 mLs Nebulization Given 01/25/22 0558)  Tdap (BOOSTRIX) injection 0.5 mL (0.5 mLs Intramuscular Given 01/25/22 0600)  ondansetron (ZOFRAN) injection 4 mg (4 mg Intravenous Given 01/25/22 0556)  fentaNYL (SUBLIMAZE) injection 50 mcg (50 mcg Intravenous Given 01/25/22 0556)  lidocaine (PF) (XYLOCAINE) 1 % injection 5 mL (5 mLs Intradermal Given 01/25/22 0616)     ED COURSE: Patient's labs show no leukocytosis, normal hemoglobin.  Potassium slightly low at 3.2.  Will give oral replacement and check a magnesium level.  First  troponin negative.  D-dimer negative.  No evidence seen on cardiac monitoring.  CT head, chest x-ray, thoracic and lumbar spine x-rays reviewed and interpreted by myself and radiology.  She has no skull fracture or intracranial hemorrhage.  No infiltrate, edema, pneumothorax, widened mediastinum, cardiomegaly on chest x-ray.  X-rays of the spine show chronic compression fractures of the thoracic and lumbar vertebra but no acute abnormality.  Pain is improved after pain medication and with local anesthetic to the scalp during wound repair.  She feels like her shortness of breath may have improved slightly with a breathing treatment but no significant change with breath sounds.  Will discuss with hospitalist for further monitoring and work-up for syncope.   CONSULTS:  Consulted and discussed patient's case with hospitalist, Dr. Blaine Hamper.  I have recommended admission and consulting physician agrees and will place admission orders.  Patient (and family if present) agree with this plan.   I reviewed all nursing notes, vitals, pertinent previous records.  All labs, EKGs, imaging ordered have been independently reviewed and interpreted by myself.    OUTSIDE RECORDS REVIEWED: Reviewed patient's last pulmonology note at [facility] on 04/21/2016.       FINAL CLINICAL  IMPRESSION(S) / ED DIAGNOSES   Final diagnoses:  Syncope and collapse  Injury of head, initial encounter  Shortness of breath  Abnormal EKG  Laceration of scalp, initial encounter     Rx / DC Orders   ED Discharge Orders     None        Note:  This document was prepared using Dragon voice recognition software and may include unintentional dictation errors.   Talmadge Ganas, Delice Bison, DO 01/25/22 [phone]

## 2022-01-25 NOTE — ED Notes (Signed)
Pt states that she had SHOB yesterday more than usual. States that she felt mild SHOB this morning after waking,  Was able to go to bathroom, no dizziness, then went to kitchen to get inhaler and passed out.  Does not remember feeling dizzy prior. States that she hit the back of her head on a cabinet.  Pt has laceration to mid back of head, bleeding has stopped at this time but hair is matted with blood.

## 2022-01-25 NOTE — Progress Notes (Signed)
       CROSS COVER NOTE  NAME: Jill King MRN: [id number] DOB : [birth year]    Date of Service   01/25/22  HPI/Events of Note   Patient medication request received for sleep aid.  Interventions   Plan: Melatonin      This document was prepared using Dragon voice recognition software and may include unintentional dictation errors.  Neomia Glass DNP, MHA, FNP-BC Nurse Practitioner [facility] Pager [phone]

## 2022-01-25 NOTE — Assessment & Plan Note (Addendum)
No signs of withdrawal.  Counseled patient on moderate use and risks of excessive alcohol consumption.

## 2022-01-25 NOTE — H&P (Signed)
History and Physical    Jill King WJX:[id number] DOB: 02-Apr-1961 DOA: 01/25/2022  Referring MD/NP/PA:   PCP: Rutherford Limerick, PA   Patient coming from:  The patient is coming from home.    Chief Complaint: syncope  HPI: Jill King is a 61 y.o. female with medical history significant of COPD, hypertension, hyperlipidemia, GERD, eosinophilic esophagitis, former smoker, alcohol use, chronic low back pain, who presents with syncope.  Pt states that she got up from bed, went to the bathroom and was walking into the kitchen, then suddenly passed out in kitchen at about 4:00 AM last night. Per her fianc at the bedside, patient passed out for less than 1 minutes.  No seizure activity.  Did not have prodromal symptoms.  Denies dizziness, unilateral numbness or tingling in extremities, no facial droop or slurred speech. Pt has occipital scalp laceration which is sutured up by ED physician.   Patient states that she has mild shortness of breath since yesterday, but denies chest pain, cough, fever or chills.  No nausea, vomiting, diarrhea or abdominal pain.  No symptoms of UTI.  Patient states that that she drinks beer, ~3 times per week, ~6 beers each time.  Patient states that she has chronic back pain due to degenerative disc disease.  Her back pain is mild today.   Data reviewed independently and ED Course: pt was found to have negative D-dimer 0.36, WBC 5.0, troponin level 3, potassium 3.2, magnesium 2.0, GFR> 60, temperature normal, blood pressure 140/80, heart rate 70, RR 17, oxygen saturation 98% on room air.  Chest x-ray negative.  CT head is negative for acute intracranial abnormalities.  X-ray of lumbar spine and T-spine negative for acute fracture, but showed degenerative disc and chronic compression fracture.  Patient is placed on telemetry bed for position.  EKG: I have personally reviewed.  Sinus rhythm, QTc 438, T wave inversion in V1-V3   Review of Systems:   General: no fevers,  chills, no body weight gain, fatigue HEENT: no blurry vision, hearing changes or sore throat Respiratory: has dyspnea, no coughing, wheezing CV: no chest pain, no palpitations GI: no nausea, vomiting, abdominal pain, diarrhea, constipation GU: no dysuria, burning on urination, increased urinary frequency, hematuria  Ext: no leg edema Neuro: no unilateral weakness, numbness, or tingling, no vision change or hearing loss. Has syncope Skin: no rash. Has occipital scalp laceration MSK: No muscle spasm, no deformity, no limitation of range of movement in spin Heme: No easy bruising.  Travel history: No recent long distant travel.   Allergy: No Known Allergies  Past Medical History:  Diagnosis Date   Alcohol use    Arthritis    hands, wrist   Asthma    COPD (chronic obstructive pulmonary disease) (HCC)    Emphysema of lung (HCC)    GERD (gastroesophageal reflux disease)    Eosinophilic esophagitis 2130   Osteoporosis    Wears dentures    full upper and lower    Past Surgical History:  Procedure Laterality Date   CESAREAN SECTION     COLONOSCOPY WITH PROPOFOL N/A 01/08/2016   Procedure: COLONOSCOPY WITH PROPOFOL;  Surgeon: Lucilla Lame, MD;  Location: [facility];  Service: Endoscopy;  Laterality: N/A;   ESOPHAGOGASTRODUODENOSCOPY (EGD) WITH PROPOFOL N/A 01/08/2016   Procedure: ESOPHAGOGASTRODUODENOSCOPY (EGD) WITH PROPOFOL;  Surgeon: Lucilla Lame, MD;  Location: [facility];  Service: Endoscopy;  Laterality: N/A;   MULTIPLE TOOTH EXTRACTIONS     POLYPECTOMY  01/08/2016   Procedure: POLYPECTOMY;  Surgeon: Lucilla Lame, MD;  Location: [facility];  Service: Endoscopy;;    Social History:  reports that she quit smoking about 7 years ago. Her smoking use included cigarettes. She has a 30.00 pack-year smoking history. She has never used smokeless tobacco. She reports current alcohol use. She reports that she does not use drugs.  Family History:  Family History   Problem Relation Age of Onset   COPD Mother    Diabetes Father    Heart disease Father    CAD Brother      Prior to Admission medications   Medication Sig Start Date End Date Taking? Authorizing Provider  albuterol (PROVENTIL HFA) 108 (90 Base) MCG/ACT inhaler Inhale 2 puffs into the lungs once every 4 to 6 hours as needed. 09/28/21     alendronate (FOSAMAX) 70 MG tablet TAKE 1 TABLET BY MOUTH ONCE A WEEK ON SUNDAYS. 10/06/21     amoxicillin-clavulanate (AUGMENTIN) 875-125 MG tablet Take 1 tablet by mouth every 12 (twelve) hours. 11/29/21   Melynda Ripple, MD  bisoprolol-hydrochlorothiazide (ZIAC) 5-6.25 MG tablet TAKE 1 TABLET BY MOUTH ONCE DAILY. 10/07/21     esomeprazole (NEXIUM) 40 MG capsule Take 40 mg by mouth daily at 12 noon.    [provider]  fenofibrate (TRICOR) 48 MG tablet TAKE 1 TABLET BY MOUTH ONCE A DAY 10/08/21     fluticasone (FLONASE) 50 MCG/ACT nasal spray Place 2 sprays into both nostrils daily. 11/29/21   Melynda Ripple, MD  gabapentin (NEURONTIN) 100 MG capsule Take 1 capsule (100 mg total) by mouth 3 (three) times daily as needed for pain. 03/26/21     linaclotide (LINZESS) 145 MCG CAPS capsule TAKE 1 CAPSULE BY MOUTH ONCE DAILY FOR CONSTIPATION. 12/23/21   Lucilla Lame, MD  promethazine-dextromethorphan (PROMETHAZINE-DM) 6.25-15 MG/5ML syrup Take 5 mLs by mouth 4 (four) times daily as needed for cough. 11/29/21   Melynda Ripple, MD  Spacer/Aero-Holding Chambers (AEROCHAMBER MV) inhaler Use as instructed 11/29/21   Melynda Ripple, MD    Physical Exam: Vitals:   01/25/22 0548 01/25/22 0600 01/25/22 0828 01/25/22 0927  BP:  140/80 (!) 141/74 136/74  Pulse:  70 86 85  Resp:  17  17  Temp:    98.4 F (36.9 C)  TempSrc:    Oral  SpO2: 100% 97%  98%  Weight:      Height:       General: Not in acute distress HEENT:       Eyes: PERRL, EOMI, no scleral icterus.       ENT: No discharge from the ears and nose, no pharynx injection, no tonsillar enlargement.         Neck: No JVD, no bruit, no mass felt. Heme: No neck lymph node enlargement. Cardiac: S1/S2, RRR, No murmurs, No gallops or rubs. Respiratory: No rales, wheezing, rhonchi or rubs. GI: Soft, nondistended, nontender, no rebound pain, no organomegaly, BS present. GU: No hematuria Ext: No pitting leg edema bilaterally. 1+DP/PT pulse bilaterally. Musculoskeletal: No joint deformities, No joint redness or warmth, no limitation of ROM in spin. Skin: No rashes. Has occipital scalp laceration which is sutured up, no active bleeding Neuro: Alert, oriented X3, cranial nerves II-XII grossly intact, moves all extremities normally. Muscle strength 5/5 in all extremities, sensation to light touch intact.  Psych: Patient is not psychotic, no suicidal or hemocidal ideation.  Labs on Admission: I have personally reviewed following labs and imaging studies  CBC: Recent Labs  Lab 01/25/22 0546  WBC 5.0  HGB 13.2  HCT 37.8  MCV 89.2  PLT 161   Basic Metabolic Panel: Recent Labs  Lab 01/25/22 0546  NA 139  K 3.2*  CL 108  CO2 22  GLUCOSE 105*  BUN 7  CREATININE 0.64  CALCIUM 9.4  MG 2.0   GFR: Estimated Creatinine Clearance: 66.9 mL/min (by C-G formula based on SCr of 0.64 mg/dL). Liver Function Tests: No results for input(s): "AST", "ALT", "ALKPHOS", "BILITOT", "PROT", "ALBUMIN" in the last 168 hours. No results for input(s): "LIPASE", "AMYLASE" in the last 168 hours. No results for input(s): "AMMONIA" in the last 168 hours. Coagulation Profile: No results for input(s): "INR", "PROTIME" in the last 168 hours. Cardiac Enzymes: No results for input(s): "CKTOTAL", "CKMB", "CKMBINDEX", "TROPONINI" in the last 168 hours. BNP (last 3 results) No results for input(s): "PROBNP" in the last 8760 hours. HbA1C: No results for input(s): "HGBA1C" in the last 72 hours. CBG: No results for input(s): "GLUCAP" in the last 168 hours. Lipid Profile: No results for input(s): "CHOL", "HDL",  "LDLCALC", "TRIG", "CHOLHDL", "LDLDIRECT" in the last 72 hours. Thyroid Function Tests: No results for input(s): "TSH", "T4TOTAL", "FREET4", "T3FREE", "THYROIDAB" in the last 72 hours. Anemia Panel: No results for input(s): "VITAMINB12", "FOLATE", "FERRITIN", "TIBC", "IRON", "RETICCTPCT" in the last 72 hours. Urine analysis:    Component Value Date/Time   COLORURINE YELLOW (A) 03/17/2016 0916   APPEARANCEUR CLEAR (A) 03/17/2016 0916   APPEARANCEUR Hazy 03/14/2014 0930   LABSPEC 1.016 03/17/2016 0916   LABSPEC 1.013 03/14/2014 0930   PHURINE 5.0 03/17/2016 0916   GLUCOSEU NEGATIVE 03/17/2016 0916   GLUCOSEU Negative 03/14/2014 0930   HGBUR NEGATIVE 03/17/2016 0916   BILIRUBINUR NEGATIVE 03/17/2016 0916   BILIRUBINUR Negative 03/14/2014 0930   KETONESUR NEGATIVE 03/17/2016 0916   PROTEINUR NEGATIVE 03/17/2016 0916   NITRITE NEGATIVE 03/17/2016 0916   LEUKOCYTESUR NEGATIVE 03/17/2016 0916   LEUKOCYTESUR 1+ 03/14/2014 0930   Sepsis Labs: '@LABRCNTIP'$ (procalcitonin:4,lacticidven:4) )No results found for this or any previous visit (from the past 240 hour(s)).   Radiological Exams on Admission: DG Lumbar Spine Complete  Result Date: 01/25/2022 CLINICAL DATA:  61 year old female with history of trauma from a fall. Back pain. EXAM: LUMBAR SPINE - COMPLETE 4+ VIEW COMPARISON:  Lumbar spine radiograph 03/14/2021. FINDINGS: Again noted are chronic compression fractures of T12, L1 and superior endplate of L5. Compression is most severe at T12 where there is 25% loss of anterior vertebral body height (unchanged). No new acute displaced fractures or new compression type fractures are otherwise noted. Mild levoscoliosis of the lumbar spine convex to the left at the level of L3. Multilevel degenerative disc disease and facet arthropathy throughout the lumbar spine, most severe at L5-S1. Numerous vascular calcifications are noted. IMPRESSION: 1. No acute radiographic abnormality of the lumbar spine. 2.  Mild levoscoliosis of the lumbar spine with multilevel degenerative disc disease, lumbar spondylosis and multiple old compression fractures of the thoracolumbar spine, similar to the prior study, as detailed above. Electronically Signed   By: Vinnie Langton M.D.   On: 01/25/2022 06:51   DG Chest 2 View  Result Date: 01/25/2022 CLINICAL DATA:  61 year old female with history of shortness of breath. EXAM: CHEST - 2 VIEW COMPARISON:  Chest x-ray 10/28/2019. FINDINGS: Lung volumes are normal. No consolidative airspace disease. No pleural effusions. No pneumothorax. Mild bilateral apical pleuroparenchymal thickening and architectural distortion, most compatible with areas of chronic post infectious or inflammatory scarring, similar to the prior examination. No pulmonary nodule or mass noted. Pulmonary vasculature  and the cardiomediastinal silhouette are within normal limits. IMPRESSION: No radiographic evidence of acute cardiopulmonary disease. Electronically Signed   By: Vinnie Langton M.D.   On: 01/25/2022 06:49   DG Thoracic Spine 2 View  Result Date: 01/25/2022 CLINICAL DATA:  61 year old female with history of trauma from a fall. Shortness of breath. EXAM: THORACIC SPINE 2 VIEWS COMPARISON:  Thoracic spine radiograph 03/14/2021. FINDINGS: Chronic compression fractures of T12 and L1, similar to the prior examination, most severe at T12 where there is 25% loss of anterior vertebral body height. Chronic compression fracture of T7 also noted, also with 25% loss of anterior vertebral body height, unchanged. No new acute displaced fractures or compression type fractures are otherwise noted. Acute kyphotic deformity at T12-L1, similar to the prior study. Alignment is otherwise anatomic. IMPRESSION: 1. No acute radiographic abnormality of the thoracic spine. 2. Chronic compression fractures of T7, T12 and L1, similar to the prior examination. Electronically Signed   By: Vinnie Langton M.D.   On: 01/25/2022  06:48   CT HEAD WO CONTRAST (5MM)  Result Date: 01/25/2022 CLINICAL DATA:  [place] [address] and hit head earlier today. EXAM: CT HEAD WITHOUT CONTRAST TECHNIQUE: Contiguous axial images were obtained from the base of the skull through the vertex without intravenous contrast. RADIATION DOSE REDUCTION: This exam was performed according to the departmental dose-optimization program which includes automated exposure control, adjustment of the mA and/or kV according to patient size and/or use of iterative reconstruction technique. COMPARISON:  Head CT 10/03/2010. FINDINGS: Brain: No evidence of acute infarction, hemorrhage, hydrocephalus, extra-axial collection or mass lesion/mass effect. Vascular: No hyperdense vessel or unexpected calcification. Skull: There is mild left occipital scalp swelling with overlying skin staples consistent with treated laceration. No skull fracture or focal lesion is seen. Sinuses/Orbits: There is mild membrane thickening in the maxillary sinuses. Other visible sinuses are clear. Right mastoid air cells are clear. Chronic patchy fluid is again noted in the lower left mastoid air cells. Both middle ear cavities are well aerated. The nasal septum deviates right. Other: None. IMPRESSION: 1. Left occipital scalp trauma. No acute intracranial CT findings or depressed skull fractures. 2. Chronic left mastoid effusion. Electronically Signed   By: Telford Nab M.D.   On: 01/25/2022 06:36      Assessment/Plan Principal Problem:   Syncope Active Problems:   HTN (hypertension)   COPD (chronic obstructive pulmonary disease) (HCC)   Hypokalemia   HLD (hyperlipidemia)   Eosinophilic esophagitis   GERD (gastroesophageal reflux disease)   Chronic back pain   Laceration of occipital scalp   Alcohol use   Assessment and Plan: * Syncope Syncope: Etiology is not clear. The differential diagnosis is broad, including vasovagal syncope, TIA, arrhythmia, alcohol intoxication, drug abuse, orthostatic  status. - Place on tele for obs - Orthostatic vital signs  - MRI-brain - 2d echo - Neuro checks  - IVF: 1L NS bolus and NS 100 cc/h - check UDS and alcohol level   HTN (hypertension) -IV hydralazine as needed -ZiAC  COPD (chronic obstructive pulmonary disease) (HCC) No wheezing or rhonchi on auscultation.  No acute exacerbation -Bronchodilators  Hypokalemia Potassium 3.2, magnesium 2.0 -Repleted potassium  HLD (hyperlipidemia) - Tricor  Eosinophilic esophagitis - Protonix  GERD (gastroesophageal reflux disease) - Protonix  Chronic back pain Patient has chronic back pain due to chronic degenerative disc disease and chronic compression fracture.  Patient states that her pain is mild today -As needed Tylenol -Lidoderm  Laceration of occipital scalp Laceration is sutured up  by ED physician.  No active bleeding. - Wound care consult  Alcohol use - Due to counseling about the importance of cutting down drinking -CIWA protocol          DVT ppx: SQ Lovenox  Code Status: Full code  Family Communication:  Yes, patient's fianc at bed side.    Disposition Plan:  Anticipate discharge back to previous environment  Consults called:  none  Admission status and Level of care: Telemetry Medical:   for obs     Severity of Illness:  The appropriate patient status for this patient is OBSERVATION. Observation status is judged to be reasonable and necessary in order to provide the required intensity of service to ensure the patient's safety. The patient's presenting symptoms, physical exam findings, and initial radiographic and laboratory data in the context of their medical condition is felt to place them at decreased risk for further clinical deterioration. Furthermore, it is anticipated that the patient will be medically stable for discharge from the hospital within 2 midnights of admission.        Date of Service 01/25/2022    [facility]   If 7PM-7AM, please contact night-coverage [email or web address] 01/25/2022, 10:46 AM

## 2022-01-25 NOTE — Assessment & Plan Note (Addendum)
Resolved with replacement for K 3.2 on admission. Repeat BMP in 1-2 weeks

## 2022-01-25 NOTE — Assessment & Plan Note (Addendum)
-  IV hydralazine as needed [facility]

## 2022-01-25 NOTE — Assessment & Plan Note (Addendum)
Stable. No wheezing or rhonchi on auscultation.  -Bronchodilators

## 2022-01-25 NOTE — Consult Note (Signed)
WOC Nurse Consult Note: Reason for Consult: Posterior scalp laceration stapled for close approximation in ED today. No bleeding. See EDP note.  ED RN notes dried blood in hair. Wound type:Trauma Pressure Injury POA: N/A Measurement:2.5cm laceration with 3 staples Wound bed:N/A Drainage (amount, consistency, odor) one. Bleeding has stopped Periwound:with dried blood in hair Dressing procedure/placement/frequency: Guidance is provided for Nursing to begin care tomorrow after allowing time for homeostasis. Dried blood may be removed using hydrogen peroxide solution and hydrogen peroxide moistened gauze to remove debris from periwound area and hair. This can be performed twice daily until periwound is free of dried blood. Patient to see PCP in 7-10 days or as directed for staple removal.  [facility] nursing team will not follow, but will remain available to this patient, the nursing and medical teams.  Please re-consult if needed.  Thank you for inviting [place] to participate in this patient's Plan of Care.  Maudie Flakes, MSN, RN, CNS, [place], Serita Grammes, [employer], [employer] phone:  [phone]

## 2022-01-25 NOTE — Assessment & Plan Note (Addendum)
Syncope: Etiology is not clear. The differential diagnosis is broad, including vasovagal syncope, TIA, arrhythmia, alcohol intoxication, drug abuse, orthostatic status. - Monitored on telemetry without any dysrhythmias noted - Orthostatic vitals - normal - MRI-brain - negative - 2d echo - normal EF, no WMA's - Neuro checks - stable, unremarkable - Hydrated on admission with 1L NS bolus and NS 100 cc/h - Cardiology to mail ZIo patch ambulatory heart monitor to patient and will follow up with her on the results.   Given lack of prodromal symptoms and negative evaluation, need further evaluation to rule out cardiac arrhythmias.

## 2022-01-26 ENCOUNTER — Telehealth: Payer: Self-pay

## 2022-01-26 LAB — BASIC METABOLIC PANEL
Anion gap: 5 (ref 5–15)
BUN: 7 mg/dL (ref 6–20)
CO2: 24 mmol/L (ref 22–32)
Calcium: 8.6 mg/dL — ABNORMAL LOW (ref 8.9–10.3)
Chloride: 111 mmol/L (ref 98–111)
Creatinine, Ser: 0.47 mg/dL (ref 0.44–1.00)
GFR, Estimated: >60 mL/min (ref >60)
Glucose, Bld: 102 mg/dL — ABNORMAL HIGH (ref 70–99)
Potassium: 3.5 mmol/L (ref 3.5–5.1)
Sodium: 140 mmol/L (ref 135–145)

## 2022-01-26 LAB — URINE DRUG SCREEN, QUALITATIVE (ARMC ONLY)
Amphetamines, Ur Screen: NOT DETECTED
Barbiturates, Ur Screen: NOT DETECTED
Benzodiazepine, Ur Scrn: POSITIVE — AB
Cannabinoid 50 Ng, Ur ~~LOC~~: NOT DETECTED
Cocaine Metabolite,Ur ~~LOC~~: NOT DETECTED
MDMA (Ecstasy)Ur Screen: POSITIVE — AB
Methadone Scn, Ur: NOT DETECTED
Opiate, Ur Screen: NOT DETECTED
Phencyclidine (PCP) Ur S: NOT DETECTED
Tricyclic, Ur Screen: NOT DETECTED

## 2022-01-26 MED ORDER — ALBUTEROL SULFATE HFA 108 (90 BASE) MCG/ACT IN AERS: INHALATION_SPRAY | RESPIRATORY_TRACT | 2 refills | Status: DC

## 2022-01-26 NOTE — Telephone Encounter (Signed)
Spoke with Jill King at [facility] to add to dc papers for patient .

## 2022-01-26 NOTE — Telephone Encounter (Signed)
-----   Message from Gerrie Nordmann, NP sent at 01/26/2022 12:06 PM EDT ----- Regarding: hospital follow up and Zio Patient will need a Zio AT monitor for 14 days mailed to her home for follow up on syncope. She will also need a hospital follow up appointment as a new patient in 4-5 weeks please and thank you

## 2022-01-26 NOTE — Progress Notes (Signed)
Mobility Specialist - Progress Note    01/26/22 1350  Mobility  Activity Ambulated independently in hallway;Stood at bedside  Level of Assistance Independent  Distance Ambulated (ft) 160 ft  Activity Response Tolerated well  $Mobility charge 1 Mobility   Pt EOB on RA upon arrival. Pt STS and ambulates 1 lap around NS indep. Pt returns to bed with needs in reach.   Gretchen Short  Mobility Specialist  01/26/22 1:51 PM

## 2022-01-26 NOTE — Discharge Instructions (Signed)
Please go to your Primary Care Provider in 7-10 days to have staples removed.

## 2022-01-26 NOTE — Telephone Encounter (Signed)
Scheduled fu 9/14  1040 am with [facility] .

## 2022-01-26 NOTE — Progress Notes (Signed)
Patient being discharged home. IV removed. Went over discharge instructions with patient. Patient will be going home POV with daughter.

## 2022-01-26 NOTE — Evaluation (Signed)
Occupational Therapy Evaluation Patient Details Name: Jill King MRN: [id number] DOB: [birth year] Today's Date: 01/26/2022   History of Present Illness 61 y.o. female with medical history significant of COPD, hypertension, hyperlipidemia, GERD, eosinophilic esophagitis, former smoker, alcohol use, chronic low back pain, who presents with syncope.   Clinical Impression    Upon entering the room, pt supine in bed with daughter present in room. Pt is very pleasant and agreeable to OT evaluation. Pt is fully dressed and reports dressing self without issue. Pt sits on EOB and dons B shoes and ties independently. Pt stands and ambulates 100' to stairwell without LOB or AD utilized. Pt ambulates up/down 1 flight of stairs with use of R handrail and no LOB but decreased speed. Pt returns back to room without issue. She feels that she has returned to baseline. Orthostatics performed prior to this session by nursing and appeared to be WNLs. Pt with no further concerns at this time. OT to complete orders as pt does not need skilled OT intervention at this time.    Recommendations for follow up therapy are one component of a multi-disciplinary discharge planning process, led by the attending physician.  Recommendations may be updated based on patient status, additional functional criteria and insurance authorization.   Follow Up Recommendations  No OT follow up    Assistance Recommended at Discharge None     Functional Status Assessment  Patient has not had a recent decline in their functional status  Equipment Recommendations  None recommended by OT       Precautions / Restrictions Precautions Precautions: Fall      Mobility Bed Mobility Overal bed mobility: Independent                  Transfers Overall transfer level: Independent Equipment used: None                      Balance Overall balance assessment: Independent                                          ADL either performed or assessed with clinical judgement   ADL Overall ADL's : Independent                                       General ADL Comments: Pt is fully dressed and dons B shoes and ties without assistance. Functional mobility and transfers without use of AD at Ind level.     Vision Patient Visual Report: No change from baseline              Pertinent Vitals/Pain Pain Assessment Pain Assessment: No/denies pain     Hand Dominance Right   Extremity/Trunk Assessment Upper Extremity Assessment Upper Extremity Assessment: Overall WFL for tasks assessed   Lower Extremity Assessment Lower Extremity Assessment: Overall WFL for tasks assessed   Cervical / Trunk Assessment Cervical / Trunk Assessment: Normal   Communication Communication Communication: No difficulties   Cognition Arousal/Alertness: Awake/alert Behavior During Therapy: WFL for tasks assessed/performed Overall Cognitive Status: Within Functional Limits for tasks assessed                                 General Comments: Pt is very pleasant  and cooperative                Home Living Family/patient expects to be discharged to:: Private residence Living Arrangements: Spouse/significant other Available Help at Discharge: Family;Available 24 hours/day Type of Home: House Home Access: Stairs to enter [employer] of Steps: 5 Entrance Stairs-Rails: Right;Left;Can reach both Home Layout: One level     Bathroom Shower/Tub: Chief Strategy Officer: Shower seat - built in          Prior Functioning/Environment Prior Level of Function : Independent/Modified Independent                                 OT Goals(Current goals can be found in the care plan section) Acute Rehab OT Goals Patient Stated Goal: to return home OT Goal Formulation: With patient/family Time For Goal Achievement: 01/26/22 Potential to Achieve  Goals: Good  OT Frequency:         AM-PAC OT "6 Clicks" Daily Activity     Outcome Measure Help from another person eating meals?: None Help from another person taking care of personal grooming?: None Help from another person toileting, which includes using toliet, bedpan, or urinal?: None Help from another person bathing (including washing, rinsing, drying)?: None Help from another person to put on and taking off regular upper body clothing?: None Help from another person to put on and taking off regular lower body clothing?: None 6 Click Score: 24   End of Session Nurse Communication: Mobility status;Other (comment) (IV)  Activity Tolerance: Patient tolerated treatment well Patient left: in bed;with family/visitor present                   Time: 1030-1040 OT Time Calculation (min): 10 min Charges:  OT General Charges $OT Visit: 1 Visit OT Evaluation $OT Eval Low Complexity: 1 Low Darleen Crocker, MS, OTR/L , CBIS ascom [phone]  01/26/22, 10:47 AM

## 2022-01-26 NOTE — Progress Notes (Signed)
Mobility Specialist - Progress Note    01/26/22 0939  Mobility  Activity Ambulated independently in hallway;Stood at bedside;Dangled on edge of bed  Level of Assistance Independent  Assistive Device None  Distance Ambulated (ft) 340 ft  Activity Response Tolerated well  $Mobility charge 1 Mobility   Pt supine in bed on RA upon arrival. PT able to put shoes on in figure four indep. PT STS and ambulates 2 laps around NS Indep. Pt left in bed with needs in reach.   Jill King  Mobility Specialist  01/26/22 9:41 AM

## 2022-01-26 NOTE — Discharge Summary (Signed)
Physician Discharge Summary   Patient: Jill King MRN: [id number] DOB: April 23, 1961  Admit date:     01/25/2022  Discharge date: 01/26/2022  Discharge Physician: Ezekiel Slocumb   PCP: Rutherford Limerick, PA   Recommendations at discharge:    Follow up with Primary Care in 1-2 weeks Follow up with Cardiology for results of Zio patch heart monitor Repeat CBC, BMP, Mg in 1-2 weeks Follow up for staple removal from scalp laceration as instructed by ED provider  Discharge Diagnoses: Principal Problem:   Syncope Active Problems:   HTN (hypertension)   COPD (chronic obstructive pulmonary disease) (HCC)   Hypokalemia   HLD (hyperlipidemia)   Eosinophilic esophagitis   GERD (gastroesophageal reflux disease)   Chronic back pain   Laceration of occipital scalp   Alcohol use  Resolved Problems:   * No resolved hospital problems. [facility] Course: HPI on Admission:  "Jill King is a 61 y.o. female with medical history significant of COPD, hypertension, hyperlipidemia, GERD, eosinophilic esophagitis, former smoker, alcohol use, chronic low back pain, who presents with syncope.   Pt states that she got up from bed, went to the bathroom and was walking into the kitchen, then suddenly passed out in kitchen at about 4:00 AM last night. Per her fianc at the bedside, patient passed out for less than 1 minutes.  No seizure activity.  Did not have prodromal symptoms.  Denies dizziness, unilateral numbness or tingling in extremities, no facial droop or slurred speech. Pt has occipital scalp laceration which is sutured up by ED physician.    Patient states that she has mild shortness of breath since yesterday, but denies chest pain, cough, fever or chills.  No nausea, vomiting, diarrhea or abdominal pain.  No symptoms of UTI.  Patient states that that she drinks beer, ~3 times per week, ~6 beers each time.  Patient states that she has chronic back pain due to degenerative disc disease.  Her back pain  is mild today.     Data reviewed independently and ED Course: pt was found to have negative D-dimer 0.36, WBC 5.0, troponin level 3, potassium 3.2, magnesium 2.0, GFR> 60, temperature normal, blood pressure 140/80, heart rate 70, RR 17, oxygen saturation 98% on room air.  Chest x-ray negative.  CT head is negative for acute intracranial abnormalities.  X-ray of lumbar spine and T-spine negative for acute fracture, but showed degenerative disc and chronic compression fracture.  Patient is placed on telemetry bed for position.   EKG: I have personally reviewed.  Sinus rhythm, QTc 438, T wave inversion in V1-V3 "    Assessment and Plan: * Syncope Syncope: Etiology is not clear. The differential diagnosis is broad, including vasovagal syncope, TIA, arrhythmia, alcohol intoxication, drug abuse, orthostatic status. - Monitored on telemetry without any dysrhythmias noted - Orthostatic vitals - normal - MRI-brain - negative - 2d echo - normal EF, no WMA's - Neuro checks - stable, unremarkable - Hydrated on admission with 1L NS bolus and NS 100 cc/h - Cardiology to mail ZIo patch ambulatory heart monitor to patient and will follow up with her on the results.   Given lack of prodromal symptoms and negative evaluation, need further evaluation to rule out cardiac arrhythmias.  HTN (hypertension) -IV hydralazine as needed -ZiAC  COPD (chronic obstructive pulmonary disease) (HCC) Stable. No wheezing or rhonchi on auscultation.  -Bronchodilators  Hypokalemia Resolved with replacement for K 3.2 on admission. Repeat BMP in 1-2 weeks  HLD (hyperlipidemia) - Tricor  Eosinophilic esophagitis Continue PPI  GERD (gastroesophageal reflux disease) Continue PPI  Chronic back pain Due to chronic degenerative disc disease and chronic compression fracture.  Stable -As needed Tylenol -Lidoderm ordered  Laceration of occipital scalp Laceration is sutured up by ED physician.  No active bleeding. -  Wound care consulted, appreciate recommendations - see WOC consult note  Alcohol use No signs of withdrawal.  Counseled patient on moderate use and risks of excessive alcohol consumption.         Consultants: none Procedures performed: echo  Disposition: Home Diet recommendation:  Discharge Diet Orders (From admission, onward)     Start     Ordered   01/26/22 0000  Diet - low sodium heart healthy        01/26/22 1340           Cardiac diet DISCHARGE MEDICATION: Allergies as of 01/26/2022   No Known Allergies      Medication List     STOP taking these medications    amoxicillin-clavulanate 875-125 MG tablet Commonly known as: AUGMENTIN   promethazine-dextromethorphan 6.25-15 MG/5ML syrup Commonly known as: PROMETHAZINE-DM       TAKE these medications    AeroChamber MV inhaler Use as instructed   albuterol 108 (90 Base) MCG/ACT inhaler Commonly known as: Proventil HFA Inhale 2 puffs into the lungs once every 4 to 6 hours as needed.   alendronate 70 MG tablet Commonly known as: Fosamax TAKE 1 TABLET BY MOUTH ONCE A WEEK ON SUNDAYS.   bisoprolol-hydrochlorothiazide 5-6.25 MG tablet Commonly known as: Ziac TAKE 1 TABLET BY MOUTH ONCE DAILY.   esomeprazole 40 MG capsule Commonly known as: NEXIUM Take 40 mg by mouth daily at 12 noon.   fenofibrate 48 MG tablet Commonly known as: Tricor TAKE 1 TABLET BY MOUTH ONCE A DAY   fluticasone 50 MCG/ACT nasal spray Commonly known as: FLONASE Place 2 sprays into both nostrils daily.   gabapentin 100 MG capsule Commonly known as: NEURONTIN Take 1 capsule (100 mg total) by mouth 3 (three) times daily as needed for pain.   Linzess 145 MCG Caps capsule Generic drug: linaclotide TAKE 1 CAPSULE BY MOUTH ONCE DAILY FOR CONSTIPATION.               Discharge Care Instructions  (From admission, onward)           Start     Ordered   01/26/22 0000  Discharge wound care:       Comments: You can  remove dried blood from stapled laceration with hydrogen peroxide using clean gauze.   01/26/22 1340            Discharge Exam: Filed Weights   01/25/22 0524  Weight: 55.3 kg   General exam: awake, alert, no acute distress HEENT: atraumatic, clear conjunctiva, anicteric sclera, moist mucus membranes, hearing grossly normal  Respiratory system: CTAB, no wheezes, rales or rhonchi, normal respiratory effort. Cardiovascular system: normal S1/S2, RRR, no JVD, murmurs, rubs, gallops, no pedal edema.   Gastrointestinal system: soft, NT, ND, no HSM felt, +bowel sounds. Central nervous system: A&O x4. no gross focal neurologic deficits, normal speech Extremities: moves all, no edema, normal tone Skin: dry, intact, normal temperature, normal color, No rashes, lesions or ulcers Psychiatry: normal mood, congruent affect, judgement and insight appear normal   Condition at discharge: stable  The results of significant diagnostics from this hospitalization (including imaging, microbiology, ancillary and laboratory) are listed below for reference.   Imaging Studies: ECHOCARDIOGRAM COMPLETE  Result Date: 01/25/2022    ECHOCARDIOGRAM REPORT   Patient Name:   Jill King Date of Exam: 01/25/2022 Medical Rec #:  [id number]   Height:       65.0 in Accession #:    [id number]  Weight:       122.0 lb Date of Birth:  Oct 24, 1960  BSA:          1.603 m Patient Age:    69 years    BP:           136/74 mmHg Patient Gender: F           HR:           75 bpm. Exam Location:  [facility] Procedure: 2D Echo, Color Doppler and Cardiac Doppler Indications:     R55 Syncope  History:         Patient has no prior history of Echocardiogram examinations.                  COPD; Risk Factors:Former Smoker, Dyslipidemia and                  Hypertension.  Sonographer:     Rosalia Hammers Referring Phys:  [id number] Ivor Costa Diagnosing Phys: Kathlyn Sacramento MD  Sonographer Comments: Image acquisition challenging due to patient body habitus and  Image acquisition challenging due to COPD. IMPRESSIONS  1. Left ventricular ejection fraction, by estimation, is 55 to 60%. The left ventricle has normal function. The left ventricle has no regional wall motion abnormalities. Left ventricular diastolic parameters were normal.  2. Right ventricular systolic function is normal. The right ventricular size is normal. Tricuspid regurgitation signal is inadequate for assessing PA pressure.  3. The mitral valve is normal in structure. No evidence of mitral valve regurgitation. No evidence of mitral stenosis.  4. The aortic valve is normal in structure. Aortic valve regurgitation is not visualized. No aortic stenosis is present.  5. The inferior vena cava is normal in size with greater than 50% respiratory variability, suggesting right atrial pressure of 3 mmHg. FINDINGS  Left Ventricle: Left ventricular ejection fraction, by estimation, is 55 to 60%. The left ventricle has normal function. The left ventricle has no regional wall motion abnormalities. The left ventricular internal cavity size was normal in size. There is  no left ventricular hypertrophy. Left ventricular diastolic parameters were normal. Right Ventricle: The right ventricular size is normal. No increase in right ventricular wall thickness. Right ventricular systolic function is normal. Tricuspid regurgitation signal is inadequate for assessing PA pressure. Left Atrium: Left atrial size was normal in size. Right Atrium: Right atrial size was normal in size. Pericardium: There is no evidence of pericardial effusion. Mitral Valve: The mitral valve is normal in structure. No evidence of mitral valve regurgitation. No evidence of mitral valve stenosis. Tricuspid Valve: The tricuspid valve is normal in structure. Tricuspid valve regurgitation is not demonstrated. No evidence of tricuspid stenosis. Aortic Valve: The aortic valve is normal in structure. Aortic valve regurgitation is not visualized. No aortic  stenosis is present. Aortic valve mean gradient measures 4.0 mmHg. Aortic valve peak gradient measures 8.5 mmHg. Aortic valve area, by VTI measures 2.29 cm. Pulmonic Valve: The pulmonic valve was normal in structure. Pulmonic valve regurgitation is not visualized. No evidence of pulmonic stenosis. Aorta: The aortic root is normal in size and structure. Venous: The inferior vena cava is normal in size with greater than 50% respiratory variability, suggesting right atrial pressure of 3 mmHg. IAS/Shunts:  No atrial level shunt detected by color flow Doppler.  LEFT VENTRICLE PLAX 2D LVIDd:         4.35 cm   Diastology LVIDs:         3.09 cm   LV e' medial:    9.36 cm/s LV PW:         1.20 cm   LV E/e' medial:  9.7 LV IVS:        0.90 cm   LV e' lateral:   13.30 cm/s LVOT diam:     1.90 cm   LV E/e' lateral: 6.8 LV SV:         74 LV SV Index:   46 LVOT Area:     2.84 cm  RIGHT VENTRICLE RV Basal diam:  2.49 cm RV S prime:     15.20 cm/s TAPSE (M-mode): 1.0 cm LEFT ATRIUM             Index        RIGHT ATRIUM           Index LA diam:        2.50 cm 1.56 cm/m   RA Area:     11.30 cm LA Vol (A2C):   45.4 ml 28.32 ml/m  RA Volume:   24.60 ml  15.34 ml/m LA Vol (A4C):   30.9 ml 19.27 ml/m LA Biplane Vol: 39.3 ml 24.51 ml/m  AORTIC VALVE AV Area (Vmax):    2.49 cm AV Area (Vmean):   2.40 cm AV Area (VTI):     2.29 cm AV Vmax:           146.00 cm/s AV Vmean:          97.600 cm/s AV VTI:            0.322 m AV Peak Grad:      8.5 mmHg AV Mean Grad:      4.0 mmHg LVOT Vmax:         128.00 cm/s LVOT Vmean:        82.600 cm/s LVOT VTI:          0.260 m LVOT/AV VTI ratio: 0.81  AORTA Ao Root diam: 3.10 cm MITRAL VALVE MV Area (PHT): 3.76 cm    SHUNTS MV Decel Time: 202 msec    Systemic VTI:  0.26 m MV E velocity: 90.40 cm/s  Systemic Diam: 1.90 cm MV A velocity: 84.40 cm/s MV E/A ratio:  1.07 Kathlyn Sacramento MD Electronically signed by Kathlyn Sacramento MD Signature Date/Time: 01/25/2022/5:51:01 PM    Final    MR BRAIN WO  CONTRAST  Result Date: 01/25/2022 CLINICAL DATA:  Syncope EXAM: MRI HEAD WITHOUT CONTRAST TECHNIQUE: Multiplanar, multiecho pulse sequences of the brain and surrounding structures were obtained without intravenous contrast. COMPARISON:  CT head obtained earlier the same day FINDINGS: Brain: There is no acute intracranial hemorrhage, extra-axial fluid collection, or acute infarct. Parenchymal volume is normal. The ventricles are normal in size. Gray-white differentiation is preserved. Scattered small foci of FLAIR signal abnormality in the supratentorial white matter are nonspecific but likely reflects sequela of mild chronic white matter microangiopathy. There is no mass lesion.  There is no mass effect or midline shift. Vascular: Normal flow voids. Skull and upper cervical spine: Normal marrow signal. Sinuses/Orbits: The paranasal sinuses are clear. Globes and orbits are unremarkable. Other: There are small bilateral mastoid effusions. The imaged nasopharynx is unremarkable. IMPRESSION: 1. No acute intracranial pathology. 2. Small bilateral mastoid effusions, nonspecific. Electronically Signed  By: Valetta Mole M.D.   On: 01/25/2022 11:32   DG Lumbar Spine Complete  Result Date: 01/25/2022 CLINICAL DATA:  61 year old female with history of trauma from a fall. Back pain. EXAM: LUMBAR SPINE - COMPLETE 4+ VIEW COMPARISON:  Lumbar spine radiograph 03/14/2021. FINDINGS: Again noted are chronic compression fractures of T12, L1 and superior endplate of L5. Compression is most severe at T12 where there is 25% loss of anterior vertebral body height (unchanged). No new acute displaced fractures or new compression type fractures are otherwise noted. Mild levoscoliosis of the lumbar spine convex to the left at the level of L3. Multilevel degenerative disc disease and facet arthropathy throughout the lumbar spine, most severe at L5-S1. Numerous vascular calcifications are noted. IMPRESSION: 1. No acute radiographic  abnormality of the lumbar spine. 2. Mild levoscoliosis of the lumbar spine with multilevel degenerative disc disease, lumbar spondylosis and multiple old compression fractures of the thoracolumbar spine, similar to the prior study, as detailed above. Electronically Signed   By: Vinnie Langton M.D.   On: 01/25/2022 06:51   DG Chest 2 View  Result Date: 01/25/2022 CLINICAL DATA:  61 year old female with history of shortness of breath. EXAM: CHEST - 2 VIEW COMPARISON:  Chest x-ray 10/28/2019. FINDINGS: Lung volumes are normal. No consolidative airspace disease. No pleural effusions. No pneumothorax. Mild bilateral apical pleuroparenchymal thickening and architectural distortion, most compatible with areas of chronic post infectious or inflammatory scarring, similar to the prior examination. No pulmonary nodule or mass noted. Pulmonary vasculature and the cardiomediastinal silhouette are within normal limits. IMPRESSION: No radiographic evidence of acute cardiopulmonary disease. Electronically Signed   By: Vinnie Langton M.D.   On: 01/25/2022 06:49   DG Thoracic Spine 2 View  Result Date: 01/25/2022 CLINICAL DATA:  61 year old female with history of trauma from a fall. Shortness of breath. EXAM: THORACIC SPINE 2 VIEWS COMPARISON:  Thoracic spine radiograph 03/14/2021. FINDINGS: Chronic compression fractures of T12 and L1, similar to the prior examination, most severe at T12 where there is 25% loss of anterior vertebral body height. Chronic compression fracture of T7 also noted, also with 25% loss of anterior vertebral body height, unchanged. No new acute displaced fractures or compression type fractures are otherwise noted. Acute kyphotic deformity at T12-L1, similar to the prior study. Alignment is otherwise anatomic. IMPRESSION: 1. No acute radiographic abnormality of the thoracic spine. 2. Chronic compression fractures of T7, T12 and L1, similar to the prior examination. Electronically Signed   By: Vinnie Langton M.D.   On: 01/25/2022 06:48   CT HEAD WO CONTRAST (5MM)  Result Date: 01/25/2022 CLINICAL DATA:  [place] and hit head earlier today. EXAM: CT HEAD WITHOUT CONTRAST TECHNIQUE: Contiguous axial images were obtained from the base of the skull through the vertex without intravenous contrast. RADIATION DOSE REDUCTION: This exam was performed according to the departmental dose-optimization program which includes automated exposure control, adjustment of the mA and/or kV according to patient size and/or use of iterative reconstruction technique. COMPARISON:  Head CT 10/03/2010. FINDINGS: Brain: No evidence of acute infarction, hemorrhage, hydrocephalus, extra-axial collection or mass lesion/mass effect. Vascular: No hyperdense vessel or unexpected calcification. Skull: There is mild left occipital scalp swelling with overlying skin staples consistent with treated laceration. No skull fracture or focal lesion is seen. Sinuses/Orbits: There is mild membrane thickening in the maxillary sinuses. Other visible sinuses are clear. Right mastoid air cells are clear. Chronic patchy fluid is again noted in the lower left mastoid air cells. Both middle  ear cavities are well aerated. The nasal septum deviates right. Other: None. IMPRESSION: 1. Left occipital scalp trauma. No acute intracranial CT findings or depressed skull fractures. 2. Chronic left mastoid effusion. Electronically Signed   By: Telford Nab M.D.   On: 01/25/2022 06:36    Microbiology: Results for orders placed or performed in visit on 07/11/19  Novel Coronavirus, NAA ([facility])     Status: None   Collection Time: 07/11/19 12:38 PM   Specimen: Nasopharyngeal(NP) swabs in vial transport medium   NASOPHARYNGE  TESTING  Result Value Ref Range Status   SARS-CoV-2, NAA Not Detected Not Detected Final    Comment: This nucleic acid amplification test was developed and its performance characteristics determined by [employer]. Nucleic  acid amplification tests include PCR and TMA. This test has not been FDA cleared or approved. This test has been authorized by FDA under an Emergency Use Authorization (EUA). This test is only authorized for the duration of time the declaration that circumstances exist justifying the authorization of the emergency use of in vitro diagnostic tests for detection of SARS-CoV-2 virus and/or diagnosis of COVID-19 infection under section 564(b)(1) of the Act, 21 U.S.C. 810FBP-1(W) (1), unless the authorization is terminated or revoked sooner. When diagnostic testing is negative, the possibility of a false negative result should be considered in the context of a patient's recent exposures and the presence of clinical signs and symptoms consistent with COVID-19. An individual without symptoms of COVID-19 and who is not shedding SARS-CoV-2 virus would  expect to have a negative (not detected) result in this assay.     Labs: CBC: Recent Labs  Lab 01/25/22 0546  WBC 5.0  HGB 13.2  HCT 37.8  MCV 89.2  PLT 258   Basic Metabolic Panel: Recent Labs  Lab 01/25/22 0546 01/26/22 0529  NA 139 140  K 3.2* 3.5  CL 108 111  CO2 22 24  GLUCOSE 105* 102*  BUN 7 7  CREATININE 0.64 0.47  CALCIUM 9.4 8.6*  MG 2.0  --    Liver Function Tests: No results for input(s): "AST", "ALT", "ALKPHOS", "BILITOT", "PROT", "ALBUMIN" in the last 168 hours. CBG: No results for input(s): "GLUCAP" in the last 168 hours.  Discharge time spent: greater than 30 minutes.  Signed: Ezekiel Slocumb, DO [facility] 01/30/2022

## 2022-01-26 NOTE — Care Management (Signed)
  Transition of Care [facility]) Screening Note   Patient Details  Name: Holley Kocurek Date of Birth: 05-Nov-1960   Transition of Care [facility]) CM/SW Contact:    Pete Pelt, RN Phone Number: 01/26/2022, 2:03 PM    Transition of Care Department [facility]) has reviewed patient and no TOC needs have been identified at this time. We will continue to monitor patient advancement through interdisciplinary progression rounds. If new patient transition needs arise, please place a TOC consult.

## 2022-01-27 ENCOUNTER — Telehealth: Payer: Self-pay

## 2022-01-27 ENCOUNTER — Ambulatory Visit (INDEPENDENT_AMBULATORY_CARE_PROVIDER_SITE_OTHER): Payer: Self-pay

## 2022-01-27 DIAGNOSIS — R55 Syncope and collapse: Secondary | ICD-10-CM

## 2022-01-27 NOTE — Telephone Encounter (Signed)
Called patient and informed her that we will be sending the Zio AT monitor out to her. Verified home address and emergency contact number.  Reviewed instructions for monitor and advised that the number provided for [employer] is a good source if any questions arise, as someone is there 24 hours a day, 7 days a week.   Patient was very grateful for the call and verbalized understanding.

## 2022-01-27 NOTE — Telephone Encounter (Signed)
-----   Message from Gerrie Nordmann, NP sent at 01/26/2022 12:06 PM EDT ----- Regarding: hospital follow up and Zio Patient will need a Zio AT monitor for 14 days mailed to her home for follow up on syncope. She will also need a hospital follow up appointment as a new patient in 4-5 weeks please and thank you

## 2022-01-29 DIAGNOSIS — R55 Syncope and collapse: Secondary | ICD-10-CM

## 2022-02-03 ENCOUNTER — Ambulatory Visit
Admission: RE | Admit: 2022-02-03 | Discharge: 2022-02-03 | Disposition: A | Discharge status: Home / Self Care | Payer: Self-pay | Source: Ambulatory Visit | Attending: Family Medicine | Admitting: Family Medicine

## 2022-02-03 NOTE — ED Triage Notes (Signed)
Pt presents today for staple removal.

## 2022-02-03 NOTE — ED Notes (Signed)
3 staples removed fro posterior sclpe. Incision CDand I

## 2022-02-11 ENCOUNTER — Telehealth: Payer: Self-pay | Admitting: Internal Medicine

## 2022-02-11 NOTE — Telephone Encounter (Signed)
Called by [employer] due to 90 second atrial fibrillation episode detected by ambulatory monitor with a rate of 83-103 bpm. Patient has a CHADSVASC of 2 for hypertension and female sex. Would recommend anticoagulation if the patient does not have a prohibitive risk for bleeding.   Rip Harbour, MD Cardiology

## 2022-02-18 ENCOUNTER — Encounter: Payer: Self-pay | Admitting: Cardiology

## 2022-02-18 ENCOUNTER — Other Ambulatory Visit: Payer: Self-pay

## 2022-02-18 ENCOUNTER — Ambulatory Visit (INDEPENDENT_AMBULATORY_CARE_PROVIDER_SITE_OTHER): Payer: Self-pay | Admitting: Cardiology

## 2022-02-18 VITALS — BP 140/74 | HR 70 | Ht 67.0 in | Wt 117.0 lb

## 2022-02-18 DIAGNOSIS — I4891 Unspecified atrial fibrillation: Secondary | ICD-10-CM | POA: Insufficient documentation

## 2022-02-18 DIAGNOSIS — R072 Precordial pain: Secondary | ICD-10-CM

## 2022-02-18 DIAGNOSIS — R55 Syncope and collapse: Secondary | ICD-10-CM

## 2022-02-18 DIAGNOSIS — J449 Chronic obstructive pulmonary disease, unspecified: Secondary | ICD-10-CM

## 2022-02-18 DIAGNOSIS — E785 Hyperlipidemia, unspecified: Secondary | ICD-10-CM

## 2022-02-18 DIAGNOSIS — I480 Paroxysmal atrial fibrillation: Secondary | ICD-10-CM | POA: Insufficient documentation

## 2022-02-18 DIAGNOSIS — D6869 Other thrombophilia: Secondary | ICD-10-CM | POA: Insufficient documentation

## 2022-02-18 DIAGNOSIS — I10 Essential (primary) hypertension: Secondary | ICD-10-CM

## 2022-02-18 MED ORDER — APIXABAN 5 MG PO TABS
5.0000 mg | ORAL_TABLET | Freq: Two times a day (BID) | ORAL | 6 refills | Status: DC
Start: 2022-02-18 — End: 2022-07-27
  Filled 2022-02-18 (×2): qty 60, 30d supply, fill #0
  Filled 2022-03-15: qty 180, 90d supply, fill #1
  Filled 2022-06-06: qty 180, 90d supply, fill #2

## 2022-02-18 MED ORDER — METOPROLOL TARTRATE 100 MG PO TABS
ORAL_TABLET | ORAL | 0 refills | Status: DC
  Filled 2022-02-18: qty 1, 1d supply, fill #0

## 2022-02-18 NOTE — Assessment & Plan Note (Signed)
Newly diagnosed A-fib on Zio patch monitor.  Noted on 816 11:06 PM.  By 8/17 was back in sinus rhythm.  She had that one unusual episode back this Monday confusion and dizziness which may or may not be arrhythmia genic in nature.  No further episodes of chest discomfort or dyspnea.  This patients CHA2DS2-VASc Score and unadjusted Ischemic Stroke Rate (% per year) is equal to 3.2 % stroke rate/year from a score of 3  Above score calculated as 1 point each if present [CHF, HTN, DM, Vascular=MI/PAD/Aortic Plaque, Age if 65-74, or Female] Above score calculated as 2 points each if present [Age > 75, or Stroke/TIA/TE]   Plan:  Initially DOAC-Eliquis to 5 mg twice daily (may just to either Xarelto or Pradaxa (depending on availability with an S4.)  Planning ischemic evaluation since she did have some chest discomfort in the setting of her syncope and has had some exertional dyspnea. 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  Currently on beta-blocker with adequate rate control.  We do have room to increase the dose to 10 mg bisoprolol which will also help with BP.

## 2022-02-18 NOTE — Assessment & Plan Note (Signed)
She did have some significant chest pain on the day of her syncopal episode.  No further episodes although she has had some unusual sensation on the left side of the chest.  Plan: Ischemic evaluation with Coronary CTA Continue beta-blocker

## 2022-02-18 NOTE — Assessment & Plan Note (Signed)
Unfortunately recent lipid panel available.  She is on the fenofibrate 40.  We will disease.  There is significant CAD adjust target level.  Would like to get lipid panel from PCP check at follow-up.

## 2022-02-18 NOTE — Patient Instructions (Addendum)
Medication Instructions:  - Your physician has recommended you make the following change in your medication:   1) START Eliquis 5 mg: - take 1 tablet by mouth TWICE daily (or every 12 hours)   Samples Given:  Eliquis 5 mg Lot: [id number] Exp: April 2025 # 2 boxes  *If you need a refill on your cardiac medications before your next appointment, please call your pharmacy*   Lab Work: -  none ordered  If you have labs (blood work) drawn today and your tests are completely normal, you will receive your results only by: [place] (if you have MyChart) OR A paper copy in the mail If you have any lab test that is abnormal or we need to change your treatment, we will call you to review the results.   Testing/Procedures: 1) Cardiac CT angiogram: Thursday 02/25/22 (arrive at 2:15 pm)  - Your physician has requested that you have cardiac CT. Cardiac computed tomography (CT) is a painless test that uses an x-ray machine to take clear, detailed pictures of your heart.     Your cardiac CT will be scheduled at:    [facility] [address] [place], [place] [address] [phone]  Please arrive 15 mins early for check-in and test prep.  Please follow these instructions carefully (unless otherwise directed):   On the Night Before the Test: Be sure to Drink plenty of water. Do not consume any caffeinated/decaffeinated beverages or chocolate 12 hours prior to your test. Do not take any antihistamines 12 hours prior to your test.   On the Day of the Test: Drink plenty of water until 1 hour prior to the test. Do not eat any food 4 hours prior to the test. You may take your regular medications prior to the test.  Take metoprolol tartrate (Lopressor) 100 mg two hours prior to test. FEMALES- please wear underwire-free bra if available, avoid dresses & tight clothing       After the Test: Drink plenty of water. After receiving IV  contrast, you may experience a mild flushed feeling. This is normal. On occasion, you may experience a mild rash up to 24 hours after the test. This is not dangerous. If this occurs, you can take Benadryl 25 mg and increase your fluid intake. If you experience trouble breathing, this can be serious. If it is severe call 911 IMMEDIATELY. If it is mild, please call our office.  Some insurance companies will need an authorization prior to the service being performed.   For non-scheduling related questions, please contact the cardiac imaging nurse navigator should you have any questions/concerns: Marchia Bond, Cardiac Imaging Nurse Navigator Gordy Clement, Cardiac Imaging Nurse Navigator [facility] Direct Office Dial: [phone]   For scheduling needs, including cancellations and rescheduling, please call [place], [phone].    Follow-Up: At [facility], you and your health needs are our priority.  As part of our continuing mission to provide you with exceptional heart care, we have created designated Provider Care Teams.  These Care Teams include your primary Cardiologist (physician) and Advanced Practice Providers (APPs -  Physician Assistants and Nurse Practitioners) who all work together to provide you with the care you need, when you need it.  We recommend signing up for the patient portal called "MyChart".  Sign up information is provided on this After Visit Summary.  MyChart is used to connect with patients for Virtual Visits (Telemedicine).  Patients are able to view lab/test  results, encounter notes, upcoming appointments, etc.  Non-urgent messages can be sent to your provider as well.   To learn more about what you can do with MyChart, go to [email or web address].    Your next appointment:   2-3 week(s)  The format for your next appointment:   In Person  Provider:   You may see Glenetta Hew, MD or one of the following Advanced Practice  Providers on your designated Care Team:   Murray Hodgkins, NP Christell Faith, PA-C Cadence Kathlen Mody, [place]    Other Instructions  Apixaban Tablets What is this medication? APIXABAN (a PIX a ban) prevents or treats blood clots. It is also used to lower the risk of stroke in people with AFib (atrial fibrillation). It belongs to a group of medications called blood thinners. This medicine may be used for other purposes; ask your health care provider or pharmacist if you have questions. COMMON BRAND NAME(S): Eliquis What should I tell my care team before I take this medication? They need to know if you have any of these conditions: Antiphospholipid antibody syndrome Bleeding disorder History of bleeding in the brain History of blood clots History of stomach bleeding Kidney disease Liver disease Mechanical heart valve Spinal surgery An unusual or allergic reaction to apixaban, other medications, foods, dyes, or preservatives Pregnant or trying to get pregnant Breast-feeding How should I use this medication? Take this medication by mouth. For your therapy to work as well as possible, take each dose exactly as prescribed on the prescription label. Do not skip doses. Skipping doses or stopping this medication can increase your risk of a blood clot or stroke. Keep taking this medication unless your care team tells you to stop. Take it as directed on the prescription label at the same time every day. You can take it with or without food. If it upsets your stomach, take it with food. A special MedGuide will be given to you by the pharmacist with each prescription and refill. Be sure to read this information carefully each time. Talk to your care team about the use of this medication in children. Special care may be needed. Overdosage: If you think you have taken too much of this medicine contact a poison control center or emergency room at once. NOTE: This medicine is only for you. Do not share this  medicine with others. What if I miss a dose? If you miss a dose, take it as soon as you can. If it is almost time for your next dose, take only that dose. Do not take double or extra doses. What may interact with this medication? This medication may interact with the following: Aspirin and aspirin-like medications Certain medications for fungal infections like itraconazole and ketoconazole Certain medications for seizures like carbamazepine and phenytoin Certain medications for blood clots like enoxaparin, dalteparin, heparin, and warfarin Clarithromycin NSAIDs, medications for pain and inflammation, like ibuprofen or naproxen Rifampin Ritonavir St. John's wort This list may not describe all possible interactions. Give your health care provider a list of all the medicines, herbs, non-prescription drugs, or dietary supplements you use. Also tell them if you smoke, drink alcohol, or use illegal drugs. Some items may interact with your medicine. What should I watch for while using this medication? Visit your healthcare professional for regular checks on your progress. You may need blood work done while you are taking this medication. Your condition will be monitored carefully while you are receiving this medication. It is important not to miss any appointments. Avoid  sports and activities that might cause injury while you are using this medication. Severe falls or injuries can cause unseen bleeding. Be careful when using sharp tools or knives. Consider using an Copy. Take special care brushing or flossing your teeth. Report any injuries, bruising, or red spots on the skin to your healthcare professional. If you are going to need surgery or other procedure, tell your healthcare professional that you are taking this medication. Wear a medical ID bracelet or chain. Carry a card that describes your disease and details of your medication and dosage times. What side effects may I notice from  receiving this medication? Side effects that you should report to your care team as soon as possible: Allergic reactions--skin rash, itching, hives, swelling of the face, lips, tongue, or throat Bleeding--bloody or black, tar-like stools, vomiting blood or brown material that looks like coffee grounds, red or dark brown urine, small red or purple spots on the skin, unusual bruising or bleeding Bleeding in the brain--severe headache, stiff neck, confusion, dizziness, change in vision, numbness or weakness of the face, arm, or leg, trouble speaking, trouble walking, vomiting Heavy periods This list may not describe all possible side effects. Call your doctor for medical advice about side effects. You may report side effects to FDA at 1-800-FDA-1088. Where should I keep my medication? Keep out of the reach of children and pets. Store at room temperature between 20 and 25 degrees C (68 and 77 degrees F). Get rid of any unused medication after the expiration date. To get rid of medications that are no longer needed or expired: Take the medication to a medication take-back program. Check with your pharmacy or law enforcement to find a location. If you cannot return the medication, check the label or package insert to see if the medication should be thrown out in the garbage or flushed down the toilet. If you are not sure, ask your care team. If it is safe to put in the trash, empty the medication out of the container. Mix the medication with cat litter, dirt, coffee grounds, or other unwanted substance. Seal the mixture in a bag or container. Put it in the trash. NOTE: This sheet is a summary. It may not cover all possible information. If you have questions about this medicine, talk to your doctor, pharmacist, or health care provider.  2023 Elsevier/Gold Standard (2020-07-11 00:00:00)   Cardiac CT Angiogram A cardiac CT angiogram is a procedure to look at the heart and the area around the heart. It may  be done to help find the cause of chest pains or other symptoms of heart disease. During this procedure, a substance called contrast dye is injected into the blood vessels in the area to be checked. A large X-ray machine, called a CT scanner, then takes detailed pictures of the heart and the surrounding area. The procedure is also sometimes called a coronary CT angiogram, coronary artery scanning, or CTA. A cardiac CT angiogram allows the health care provider to see how well blood is flowing to and from the heart. The health care provider will be able to see if there are any problems, such as: Blockage or narrowing of the coronary arteries in the heart. Fluid around the heart. Signs of weakness or disease in the muscles, valves, and tissues of the heart. Tell a health care provider about: Any allergies you have. This is especially important if you have had a previous allergic reaction to contrast dye. All medicines you are taking,  including vitamins, herbs, eye drops, creams, and over-the-counter medicines. Any blood disorders you have. Any surgeries you have had. Any medical conditions you have. Whether you are pregnant or may be pregnant. Any anxiety disorders, chronic pain, or other conditions you have that may increase your stress or prevent you from lying still. What are the risks? Generally, this is a safe procedure. However, problems may occur, including: Bleeding. Infection. Allergic reactions to medicines or dyes. Damage to other structures or organs. Kidney damage from the contrast dye that is used. Increased risk of cancer from radiation exposure. This risk is low. Talk with your health care provider about: The risks and benefits of testing. How you can receive the lowest dose of radiation. What happens before the procedure? Wear comfortable clothing and remove any jewelry, glasses, dentures, and hearing aids. Follow instructions from your health care provider about eating and  drinking. This may include: For 12 hours before the procedure -- avoid caffeine. This includes tea, coffee, soda, energy drinks, and diet pills. Drink plenty of water or other fluids that do not have caffeine in them. Being well hydrated can prevent complications. For 4-6 hours before the procedure -- stop eating and drinking. The contrast dye can cause nausea, but this is less likely if your stomach is empty. Ask your health care provider about changing or stopping your regular medicines. This is especially important if you are taking diabetes medicines, blood thinners, or medicines to treat problems with erections (erectile dysfunction). What happens during the procedure?  Hair on your chest may need to be removed so that small sticky patches called electrodes can be placed on your chest. These will transmit information that helps to monitor your heart during the procedure. An IV will be inserted into one of your veins. You might be given a medicine to control your heart rate during the procedure. This will help to ensure that good images are obtained. You will be asked to lie on an exam table. This table will slide in and out of the CT machine during the procedure. Contrast dye will be injected into the IV. You might feel warm, or you may get a metallic taste in your mouth. You will be given a medicine called nitroglycerin. This will relax or dilate the arteries in your heart. The table that you are lying on will move into the CT machine tunnel for the scan. The person running the machine will give you instructions while the scans are being done. You may be asked to: Keep your arms above your head. Hold your breath. Stay very still, even if the table is moving. When the scanning is complete, you will be moved out of the machine. The IV will be removed. The procedure may vary among health care providers and hospitals. What can I expect after the procedure? After your procedure, it is common to  have: A metallic taste in your mouth from the contrast dye. A feeling of warmth. A headache from the nitroglycerin. Follow these instructions at home: Take over-the-counter and prescription medicines only as told by your health care provider. If you are told, drink enough fluid to keep your urine pale yellow. This will help to flush the contrast dye out of your body. Most people can return to their normal activities right after the procedure. Ask your health care provider what activities are safe for you. It is up to you to get the results of your procedure. Ask your health care provider, or the department that is  doing the procedure, when your results will be ready. Keep all follow-up visits as told by your health care provider. This is important. Contact a health care provider if: You have any symptoms of allergy to the contrast dye. These include: Shortness of breath. Rash or hives. A racing heartbeat. Summary A cardiac CT angiogram is a procedure to look at the heart and the area around the heart. It may be done to help find the cause of chest pains or other symptoms of heart disease. During this procedure, a large X-ray machine, called a CT scanner, takes detailed pictures of the heart and the surrounding area after a contrast dye has been injected into blood vessels in the area. Ask your health care provider about changing or stopping your regular medicines before the procedure. This is especially important if you are taking diabetes medicines, blood thinners, or medicines to treat erectile dysfunction. If you are told, drink enough fluid to keep your urine pale yellow. This will help to flush the contrast dye out of your body. This information is not intended to replace advice given to you by your health care provider. Make sure you discuss any questions you have with your health care provider. Document Revised: 10/01/2021 Document Reviewed: 02/07/2019 Elsevier Patient Education  [place]

## 2022-02-18 NOTE — Assessment & Plan Note (Signed)
Borderline BP today Ziac 5-6.25 mg daily.  Appropriate on beta-blocker with the diagnosis of A-fib, blood pressure still little high, could consider simply increasing the dose of Ziac versus adding ARB depending ischemic evaluation.

## 2022-02-18 NOTE — Assessment & Plan Note (Signed)
CHA2DS2-VASc score was 3 based on hypertension, female and CT evidence of coronary/aortic atherosclerosis.  We will start DOAC-initiate with Eliquis as first option but can adjust to ED using Xarelto or Pradaxa depending on availability and cost.

## 2022-02-18 NOTE — Progress Notes (Signed)
Primary Care Provider: Rutherford Limerick, PA [facility] Cardiologist: None Electrophysiologist: None  Clinic Note: Chief Complaint  Patient presents with   New Patient (Initial Visit)    Follow up [place] monitor. Patient c/o elevated blood pressure and headache everyday this week, feeling disoriented on Monday, February 15, 2022. Medications reviewed by the patient verbally.    Hospitalization Follow-up   ===================================  ASSESSMENT/PLAN   Problem List Items Addressed This Visit       Cardiology Problems   New onset atrial fibrillation ([place]): CHA2DS2Vasc = 3 (HTN, F, Ao Atherosclerosis).    Newly diagnosed A-fib on Zio patch monitor.  Noted on 816 11:06 PM.  By 8/17 was back in sinus rhythm.  She had that one unusual episode back this Monday confusion and dizziness which may or may not be arrhythmia genic in nature.  No further episodes of chest discomfort or dyspnea.  This patients CHA2DS2-VASc Score and unadjusted Ischemic Stroke Rate (% per year) is equal to 3.2 % stroke rate/year from a score of 3  Above score calculated as 1 point each if present [CHF, HTN, DM, Vascular=MI/PAD/Aortic Plaque, Age if 65-74, or Female] Above score calculated as 2 points each if present [Age > 75, or Stroke/TIA/TE]   Plan: Initially DOAC-Eliquis to 5 mg twice daily (may just to either Xarelto or Pradaxa (depending on availability with an S4.) Planning ischemic evaluation since she did have some chest discomfort in the setting of her syncope and has had some exertional dyspnea. => Further risk stratification with ischemic evaluation Coronary CTA with possible FFRCT. Currently on beta-blocker with adequate rate control.  We do have room to increase the dose to 10 mg bisoprolol which will also help with BP.        Relevant Medications   apixaban (ELIQUIS) 5 MG TABS tablet   metoprolol tartrate (LOPRESSOR) 100 MG tablet   Other Relevant Orders   EKG 12-Lead    Hypercoagulable state due to paroxysmal atrial fibrillation (HCC)    CHA2DS2-VASc score was 3 based on hypertension, female and CT evidence of coronary/aortic atherosclerosis.  We will start DOAC-initiate with Eliquis as first option but can adjust to ED using Xarelto or Pradaxa depending on availability and cost.      Relevant Medications   apixaban (ELIQUIS) 5 MG TABS tablet   metoprolol tartrate (LOPRESSOR) 100 MG tablet   HLD (hyperlipidemia) (Chronic)    Unfortunately recent lipid panel available.  She is on the fenofibrate 40.  We will disease.  There is significant CAD adjust target level.  Would like to get lipid panel from PCP check at follow-up.      Relevant Medications   apixaban (ELIQUIS) 5 MG TABS tablet   metoprolol tartrate (LOPRESSOR) 100 MG tablet   Other Relevant Orders   EKG 12-Lead   HTN (hypertension) (Chronic)    Borderline BP today Ziac 5-6.25 mg daily.  Appropriate on beta-blocker with the diagnosis of A-fib, blood pressure still little high, could consider simply increasing the dose of Ziac versus adding ARB depending ischemic evaluation.      Relevant Medications   apixaban (ELIQUIS) 5 MG TABS tablet   metoprolol tartrate (LOPRESSOR) 100 MG tablet   Other Relevant Orders   EKG 12-Lead     Other   Precordial pain - Primary    She did have some significant chest pain on the day of her syncopal episode.  No further episodes although she has had some unusual sensation on the left  side of the chest.  Plan: Ischemic evaluation with Coronary CTA Continue beta-blocker      Relevant Orders   EKG 12-Lead   CT CORONARY MORPH W/CTA COR W/SCORE W/CA W/CM &/OR WO/CM   COPD (chronic obstructive pulmonary disease) (HCC) (Chronic)   Syncope (Chronic)   Relevant Orders   EKG 12-Lead   ===================================  HPI:    Jill King is a 61 y.o. female with a PMH notable for COPD, hypertension, hyperlipidemia, GERD, eosinophilic esophagitis, former  smoker, alcohol use, chronic low back pain who is being seen today for the evaluation of HOSPITAL F/U FOR SYNCOPE -> and NEW ONSET (NEW DIAGNOSIS) ATRIAL FIBRILLATION at the request of August Luz A, PA.  Jill King was referred following hospitalization for syncope.  Recent Hospitalizations:  November 29, 2021: ER visit for COPD exacerbation. 01/25/2022:  Admitted for Syncope  "She got up from bed, went to the bathroom and was walking into the kitchen, then suddenly passed out in kitchen at about 4:00 AM last night. Per her fianc at the bedside, patient passed out for less than 1 minutes.  No seizure activity.  Did not have prodromal symptoms.  Denies dizziness, unilateral numbness or tingling in extremities, no facial droop or slurred speech. The day before, she was having issues breathing.  Chest was tight. => that has improved since Rx with steroids (no more CP)." 2D Echo Ordered along with Zio patch monitor  Reviewed  CV studies:    The following studies were reviewed today: (if available, images/films reviewed: From Epic Chart or Care Everywhere) TTE 01/25/2022: Normal EF 55 to 60%.  Normal LV size and function.  No RWMA.  Normal DFxn.  Normal RV/RVP and RAP.  Normal valves. Zio patch results not fully returned available for review.  However could have been a result showed new diagnosis of A-fib from 8/16 to 02/11/2022-heart rates 83 to 108 bpm.  Interval History:   Jill King returns here today for initial post hospital follow-up.  In fact her Zio patch monitor has not completely been read.  She actually has not had any of further symptoms of syncope or near syncope since her discharge.  She has not had any further episodes of chest pain or pressure or dyspnea.  No fatigue.  She had 1 day this week (on Monday) where she had intermittent confusion and disorientation and lethargy that went on and off throughout the day and gradually got better.  This is associated with some dizziness and poor  balance.  When I asked her about symptoms leading up to her syncopal episode, it sounds like the day before she was profoundly dyspneic coughing and some chest tightness.  She had gone to bed early that night after using her inhaler.  She states she woke up because she needed to go to the bathroom but was very short of breath with walking to the bathroom.  She then went to the bathroom and was on her way to the kitchen to get something to drink because she felt lightheaded. She remembers that her husband was looking over her.  She did not have any signs or symptoms to suggest any irregular heartbeats palpitations and was not having chest pain at the time of her passing out.  It was before she went to bed.  She denied any orthopnea, PND or edema.  She also says that the chest pain improved with using inhaler.  I asked her about having symptoms while wearing the monitor.  She denied any  significant symptoms of irregular heartbeats or palpitations.  No worsening dyspnea or chest pain.  CV Review of Symptoms (Summary) Cardiovascular ROS: positive for - chest pain, loss of consciousness, shortness of breath, and episode of dizziness and confusion per HPI. negative for - edema, irregular heartbeat, orthopnea, palpitations, paroxysmal nocturnal dyspnea, rapid heart rate, or further episodes of syncope/near syncope or TIA/amaurosis fugax.  No claudication.  REVIEWED OF SYSTEMS   Review of Systems  Constitutional:  Positive for malaise/fatigue (Initially after her hospital stay, but now doing better). Negative for weight loss.  HENT:  Negative for congestion.   Respiratory:  Positive for cough (Mild chronic cough, but nothing like prior to her syncope.) and shortness of breath (Per HPI).   Cardiovascular:  Positive for chest pain (Per HPI).  Gastrointestinal:  Negative for blood in stool and melena.  Genitourinary:  Negative for hematuria.  Musculoskeletal:  Negative for back pain and falls.   Neurological:  Positive for dizziness (Per HPI) and loss of consciousness (Per HPI.  No further episodes.). Negative for weakness.  Psychiatric/Behavioral:  Negative for depression and memory loss. The patient is not nervous/anxious and does not have insomnia.     I have reviewed and (if needed) personally updated the patient's problem list, medications, allergies, past medical and surgical history, social and family history.   PAST MEDICAL HISTORY   Past Medical History:  Diagnosis Date   Alcohol use    Arthritis    hands, wrist   Asthma    COPD (chronic obstructive pulmonary disease) (HCC)    Emphysema of lung (HCC)    GERD (gastroesophageal reflux disease)    Eosinophilic esophagitis 1610   Osteoporosis    Wears dentures    full upper and lower  Aortic atherosclerosis.ON CT.   PAST SURGICAL HISTORY   Past Surgical History:  Procedure Laterality Date   CESAREAN SECTION     COLONOSCOPY WITH PROPOFOL N/A 01/08/2016   Procedure: COLONOSCOPY WITH PROPOFOL;  Surgeon: Lucilla Lame, MD;  Location: [place];  Service: Endoscopy;  Laterality: N/A;   ESOPHAGOGASTRODUODENOSCOPY (EGD) WITH PROPOFOL N/A 01/08/2016   Procedure: ESOPHAGOGASTRODUODENOSCOPY (EGD) WITH PROPOFOL;  Surgeon: Lucilla Lame, MD;  Location: [place];  Service: Endoscopy;  Laterality: N/A;   MULTIPLE TOOTH EXTRACTIONS     POLYPECTOMY  01/08/2016   Procedure: POLYPECTOMY;  Surgeon: Lucilla Lame, MD;  Location: [place];  Service: Endoscopy;;    Immunization History  Administered Date(s) Administered   Tdap 01/25/2022    MEDICATIONS/ALLERGIES   Current Meds  Medication Sig   albuterol (PROVENTIL HFA) 108 (90 Base) MCG/ACT inhaler Inhale 2 puffs into the lungs once every 4 to 6 hours as needed.   alendronate (FOSAMAX) 70 MG tablet TAKE 1 TABLET BY MOUTH ONCE A WEEK ON SUNDAYS.   apixaban (ELIQUIS) 5 MG TABS tablet Take 1 tablet (5 mg total) by mouth 2 (two) times daily.    bisoprolol-hydrochlorothiazide (ZIAC) 5-6.25 MG tablet TAKE 1 TABLET BY MOUTH ONCE DAILY.   esomeprazole (NEXIUM) 40 MG capsule Take 40 mg by mouth daily at 12 noon.   fenofibrate (TRICOR) 48 MG tablet TAKE 1 TABLET BY MOUTH ONCE A DAY   fluticasone (FLONASE) 50 MCG/ACT nasal spray Place 2 sprays into both nostrils daily.   linaclotide (LINZESS) 145 MCG CAPS capsule TAKE 1 CAPSULE BY MOUTH ONCE DAILY FOR CONSTIPATION.   metoprolol tartrate (LOPRESSOR) 100 MG tablet Take 1 tablet (100 mg) by mouth 2 hours prior to your Cardiac CT  Spacer/Aero-Holding Chambers (AEROCHAMBER MV) inhaler Use as instructed    No Known Allergies  SOCIAL HISTORY/FAMILY HISTORY   Reviewed in Epic:   Social History   Tobacco Use   Smoking status: Former    Packs/day: 1.00    Years: 30.00    Total pack years: 30.00    Types: Cigarettes    Quit date: 12/26/2014    Years since quitting: 7.1   Smokeless tobacco: Never  Vaping Use   Vaping Use: Never used  Substance Use Topics   Alcohol use: Yes    Comment: 1 drink /2 weeks   Drug use: No   Social History   Social History Narrative   Not on file   Family History  Problem Relation Age of Onset   COPD Mother    Diabetes Father    Heart disease Father    CAD Brother     OBJCTIVE -PE, EKG, labs   Wt Readings from Last 3 Encounters:  02/18/22 117 lb (53.1 kg)  01/25/22 122 lb (55.3 kg)  11/29/21 116 lb (52.6 kg)    Physical Exam: BP (!) 140/74 (BP Location: Left Arm, Patient Position: Sitting, Cuff Size: Normal)   Pulse 70   Ht '5\' 7"'$  (1.702 m)   Wt 117 lb (53.1 kg)   SpO2 98%   BMI 18.32 kg/m  Physical Exam Vitals reviewed.  Constitutional:      General: She is not in acute distress.    Appearance: Normal appearance. She is normal weight. She is not ill-appearing or toxic-appearing.  HENT:     Head: Normocephalic and atraumatic.  Neck:     Vascular: No carotid bruit or JVD.  Cardiovascular:     Rate and Rhythm: Normal rate and  regular rhythm. No extrasystoles are present.    Chest Wall: PMI is not displaced.     Pulses: Normal pulses.     Heart sounds: Normal heart sounds, S1 normal and S2 normal. No murmur heard.    No friction rub. No gallop.  Pulmonary:     Effort: Pulmonary effort is normal. No respiratory distress.     Breath sounds: Normal breath sounds. No wheezing, rhonchi or rales.  Chest:     Chest wall: No tenderness.  Abdominal:     General: Abdomen is flat. Bowel sounds are normal. There is no distension.     Palpations: Abdomen is soft.     Tenderness: There is no abdominal tenderness. There is no guarding.  Musculoskeletal:        General: No swelling. Normal range of motion.     Cervical back: Normal range of motion and neck supple.  Skin:    General: Skin is warm and dry.  Neurological:     General: No focal deficit present.     Mental Status: She is alert and oriented to person, place, and time.     Cranial Nerves: No cranial nerve deficit.     Gait: Gait normal.  Psychiatric:        Mood and Affect: Mood normal.        Behavior: Behavior normal.        Thought Content: Thought content normal.        Judgment: Judgment normal.     Adult ECG Report  Rate: 70 ;  Rhythm: normal sinus rhythm and normal axis, intervals & durations ;   Narrative Interpretation: normal.  Recent Labs:  not sure,but thinks PCP checked lipids in May. Not available   No  results found for: "CHOL", "HDL", "LDLCALC", "LDLDIRECT", "TRIG", "CHOLHDL" Lab Results  Component Value Date   CREATININE 0.47 01/26/2022   BUN 7 01/26/2022   NA 140 01/26/2022   K 3.5 01/26/2022   CL 111 01/26/2022   CO2 24 01/26/2022      Latest Ref Rng & Units 01/25/2022    5:46 AM 12/18/2018   11:56 PM 03/17/2016    9:37 AM  CBC  WBC 4.0 - 10.5 K/uL 5.0  6.5  6.2   Hemoglobin 12.0 - 15.0 g/dL 13.2  13.2  14.5   Hematocrit 36.0 - 46.0 % 37.8  37.6  41.0   Platelets 150 - 400 K/uL 237  231  230     No results found for:  "HGBA1C" No results found for: "TSH"  ================================================== I spent a total of 33 minutes with the patient spent in direct patient consultation.  Additional time spent with chart review  / charting (studies, outside notes, etc): 28 min Total Time: 61 min  Current medicines are reviewed at length with the patient today.  (+/- concerns) none  Notice: This dictation was prepared with Dragon dictation along with smart phrase technology. Any transcriptional errors that result from this process are unintentional and may not be corrected upon review.   Studies Ordered:  Orders Placed This Encounter  Procedures   CT CORONARY MORPH W/CTA COR W/SCORE W/CA W/CM &/OR WO/CM   EKG 12-Lead   Meds ordered this encounter  Medications   apixaban (ELIQUIS) 5 MG TABS tablet    Sig: Take 1 tablet (5 mg total) by mouth 2 (two) times daily.    Dispense:  60 tablet    Refill:  6   metoprolol tartrate (LOPRESSOR) 100 MG tablet    Sig: Take 1 tablet (100 mg) by mouth 2 hours prior to your Cardiac CT    Dispense:  1 tablet    Refill:  0    Patient Instructions / Medication Changes & Studies & Tests Ordered   Patient Instructions  Medication Instructions:  - Your physician has recommended you make the following change in your medication:   1) START Eliquis 5 mg: - take 1 tablet by mouth TWICE daily (or every 12 hours)   Samples Given:  Eliquis 5 mg Lot: [id number] Exp: April 2025 # 2 boxes  *If you need a refill on your cardiac medications before your next appointment, please call your pharmacy*   Lab Work: -  none ordered  If you have labs (blood work) drawn today and your tests are completely normal, you will receive your results only by: MyChart Message (if you have MyChart) OR A paper copy in the mail If you have any lab test that is abnormal or we need to change your treatment, we will call you to review the results.   Testing/Procedures: 1) Cardiac CT  angiogram: Thursday 02/25/22 (arrive at 2:15 pm)  - Your physician has requested that you have cardiac CT. Cardiac computed tomography (CT) is a painless test that uses an x-ray machine to take clear, detailed pictures of your heart.     Your cardiac CT will be scheduled at:    [facility] [address] [place], [place] [address] [phone]  Please arrive 15 mins early for check-in and test prep.  Please follow these instructions carefully (unless otherwise directed):   On the Night Before the Test: Be sure to Drink plenty of water. Do not consume any caffeinated/decaffeinated beverages  or chocolate 12 hours prior to your test. Do not take any antihistamines 12 hours prior to your test.   On the Day of the Test: Drink plenty of water until 1 hour prior to the test. Do not eat any food 4 hours prior to the test. You may take your regular medications prior to the test.  Take metoprolol tartrate (Lopressor) 100 mg two hours prior to test. FEMALES- please wear underwire-free bra if available, avoid dresses & tight clothing       After the Test: Drink plenty of water. After receiving IV contrast, you may experience a mild flushed feeling. This is normal. On occasion, you may experience a mild rash up to 24 hours after the test. This is not dangerous. If this occurs, you can take Benadryl 25 mg and increase your fluid intake. If you experience trouble breathing, this can be serious. If it is severe call 911 IMMEDIATELY. If it is mild, please call our office.  Some insurance companies will need an authorization prior to the service being performed.   For non-scheduling related questions, please contact the cardiac imaging nurse navigator should you have any questions/concerns: Marchia Bond, Cardiac Imaging Nurse Navigator Gordy Clement, Cardiac Imaging Nurse Navigator [facility] Direct Office Dial:  [phone]   For scheduling needs, including cancellations and rescheduling, please call [place], [phone].    Follow-Up: At [facility], you and your health needs are our priority.  As part of our continuing mission to provide you with exceptional heart care, we have created designated Provider Care Teams.  These Care Teams include your primary Cardiologist (physician) and Advanced Practice Providers (APPs -  Physician Assistants and Nurse Practitioners) who all work together to provide you with the care you need, when you need it.  We recommend signing up for the patient portal called "MyChart".  Sign up information is provided on this After Visit Summary.  MyChart is used to connect with patients for Virtual Visits (Telemedicine).  Patients are able to view lab/test results, encounter notes, upcoming appointments, etc.  Non-urgent messages can be sent to your provider as well.   To learn more about what you can do with MyChart, go to [email or web address].    Your next appointment:   2-3 week(s)  The format for your next appointment:   In Person  Provider:   You may see Glenetta Hew, MD or one of the following Advanced Practice Providers on your designated Care Team:   Murray Hodgkins, NP Christell Faith, PA-C Cadence Kathlen Mody, [place]    Other Instructions  Apixaban Tablets What is this medication? APIXABAN (a PIX a ban) prevents or treats blood clots. It is also used to lower the risk of stroke in people with AFib (atrial fibrillation). It belongs to a group of medications called blood thinners. This medicine may be used for other purposes; ask your health care provider or pharmacist if you have questions. COMMON BRAND NAME(S): Eliquis What should I tell my care team before I take this medication? They need to know if you have any of these conditions: Antiphospholipid antibody syndrome Bleeding disorder History of bleeding in the brain History of blood clots History of  stomach bleeding Kidney disease Liver disease Mechanical heart valve Spinal surgery An unusual or allergic reaction to apixaban, other medications, foods, dyes, or preservatives Pregnant or trying to get pregnant Breast-feeding How should I use this medication? Take this medication by mouth. For your therapy to work as well as possible, take  each dose exactly as prescribed on the prescription label. Do not skip doses. Skipping doses or stopping this medication can increase your risk of a blood clot or stroke. Keep taking this medication unless your care team tells you to stop. Take it as directed on the prescription label at the same time every day. You can take it with or without food. If it upsets your stomach, take it with food. A special MedGuide will be given to you by the pharmacist with each prescription and refill. Be sure to read this information carefully each time. Talk to your care team about the use of this medication in children. Special care may be needed. Overdosage: If you think you have taken too much of this medicine contact a poison control center or emergency room at once. NOTE: This medicine is only for you. Do not share this medicine with others. What if I miss a dose? If you miss a dose, take it as soon as you can. If it is almost time for your next dose, take only that dose. Do not take double or extra doses. What may interact with this medication? This medication may interact with the following: Aspirin and aspirin-like medications Certain medications for fungal infections like itraconazole and ketoconazole Certain medications for seizures like carbamazepine and phenytoin Certain medications for blood clots like enoxaparin, dalteparin, heparin, and warfarin Clarithromycin NSAIDs, medications for pain and inflammation, like ibuprofen or naproxen Rifampin Ritonavir St. John's wort This list may not describe all possible interactions. Give your health care provider a  list of all the medicines, herbs, non-prescription drugs, or dietary supplements you use. Also tell them if you smoke, drink alcohol, or use illegal drugs. Some items may interact with your medicine. What should I watch for while using this medication? Visit your healthcare professional for regular checks on your progress. You may need blood work done while you are taking this medication. Your condition will be monitored carefully while you are receiving this medication. It is important not to miss any appointments. Avoid sports and activities that might cause injury while you are using this medication. Severe falls or injuries can cause unseen bleeding. Be careful when using sharp tools or knives. Consider using an Copy. Take special care brushing or flossing your teeth. Report any injuries, bruising, or red spots on the skin to your healthcare professional. If you are going to need surgery or other procedure, tell your healthcare professional that you are taking this medication. Wear a medical ID bracelet or chain. Carry a card that describes your disease and details of your medication and dosage times. What side effects may I notice from receiving this medication? Side effects that you should report to your care team as soon as possible: Allergic reactions--skin rash, itching, hives, swelling of the face, lips, tongue, or throat Bleeding--bloody or black, tar-like stools, vomiting blood or brown material that looks like coffee grounds, red or dark brown urine, small red or purple spots on the skin, unusual bruising or bleeding Bleeding in the brain--severe headache, stiff neck, confusion, dizziness, change in vision, numbness or weakness of the face, arm, or leg, trouble speaking, trouble walking, vomiting Heavy periods This list may not describe all possible side effects. Call your doctor for medical advice about side effects. You may report side effects to FDA at 1-800-FDA-1088. Where  should I keep my medication? Keep out of the reach of children and pets. Store at room temperature between 20 and 25 degrees C (68 and 77  degrees F). Get rid of any unused medication after the expiration date. To get rid of medications that are no longer needed or expired: Take the medication to a medication take-back program. Check with your pharmacy or law enforcement to find a location. If you cannot return the medication, check the label or package insert to see if the medication should be thrown out in the garbage or flushed down the toilet. If you are not sure, ask your care team. If it is safe to put in the trash, empty the medication out of the container. Mix the medication with cat litter, dirt, coffee grounds, or other unwanted substance. Seal the mixture in a bag or container. Put it in the trash. NOTE: This sheet is a summary. It may not cover all possible information. If you have questions about this medicine, talk to your doctor, pharmacist, or health care provider.  2023 Elsevier/Gold Standard (2020-07-11 00:00:00)   Cardiac CT Angiogram A cardiac CT angiogram is a procedure to look at the heart and the area around the heart. It may be done to help find the cause of chest pains or other symptoms of heart disease. During this procedure, a substance called contrast dye is injected into the blood vessels in the area to be checked. A large X-ray machine, called a CT scanner, then takes detailed pictures of the heart and the surrounding area. The procedure is also sometimes called a coronary CT angiogram, coronary artery scanning, or CTA. A cardiac CT angiogram allows the health care provider to see how well blood is flowing to and from the heart. The health care provider will be able to see if there are any problems, such as: Blockage or narrowing of the coronary arteries in the heart. Fluid around the heart. Signs of weakness or disease in the muscles, valves, and tissues of the  heart. Tell a health care provider about: Any allergies you have. This is especially important if you have had a previous allergic reaction to contrast dye. All medicines you are taking, including vitamins, herbs, eye drops, creams, and over-the-counter medicines. Any blood disorders you have. Any surgeries you have had. Any medical conditions you have. Whether you are pregnant or may be pregnant. Any anxiety disorders, chronic pain, or other conditions you have that may increase your stress or prevent you from lying still. What are the risks? Generally, this is a safe procedure. However, problems may occur, including: Bleeding. Infection. Allergic reactions to medicines or dyes. Damage to other structures or organs. Kidney damage from the contrast dye that is used. Increased risk of cancer from radiation exposure. This risk is low. Talk with your health care provider about: The risks and benefits of testing. How you can receive the lowest dose of radiation. What happens before the procedure? Wear comfortable clothing and remove any jewelry, glasses, dentures, and hearing aids. Follow instructions from your health care provider about eating and drinking. This may include: For 12 hours before the procedure -- avoid caffeine. This includes tea, coffee, soda, energy drinks, and diet pills. Drink plenty of water or other fluids that do not have caffeine in them. Being well hydrated can prevent complications. For 4-6 hours before the procedure -- stop eating and drinking. The contrast dye can cause nausea, but this is less likely if your stomach is empty. Ask your health care provider about changing or stopping your regular medicines. This is especially important if you are taking diabetes medicines, blood thinners, or medicines to treat problems with  erections (erectile dysfunction). What happens during the procedure?  Hair on your chest may need to be removed so that small sticky patches  called electrodes can be placed on your chest. These will transmit information that helps to monitor your heart during the procedure. An IV will be inserted into one of your veins. You might be given a medicine to control your heart rate during the procedure. This will help to ensure that good images are obtained. You will be asked to lie on an exam table. This table will slide in and out of the CT machine during the procedure. Contrast dye will be injected into the IV. You might feel warm, or you may get a metallic taste in your mouth. You will be given a medicine called nitroglycerin. This will relax or dilate the arteries in your heart. The table that you are lying on will move into the CT machine tunnel for the scan. The person running the machine will give you instructions while the scans are being done. You may be asked to: Keep your arms above your head. Hold your breath. Stay very still, even if the table is moving. When the scanning is complete, you will be moved out of the machine. The IV will be removed. The procedure may vary among health care providers and hospitals. What can I expect after the procedure? After your procedure, it is common to have: A metallic taste in your mouth from the contrast dye. A feeling of warmth. A headache from the nitroglycerin. Follow these instructions at home: Take over-the-counter and prescription medicines only as told by your health care provider. If you are told, drink enough fluid to keep your urine pale yellow. This will help to flush the contrast dye out of your body. Most people can return to their normal activities right after the procedure. Ask your health care provider what activities are safe for you. It is up to you to get the results of your procedure. Ask your health care provider, or the department that is doing the procedure, when your results will be ready. Keep all follow-up visits as told by your health care provider. This is  important. Contact a health care provider if: You have any symptoms of allergy to the contrast dye. These include: Shortness of breath. Rash or hives. A racing heartbeat. Summary A cardiac CT angiogram is a procedure to look at the heart and the area around the heart. It may be done to help find the cause of chest pains or other symptoms of heart disease. During this procedure, a large X-ray machine, called a CT scanner, takes detailed pictures of the heart and the surrounding area after a contrast dye has been injected into blood vessels in the area. Ask your health care provider about changing or stopping your regular medicines before the procedure. This is especially important if you are taking diabetes medicines, blood thinners, or medicines to treat erectile dysfunction. If you are told, drink enough fluid to keep your urine pale yellow. This will help to flush the contrast dye out of your body. This information is not intended to replace advice given to you by your health care provider. Make sure you discuss any questions you have with your health care provider. Document Revised: 10/01/2021 Document Reviewed: 02/07/2019 Elsevier Patient Education  2023 Elsevier Inc.   Important Information About Sugar           Leonie Man, MD, MS Glenetta Hew, M.D., M.S. [place]  Office   [address] [place], [place]  [address] [phone]           Fax [phone]    Thank you for choosing [facility] in [place]!!

## 2022-02-19 ENCOUNTER — Other Ambulatory Visit: Payer: Self-pay

## 2022-02-24 ENCOUNTER — Telehealth (HOSPITAL_COMMUNITY): Payer: Self-pay | Admitting: *Deleted

## 2022-02-24 NOTE — Telephone Encounter (Signed)
Reaching out to patient to offer assistance regarding upcoming cardiac imaging study; pt verbalizes understanding of appt date/time, parking situation and where to check in, pre-test NPO status and medications ordered, and verified current allergies; name and call back number provided for further questions should they arise  Gordy Clement RN Navigator Cardiac Imaging [facility] [phone] office [phone] cell  Patient to hold her Ziac and take '100mg'$  metoprolol tartrate two hours prior to her cardiac CT scan.

## 2022-02-25 ENCOUNTER — Ambulatory Visit
Admission: RE | Admit: 2022-02-25 | Discharge: 2022-02-25 | Disposition: A | Discharge status: Home / Self Care | Payer: Self-pay | Source: Ambulatory Visit | Attending: Cardiology | Admitting: Cardiology

## 2022-02-25 DIAGNOSIS — R072 Precordial pain: Secondary | ICD-10-CM | POA: Insufficient documentation

## 2022-02-25 MED ORDER — METOPROLOL TARTRATE 5 MG/5ML IV SOLN
10.0000 mg | Freq: Once | INTRAVENOUS | Status: AC
  Administered 2022-02-25: 10 mg via INTRAVENOUS

## 2022-02-25 MED ORDER — NITROGLYCERIN 0.4 MG SL SUBL
0.8000 mg | SUBLINGUAL_TABLET | Freq: Once | SUBLINGUAL | Status: AC
  Administered 2022-02-25: 0.8 mg via SUBLINGUAL

## 2022-02-25 MED ORDER — IOHEXOL 350 MG/ML SOLN
75.0000 mL | Freq: Once | INTRAVENOUS | Status: AC | PRN
  Administered 2022-02-25: 75 mL via INTRAVENOUS

## 2022-02-25 NOTE — Progress Notes (Signed)
Patient tolerated CT well. Drank water after. Vital signs stable encourage to drink water throughout day.Reasons explained and verbalized understanding. Ambulated steady gait.  

## 2022-03-03 ENCOUNTER — Telehealth: Payer: Self-pay | Admitting: Cardiology

## 2022-03-03 NOTE — Telephone Encounter (Signed)
The patient has reviewed her normal results on her MyChart.  I attempted to call her to insure no questions at this time. No answer- I left a message asking her to call back if she has further questions about her normal results.   She is scheduled to see Cadence Kathlen Mody, PA in the office tomorrow (03/04/22).

## 2022-03-03 NOTE — Telephone Encounter (Signed)
Leonie Man, MD  02/28/2022  5:59 PM EDT     Coronary CT angiogram results: 20 calcium score of 0.  No evidence of any notable cirrhosis plaque in the coronary arteries.   LOW RISK.   Consider nonatherosclerotic cause for chest pain.     Glenetta Hew, MD

## 2022-03-04 ENCOUNTER — Ambulatory Visit: Payer: Self-pay | Attending: Medical | Admitting: Medical

## 2022-03-04 ENCOUNTER — Encounter: Payer: Self-pay | Admitting: Medical

## 2022-03-04 ENCOUNTER — Other Ambulatory Visit
Admission: RE | Admit: 2022-03-04 | Discharge: 2022-03-04 | Disposition: A | Discharge status: Home / Self Care | Payer: Self-pay | Source: Ambulatory Visit | Attending: Medical | Admitting: Medical

## 2022-03-04 VITALS — BP 140/100 | HR 100 | Ht 66.0 in | Wt 119.0 lb

## 2022-03-04 DIAGNOSIS — I4891 Unspecified atrial fibrillation: Secondary | ICD-10-CM | POA: Insufficient documentation

## 2022-03-04 DIAGNOSIS — R55 Syncope and collapse: Secondary | ICD-10-CM

## 2022-03-04 DIAGNOSIS — E785 Hyperlipidemia, unspecified: Secondary | ICD-10-CM | POA: Insufficient documentation

## 2022-03-04 DIAGNOSIS — I10 Essential (primary) hypertension: Secondary | ICD-10-CM

## 2022-03-04 DIAGNOSIS — R0602 Shortness of breath: Secondary | ICD-10-CM

## 2022-03-04 LAB — CBC
HCT: 36.7 % (ref 36.0–46.0)
Hemoglobin: 12.8 g/dL (ref 12.0–15.0)
MCH: 31.1 pg (ref 26.0–34.0)
MCHC: 34.9 g/dL (ref 30.0–36.0)
MCV: 89.3 fL (ref 80.0–100.0)
Platelets: 220 10*3/uL (ref 150–400)
RBC: 4.11 MIL/uL (ref 3.87–5.11)
RDW: 10.8 % — ABNORMAL LOW (ref 11.5–15.5)
WBC: 5.3 10*3/uL (ref 4.0–10.5)
nRBC: 0 % (ref 0.0–0.2)

## 2022-03-04 LAB — LIPID PANEL
Cholesterol: 218 mg/dL — ABNORMAL HIGH (ref 0–200)
HDL: 67 mg/dL (ref >40)
LDL Cholesterol: 124 mg/dL — ABNORMAL HIGH (ref 0–99)
Total CHOL/HDL Ratio: 3.3 RATIO
Triglycerides: 135 mg/dL (ref <150)
VLDL: 27 mg/dL (ref 0–40)

## 2022-03-04 MED ORDER — METOPROLOL TARTRATE 25 MG PO TABS: 25.0000 mg | ORAL_TABLET | Freq: Two times a day (BID) | ORAL | 1 refills | Status: DC

## 2022-03-04 NOTE — Progress Notes (Signed)
Cardiology Office Note:    Date:  03/04/2022   ID:  Jill King, DOB 03/09/1961, MRN [id number]  PCP:  Rutherford Limerick, PA  [facility] Cardiologist:  Glenetta Hew, MD  [facility] Electrophysiologist:  None   Referring MD: Rutherford Limerick, PA   Chief Complaint: 1 month follow-up  History of Present Illness:    Jill King is a 61 y.o. female with a hx of Afib on Eliquis, HLD, HTN, COPD, prior tobacco use who presents with 1 month follow-up.   Hospitalized in June 2023 for syncope. She also reported chest tightness. Echo and heart monitor were ordered. Echo showed normal EF 55-60%, normal LV size and function. No WMA, normal RV/RVP, normal valves. Heart monitor noted on 8/16 to be in afib. She was seen in the cardiology clinic for the first time 8/24. Due to her CHADSVASC of 3 the patient was started on Eliquis. Due to chest tightness a coronary CTA was ordered.   Coronary CTA showed calcium score of 0 with no CAD. Echo showed normal LVEF and no valvular abnormalities.   Today, the patient reports intermittent lightheadedness, as if she wants to faint, but doesn't. This has occurred twice since the last visit. She has rare palpitations. No chest pain or SOB. No LLE, orthopnea, pnd. She has chronic SOB. Cardiac CTA and echo were reviewed in detail today, which were overall reassuring. She is active at baseline, but does no formal activity. She quit smoking 7 years ago. Denies bleeding issues on Eliquis.   Past Medical History:  Diagnosis Date   Alcohol use    Arthritis    hands, wrist   Asthma    COPD (chronic obstructive pulmonary disease) (HCC)    Emphysema of lung (HCC)    GERD (gastroesophageal reflux disease)    Eosinophilic esophagitis 6759   Osteoporosis    Wears dentures    full upper and lower    Past Surgical History:  Procedure Laterality Date   CESAREAN SECTION     COLONOSCOPY WITH PROPOFOL N/A 01/08/2016   Procedure: COLONOSCOPY WITH PROPOFOL;  Surgeon:  Lucilla Lame, MD;  Location: [facility];  Service: Endoscopy;  Laterality: N/A;   ESOPHAGOGASTRODUODENOSCOPY (EGD) WITH PROPOFOL N/A 01/08/2016   Procedure: ESOPHAGOGASTRODUODENOSCOPY (EGD) WITH PROPOFOL;  Surgeon: Lucilla Lame, MD;  Location: [facility];  Service: Endoscopy;  Laterality: N/A;   MULTIPLE TOOTH EXTRACTIONS     POLYPECTOMY  01/08/2016   Procedure: POLYPECTOMY;  Surgeon: Lucilla Lame, MD;  Location: [facility];  Service: Endoscopy;;    Current Medications: Current Meds  Medication Sig   albuterol (PROVENTIL HFA) 108 (90 Base) MCG/ACT inhaler Inhale 2 puffs into the lungs once every 4 to 6 hours as needed.   alendronate (FOSAMAX) 70 MG tablet TAKE 1 TABLET BY MOUTH ONCE A WEEK ON SUNDAYS.   apixaban (ELIQUIS) 5 MG TABS tablet Take 1 tablet (5 mg total) by mouth 2 (two) times daily.   esomeprazole (NEXIUM) 40 MG capsule Take 40 mg by mouth daily at 12 noon.   fenofibrate (TRICOR) 48 MG tablet TAKE 1 TABLET BY MOUTH ONCE A DAY   fluticasone (FLONASE) 50 MCG/ACT nasal spray Place 2 sprays into both nostrils daily.   linaclotide (LINZESS) 145 MCG CAPS capsule TAKE 1 CAPSULE BY MOUTH ONCE DAILY FOR CONSTIPATION.   metoprolol tartrate (LOPRESSOR) 25 MG tablet Take 1 tablet (25 mg total) by mouth 2 (two) times daily.   Spacer/Aero-Holding Chambers (AEROCHAMBER MV) inhaler Use as instructed   [  DISCONTINUED] bisoprolol-hydrochlorothiazide (ZIAC) 5-6.25 MG tablet TAKE 1 TABLET BY MOUTH ONCE DAILY.   [DISCONTINUED] metoprolol tartrate (LOPRESSOR) 100 MG tablet Take 1 tablet (100 mg) by mouth 2 hours prior to your Cardiac CT     Allergies:   Patient has no known allergies.   Social History   Socioeconomic History   Marital status: Single    Spouse name: Not on file   Number of children: Not on file   Years of education: Not on file   Highest education level: Not on file  Occupational History   Not on file  Tobacco Use   Smoking status: Former    Packs/day:  1.00    Years: 30.00    Total pack years: 30.00    Types: Cigarettes    Quit date: 12/26/2014    Years since quitting: 7.1   Smokeless tobacco: Never  Vaping Use   Vaping Use: Never used  Substance and Sexual Activity   Alcohol use: Yes    Comment: 1 drink /2 weeks   Drug use: No   Sexual activity: Not on file  Other Topics Concern   Not on file  Social History Narrative   Not on file   Social Determinants of Health   Financial Resource Strain: Not on file  Food Insecurity: Not on file  Transportation Needs: Not on file  Physical Activity: Not on file  Stress: Not on file  Social Connections: Not on file     Family History: The patient's family history includes CAD in her brother; COPD in her mother; Diabetes in her father; Heart disease in her father.  ROS:   Please see the history of present illness.     All other systems reviewed and are negative.  EKGs/Labs/Other Studies Reviewed:    The following studies were reviewed today:  Coronary CTA 01/2022 1. Coronary calcium score of 0. Patient is low risk for coronary events.   2. Normal coronary origin with right dominance.   3. No evidence of CAD.   4. CAD-RADS 0. Consider non-atherosclerotic causes of chest pain.   Electronically Signed: By: Kate Sable M.D. On: 02/25/2022 15:38  Heart monitor 01/2021   Patient had a min HR of 56 bpm, max HR of 194 bpm, and avg HR of 80 bpm. Predominant underlying rhythm was Sinus Rhythm. 11 Supraventricular Tachycardia runs occurred, the run with the fastest interval lasting 8 beats with a max rate of 194 bpm, the  longest lasting 8 beats with an avg rate of 126 bpm. Atrial Fibrillation occurred (<1% burden), ranging from 80-150 bpm (avg of 104 bpm), the longest lasting 2 hours 40 mins with an avg rate of 104 bpm. Isolated SVEs were rare (<1.0%), SVE Couplets were  rare (<1.0%), and SVE Triplets were rare (<1.0%). Isolated VEs were rare (<1.0%), VE Couplets were rare  (<1.0%), and no VE Triplets were present. Ventricular Bigeminy was present. Previously notified for MD notification criteria for First Documentation  of Atrial Fibrillation met - Notified Dr. Gwynneth Aliment on 11 Feb 2022 at 2:33 AM ET [facility]).  Echo 12/2021 1. Left ventricular ejection fraction, by estimation, is 55 to 60%. The  left ventricle has normal function. The left ventricle has no regional  wall motion abnormalities. Left ventricular diastolic parameters were  normal.   2. Right ventricular systolic function is normal. The right ventricular  size is normal. Tricuspid regurgitation signal is inadequate for assessing  PA pressure.   3. The mitral valve is normal in structure.  No evidence of mitral valve  regurgitation. No evidence of mitral stenosis.   4. The aortic valve is normal in structure. Aortic valve regurgitation is  not visualized. No aortic stenosis is present.   5. The inferior vena cava is normal in size with greater than 50%  respiratory variability, suggesting right atrial pressure of 3 mmHg.   EKG:  EKG is NSR 100bpm, no ST/T wave changes  Recent Labs: 10/19/2021: ALT 27 01/25/2022: Magnesium 2.0 01/26/2022: BUN 7; Creatinine, Ser 0.47; Potassium 3.5; Sodium 140 03/04/2022: Hemoglobin 12.8; Platelets 220  Recent Lipid Panel    Component Value Date/Time   CHOL 218 (H) 03/04/2022 0913   TRIG 135 03/04/2022 0913   HDL 67 03/04/2022 0913   CHOLHDL 3.3 03/04/2022 0913   VLDL 27 03/04/2022 0913   LDLCALC 124 (H) 03/04/2022 0913    Physical Exam:    VS:  BP (!) 140/100 (BP Location: Left Arm, Patient Position: Sitting, Cuff Size: Normal)   Pulse 100   Ht '5\' 6"'  (1.676 m)   Wt 119 lb (54 kg)   SpO2 98%   BMI 19.21 kg/m     Wt Readings from Last 3 Encounters:  03/04/22 119 lb (54 kg)  02/18/22 117 lb (53.1 kg)  01/25/22 122 lb (55.3 kg)     GEN:  Well nourished, well developed in no acute distress HEENT: Normal NECK: No JVD; No carotid bruits LYMPHATICS: No  lymphadenopathy CARDIAC: RRR, no murmurs, rubs, gallops RESPIRATORY:  Clear to auscultation without rales, wheezing or rhonchi  ABDOMEN: Soft, non-tender, non-distended MUSCULOSKELETAL:  No edema; No deformity  SKIN: Warm and dry NEUROLOGIC:  Alert and oriented x 3 PSYCHIATRIC:  Normal affect   ASSESSMENT:    1. New onset atrial fibrillation ([place])   2. Primary hypertension   3. Hyperlipidemia, unspecified hyperlipidemia type   4. Shortness of breath   5. Pre-syncope    PLAN:    In order of problems listed above:  Paroxysmal Afib Recently diagnosed with Afib by heart monitor and started on Eliquis 86m BID for CHADSVASC of 3. She is in NSR with a heart rate of 100bpm. She reports intermittent lightheadedness. She is on bisoprolol-HCTZ pill, but BP is still mildly elevated. Prior echo showed normal LVEF.  BP is a little high as well. I will stop bisoprolol-HCTZ and start metoprolol 223mBID. I will check a CBC today.   Lightheadedness/Pre-syncope unclear etiology. BP and heart rate are normal. EKG with NSR. No palpitations reported. Prior heart monitor showed Afib as above, but unsure this is what's causing symptoms. I will stop Ziac pill as above and start Lopressor. I recommended good hydration. Prior echo with normal LVEF and no valvular disease.   SOB/COPD Echo and cardiac CTA were overall reassuring. Suspect COPD/lung contributing. I will check a CBC as above.   HLD I will update fasting lipid panel today. She is on fenofibrate.    Disposition: Follow up in 6 week(s) with MD/APP    Signed, Ellard Nan H Ninfa MeekerPA-C  03/04/2022 10:07 AM    [place]

## 2022-03-04 NOTE — Patient Instructions (Signed)
Medication Instructions:   Your physician has recommended you make the following change in your medication:   STOP bisoprolol-hctz (Ziac)  START metoprolol tartrate 25 mg TWICE daily   *If you need a refill on your cardiac medications before your next appointment, please call your pharmacy*   Lab Work:  Today at the medical mall: Fasting Lipid panel, CBC  -  Please go to the [facility] in at the Registration Desk: 1st desk to the right, past the screening table   If you have labs (blood work) drawn today and your tests are completely normal, you will receive your results only by: [place] (if you have MyChart) OR A paper copy in the mail If you have any lab test that is abnormal or we need to change your treatment, we will call you to review the results.   Testing/Procedures:  None ordered   Follow-Up: At [facility], you and your health needs are our priority.  As part of our continuing mission to provide you with exceptional heart care, we have created designated Provider Care Teams.  These Care Teams include your primary Cardiologist (physician) and Advanced Practice Providers (APPs -  Physician Assistants and Nurse Practitioners) who all work together to provide you with the care you need, when you need it.  We recommend signing up for the patient portal called "MyChart".  Sign up information is provided on this After Visit Summary.  MyChart is used to connect with patients for Virtual Visits (Telemedicine).  Patients are able to view lab/test results, encounter notes, upcoming appointments, etc.  Non-urgent messages can be sent to your provider as well.   To learn more about what you can do with MyChart, go to [email or web address].    Your next appointment:   6 week(s)  The format for your next appointment:   In Person  Provider:   You may see Dr. Glenetta Hew or one of the following Advanced Practice Providers on your  designated Care Team:   Murray Hodgkins, NP Christell Faith, PA-C Cadence Kathlen Mody, PA-C Gerrie Nordmann, NP    Important Information About Sugar

## 2022-03-08 ENCOUNTER — Other Ambulatory Visit: Payer: Self-pay

## 2022-03-08 MED ORDER — APIXABAN 5 MG PO TABS
ORAL_TABLET | ORAL | 3 refills | Status: DC
  Filled 2022-08-23: qty 180, 90d supply, fill #0

## 2022-03-11 ENCOUNTER — Ambulatory Visit: Payer: Self-pay | Admitting: Cardiology

## 2022-03-15 ENCOUNTER — Other Ambulatory Visit: Payer: Self-pay

## 2022-03-16 ENCOUNTER — Other Ambulatory Visit: Payer: Self-pay

## 2022-03-22 ENCOUNTER — Other Ambulatory Visit: Payer: Self-pay

## 2022-03-23 ENCOUNTER — Other Ambulatory Visit: Payer: Self-pay

## 2022-03-31 ENCOUNTER — Other Ambulatory Visit: Payer: Self-pay | Admitting: Medical

## 2022-04-05 ENCOUNTER — Other Ambulatory Visit: Payer: Self-pay

## 2022-04-16 ENCOUNTER — Ambulatory Visit: Payer: Self-pay | Attending: Medical | Admitting: Medical

## 2022-04-16 ENCOUNTER — Encounter: Payer: Self-pay | Admitting: Medical

## 2022-04-16 VITALS — BP 128/76 | HR 79 | Ht 66.0 in | Wt 119.8 lb

## 2022-04-16 DIAGNOSIS — E782 Mixed hyperlipidemia: Secondary | ICD-10-CM

## 2022-04-16 DIAGNOSIS — J449 Chronic obstructive pulmonary disease, unspecified: Secondary | ICD-10-CM

## 2022-04-16 DIAGNOSIS — R55 Syncope and collapse: Secondary | ICD-10-CM

## 2022-04-16 DIAGNOSIS — I480 Paroxysmal atrial fibrillation: Secondary | ICD-10-CM

## 2022-04-16 NOTE — Patient Instructions (Signed)
Medication Instructions:  Your physician recommends that you continue on your current medications as directed. Please refer to the Current Medication list given to you today.   Labwork: None  Testing/Procedures: None  Follow-Up: Follow up with Cadence Furth, PA-C in 4 months.   Any Other Special Instructions Will Be Listed Below (If Applicable).     If you need a refill on your cardiac medications before your next appointment, please call your pharmacy.

## 2022-04-16 NOTE — Progress Notes (Signed)
Cardiology Office Note:    Date:  04/16/2022   ID:  Jill King, DOB 1961-04-10, MRN [id number]  PCP:  Rutherford Limerick, PA  [facility] Cardiologist:  Glenetta Hew, MD  [facility] Electrophysiologist:  None   Referring MD: Rutherford Limerick, PA   Chief Complaint: 6 week follow-up  History of Present Illness:    Jill King is a 61 y.o. female with a hx of Afib on Eliquis, HLD, HTN, COPD, prior tobacco use who presents with 1 month follow-up.    Hospitalized in June 2023 for syncope. She also reported chest tightness. Echo and heart monitor were ordered. Echo showed normal EF 55-60%, normal LV size and function. No WMA, normal RV/RVP, normal valves. Heart monitor noted on 8/16 to be in afib. She was seen in the cardiology clinic for the first time 8/24. Due to her CHADSVASC of 3 the patient was started on Eliquis. Due to chest tightness a coronary CTA was ordered.    Coronary CTA showed calcium score of 0 with no CAD. Echo showed normal LVEF and no valvular abnormalities.   Last seen 03/04/22 and and reported lightheadedness. She was in NSR with a heart rate of 100bpm. Bisoprolol-HCTZ was stopped and she was started on metoprolol.   Today, the patient is in NSR with a heart rate of 79bpm. She denies bleeding with Eliquis. She is doing better on the lopressor. No further dizziness or lightheadedness. BP is well controlled. She denies chest pain, shortness of breath, palpitations, LLE, orthopnea or pnd.   Past Medical History:  Diagnosis Date   Alcohol use    Arthritis    hands, wrist   Asthma    COPD (chronic obstructive pulmonary disease) (HCC)    Emphysema of lung (HCC)    GERD (gastroesophageal reflux disease)    Eosinophilic esophagitis 4259   Osteoporosis    Wears dentures    full upper and lower    Past Surgical History:  Procedure Laterality Date   CESAREAN SECTION     COLONOSCOPY WITH PROPOFOL N/A 01/08/2016   Procedure: COLONOSCOPY WITH PROPOFOL;  Surgeon:  Lucilla Lame, MD;  Location: [place];  Service: Endoscopy;  Laterality: N/A;   ESOPHAGOGASTRODUODENOSCOPY (EGD) WITH PROPOFOL N/A 01/08/2016   Procedure: ESOPHAGOGASTRODUODENOSCOPY (EGD) WITH PROPOFOL;  Surgeon: Lucilla Lame, MD;  Location: [place];  Service: Endoscopy;  Laterality: N/A;   MULTIPLE TOOTH EXTRACTIONS     POLYPECTOMY  01/08/2016   Procedure: POLYPECTOMY;  Surgeon: Lucilla Lame, MD;  Location: [place];  Service: Endoscopy;;    Current Medications: Current Meds  Medication Sig   albuterol (PROVENTIL HFA) 108 (90 Base) MCG/ACT inhaler Inhale 2 puffs into the lungs once every 4 to 6 hours as needed.   alendronate (FOSAMAX) 70 MG tablet TAKE 1 TABLET BY MOUTH ONCE A WEEK ON SUNDAYS.   apixaban (ELIQUIS) 5 MG TABS tablet Take 1 tablet (5 mg total) by mouth 2 (two) times daily.   esomeprazole (NEXIUM) 40 MG capsule Take 40 mg by mouth daily at 12 noon.   fenofibrate (TRICOR) 48 MG tablet TAKE 1 TABLET BY MOUTH ONCE A DAY   fluticasone (FLONASE) 50 MCG/ACT nasal spray Place 2 sprays into both nostrils daily.   linaclotide (LINZESS) 145 MCG CAPS capsule TAKE 1 CAPSULE BY MOUTH ONCE DAILY FOR CONSTIPATION.   metoprolol tartrate (LOPRESSOR) 25 MG tablet TAKE 1 TABLET(25 MG) BY MOUTH TWICE DAILY   Spacer/Aero-Holding Chambers (AEROCHAMBER MV) inhaler Use as instructed  Allergies:   Patient has no known allergies.   Social History   Socioeconomic History   Marital status: Single    Spouse name: Not on file   Number of children: Not on file   Years of education: Not on file   Highest education level: Not on file  Occupational History   Not on file  Tobacco Use   Smoking status: Former    Packs/day: 1.00    Years: 30.00    Total pack years: 30.00    Types: Cigarettes    Quit date: 12/26/2014    Years since quitting: 7.3   Smokeless tobacco: Never  Vaping Use   Vaping Use: Never used  Substance and Sexual Activity   Alcohol use: Yes     Comment: 1 drink /2 weeks   Drug use: No   Sexual activity: Not on file  Other Topics Concern   Not on file  Social History Narrative   Not on file   Social Determinants of Health   Financial Resource Strain: Not on file  Food Insecurity: Not on file  Transportation Needs: Not on file  Physical Activity: Not on file  Stress: Not on file  Social Connections: Not on file     Family History: The patient's family history includes CAD in her brother; COPD in her mother; Diabetes in her father; Heart disease in her father.  ROS:   Please see the history of present illness.     All other systems reviewed and are negative.  EKGs/Labs/Other Studies Reviewed:    The following studies were reviewed today:  Coronary CTA 01/2022 1. Coronary calcium score of 0. Patient is low risk for coronary events.   2. Normal coronary origin with right dominance.   3. No evidence of CAD.   4. CAD-RADS 0. Consider non-atherosclerotic causes of chest pain.   Electronically Signed: By: Kate Sable M.D. On: 02/25/2022 15:38   Heart monitor 01/2021   Patient had a min HR of 56 bpm, max HR of 194 bpm, and avg HR of 80 bpm. Predominant underlying rhythm was Sinus Rhythm. 11 Supraventricular Tachycardia runs occurred, the run with the fastest interval lasting 8 beats with a max rate of 194 bpm, the  longest lasting 8 beats with an avg rate of 126 bpm. Atrial Fibrillation occurred (<1% burden), ranging from 80-150 bpm (avg of 104 bpm), the longest lasting 2 hours 40 mins with an avg rate of 104 bpm. Isolated SVEs were rare (<1.0%), SVE Couplets were  rare (<1.0%), and SVE Triplets were rare (<1.0%). Isolated VEs were rare (<1.0%), VE Couplets were rare (<1.0%), and no VE Triplets were present. Ventricular Bigeminy was present. Previously notified for MD notification criteria for First Documentation  of Atrial Fibrillation met - Notified Dr. Gwynneth Aliment on 11 Feb 2022 at 2:33 AM ET [facility]).   Echo  12/2021 1. Left ventricular ejection fraction, by estimation, is 55 to 60%. The  left ventricle has normal function. The left ventricle has no regional  wall motion abnormalities. Left ventricular diastolic parameters were  normal.   2. Right ventricular systolic function is normal. The right ventricular  size is normal. Tricuspid regurgitation signal is inadequate for assessing  PA pressure.   3. The mitral valve is normal in structure. No evidence of mitral valve  regurgitation. No evidence of mitral stenosis.   4. The aortic valve is normal in structure. Aortic valve regurgitation is  not visualized. No aortic stenosis is present.   5. The  inferior vena cava is normal in size with greater than 50%  respiratory variability, suggesting right atrial pressure of 3 mmHg.   EKG:  EKG is ordered today.  The ekg ordered today demonstrates NSR, 79bpm, nonspecific T wave changes  Recent Labs: 10/19/2021: ALT 27 01/25/2022: Magnesium 2.0 01/26/2022: BUN 7; Creatinine, Ser 0.47; Potassium 3.5; Sodium 140 03/04/2022: Hemoglobin 12.8; Platelets 220  Recent Lipid Panel    Component Value Date/Time   CHOL 218 (H) 03/04/2022 0913   TRIG 135 03/04/2022 0913   HDL 67 03/04/2022 0913   CHOLHDL 3.3 03/04/2022 0913   VLDL 27 03/04/2022 0913   LDLCALC 124 (H) 03/04/2022 0913     Physical Exam:    VS:  BP 128/76 (BP Location: Left Arm, Patient Position: Sitting, Cuff Size: Normal)   Pulse 79   Ht '5\' 6"'  (1.676 m)   Wt 119 lb 12.8 oz (54.3 kg)   SpO2 98%   BMI 19.34 kg/m     Wt Readings from Last 3 Encounters:  04/16/22 119 lb 12.8 oz (54.3 kg)  03/04/22 119 lb (54 kg)  02/18/22 117 lb (53.1 kg)     GEN:  Well nourished, well developed in no acute distress HEENT: Normal NECK: No JVD; No carotid bruits LYMPHATICS: No lymphadenopathy CARDIAC: RRR, no murmurs, rubs, gallops RESPIRATORY:  Clear to auscultation without rales, wheezing or rhonchi  ABDOMEN: Soft, non-tender,  non-distended MUSCULOSKELETAL:  No edema; No deformity  SKIN: Warm and dry NEUROLOGIC:  Alert and oriented x 3 PSYCHIATRIC:  Normal affect   ASSESSMENT:    1. Paroxysmal A-fib ([place])   2. Pre-syncope   3. Chronic obstructive pulmonary disease, unspecified COPD type ([place])   4. Hyperlipidemia, mixed    PLAN:    In order of problems listed above:  Paroxysmal Afib She is in NSR with heart rate of 79bpm. She is tolerating Eliquis well with no bleeding issues. Continue Lopressor 71m BID for rate control. Continue Eliquis for stroke ppx for CHADSVASC of 3.   Lightheadedness/Pre-syncope Resolved after stopped Bisoprolol-HCTZ. No further work-up.  COPD Stable breathing reported, no acute exacerbation.  HLD LDL 124, recommended lifestyle changes.  Disposition: Follow up in 4 month(s) with MD/APP    Signed, Kenshin Splawn HNinfa Meeker PA-C  04/16/2022 8:33 AM    [facility]

## 2022-04-19 NOTE — Addendum Note (Signed)
Addended by: James Ivanoff D on: 04/19/2022 12:46 PM   Modules accepted: Orders

## 2022-05-31 ENCOUNTER — Other Ambulatory Visit: Payer: Self-pay

## 2022-06-06 ENCOUNTER — Other Ambulatory Visit: Payer: Self-pay

## 2022-06-07 ENCOUNTER — Other Ambulatory Visit: Payer: Self-pay

## 2022-06-10 ENCOUNTER — Other Ambulatory Visit: Payer: Self-pay

## 2022-06-16 ENCOUNTER — Other Ambulatory Visit: Payer: Self-pay

## 2022-06-17 ENCOUNTER — Other Ambulatory Visit: Payer: Self-pay

## 2022-06-18 ENCOUNTER — Other Ambulatory Visit: Payer: Self-pay

## 2022-06-18 ENCOUNTER — Telehealth: Payer: Self-pay | Admitting: Cardiology

## 2022-06-18 MED ORDER — METOPROLOL TARTRATE 25 MG PO TABS: ORAL_TABLET | ORAL | 0 refills | Status: DC

## 2022-06-18 NOTE — Telephone Encounter (Signed)
*  STAT* If patient is at the pharmacy, call can be transferred to refill team.   1. Which medications need to be refilled? (please list name of each medication and dose if known)  metoprolol tartrate (LOPRESSOR) 25 MG tablet   2. Which pharmacy/location (including street and city if local pharmacy) is medication to be sent to?  [facility] [place], [address] AT [facility] [address]    3. Do they need a 30 day or 90 day supply? 90 day supply

## 2022-06-18 NOTE — Telephone Encounter (Signed)
Requested Prescriptions   Signed Prescriptions Disp Refills   metoprolol tartrate (LOPRESSOR) 25 MG tablet 180 tablet 0    Sig: TAKE 1 TABLET(25 MG) BY MOUTH TWICE DAILY    Authorizing Provider: Antony Madura    Ordering User: NEWCOMER MCCLAIN, Cortne Amara L

## 2022-07-05 ENCOUNTER — Ambulatory Visit: Payer: Self-pay | Attending: Medical | Admitting: Medical

## 2022-07-05 ENCOUNTER — Other Ambulatory Visit
Admission: RE | Admit: 2022-07-05 | Discharge: 2022-07-05 | Disposition: A | Discharge status: Home / Self Care | Payer: Self-pay | Source: Ambulatory Visit | Attending: Medical | Admitting: Medical

## 2022-07-05 ENCOUNTER — Encounter: Payer: Self-pay | Admitting: Medical

## 2022-07-05 VITALS — BP 148/80 | HR 86 | Ht 66.0 in | Wt 127.2 lb

## 2022-07-05 DIAGNOSIS — R0602 Shortness of breath: Secondary | ICD-10-CM | POA: Insufficient documentation

## 2022-07-05 DIAGNOSIS — I480 Paroxysmal atrial fibrillation: Secondary | ICD-10-CM | POA: Insufficient documentation

## 2022-07-05 DIAGNOSIS — J449 Chronic obstructive pulmonary disease, unspecified: Secondary | ICD-10-CM

## 2022-07-05 DIAGNOSIS — M7989 Other specified soft tissue disorders: Secondary | ICD-10-CM

## 2022-07-05 DIAGNOSIS — E782 Mixed hyperlipidemia: Secondary | ICD-10-CM

## 2022-07-05 LAB — BASIC METABOLIC PANEL
Anion gap: 8 (ref 5–15)
BUN: 16 mg/dL (ref 6–20)
CO2: 27 mmol/L (ref 22–32)
Calcium: 9.4 mg/dL (ref 8.9–10.3)
Chloride: 104 mmol/L (ref 98–111)
Creatinine, Ser: 0.71 mg/dL (ref 0.44–1.00)
GFR, Estimated: >60 mL/min (ref >60)
Glucose, Bld: 126 mg/dL — ABNORMAL HIGH (ref 70–99)
Potassium: 4.5 mmol/L (ref 3.5–5.1)
Sodium: 139 mmol/L (ref 135–145)

## 2022-07-05 LAB — BRAIN NATRIURETIC PEPTIDE: B Natriuretic Peptide: 40.8 pg/mL (ref 0.0–100.0)

## 2022-07-05 LAB — CBC
HCT: 38.4 % (ref 36.0–46.0)
Hemoglobin: 13.1 g/dL (ref 12.0–15.0)
MCH: 30.7 pg (ref 26.0–34.0)
MCHC: 34.1 g/dL (ref 30.0–36.0)
MCV: 89.9 fL (ref 80.0–100.0)
Platelets: 234 10*3/uL (ref 150–400)
RBC: 4.27 MIL/uL (ref 3.87–5.11)
RDW: 11.5 % (ref 11.5–15.5)
WBC: 5.8 10*3/uL (ref 4.0–10.5)
nRBC: 0 % (ref 0.0–0.2)

## 2022-07-05 NOTE — Progress Notes (Signed)
Cardiology Office Note:    Date:  07/05/2022   ID:  Jill King, DOB December 31, 1960, MRN [id number]  PCP:  Rutherford Limerick, PA  [facility] Cardiologist:  Glenetta Hew, MD  [facility] Electrophysiologist:  None   Referring MD: Rutherford Limerick, PA   Chief Complaint: 3 month follow-up  History of Present Illness:    Jill King is a 62 y.o. female with a hx of Afib on Eliquis, HLD, HTN, COPD, prior tobacco use who presents with 3 month follow-up.    Hospitalized in June 2023 for syncope. She also reported chest tightness. Echo and heart monitor were ordered. Echo showed normal EF 55-60%, normal LV size and function. No WMA, normal RV/RVP, normal valves. Heart monitor noted on 8/16 to be in afib. She was seen in the cardiology clinic for the first time 8/24. Due to her CHADSVASC of 3 the patient was started on Eliquis. Due to chest tightness a coronary CTA was ordered.    Coronary CTA showed calcium score of 0 with no CAD. Echo showed normal LVEF and no valvular abnormalities.    She was seen 03/04/22 and and reported lightheadedness. She was in NSR with a heart rate of 100bpm. Bisoprolol-HCTZ was stopped and she was started on metoprolol.  Last seen 03/2022 and was in NSR with a heart rate of 79bpm. She denied further dizziness or lightheadedness.   Today the patient reports left sided lower leg edema on Thursday (4 days ago). She drank a lot of water that day. She has a picture on her phone. No swelling on the right side. Since then swelling has gone down. She also reported numbness and tingling in that leg, no overt pain. She reports intermittent chest tightness and shortness of breath. Patient is taking Eliquis '5mg'$  BID. BP is high, but she has not had her Lopressor.   Past Medical History:  Diagnosis Date   Alcohol use    Arthritis    hands, wrist   Asthma    COPD (chronic obstructive pulmonary disease) (HCC)    Emphysema of lung (HCC)    GERD (gastroesophageal reflux disease)     Eosinophilic esophagitis 9562   Osteoporosis    Wears dentures    full upper and lower    Past Surgical History:  Procedure Laterality Date   CESAREAN SECTION     COLONOSCOPY WITH PROPOFOL N/A 01/08/2016   Procedure: COLONOSCOPY WITH PROPOFOL;  Surgeon: Lucilla Lame, MD;  Location: [place];  Service: Endoscopy;  Laterality: N/A;   ESOPHAGOGASTRODUODENOSCOPY (EGD) WITH PROPOFOL N/A 01/08/2016   Procedure: ESOPHAGOGASTRODUODENOSCOPY (EGD) WITH PROPOFOL;  Surgeon: Lucilla Lame, MD;  Location: [place];  Service: Endoscopy;  Laterality: N/A;   MULTIPLE TOOTH EXTRACTIONS     POLYPECTOMY  01/08/2016   Procedure: POLYPECTOMY;  Surgeon: Lucilla Lame, MD;  Location: [place];  Service: Endoscopy;;    Current Medications: Current Meds  Medication Sig   albuterol (PROVENTIL HFA) 108 (90 Base) MCG/ACT inhaler Inhale 2 puffs into the lungs once every 4 to 6 hours as needed.   alendronate (FOSAMAX) 70 MG tablet TAKE 1 TABLET BY MOUTH ONCE A WEEK ON SUNDAYS.   apixaban (ELIQUIS) 5 MG TABS tablet Take 1 tablet (5 mg total) by mouth 2 (two) times daily.   esomeprazole (NEXIUM) 40 MG capsule Take 40 mg by mouth daily at 12 noon.   fenofibrate (TRICOR) 48 MG tablet TAKE 1 TABLET BY MOUTH ONCE A DAY   fluticasone (FLONASE) 50 MCG/ACT  nasal spray Place 2 sprays into both nostrils daily.   linaclotide (LINZESS) 145 MCG CAPS capsule TAKE 1 CAPSULE BY MOUTH ONCE DAILY FOR CONSTIPATION.   metoprolol tartrate (LOPRESSOR) 25 MG tablet TAKE 1 TABLET(25 MG) BY MOUTH TWICE DAILY   Spacer/Aero-Holding Chambers (AEROCHAMBER MV) inhaler Use as instructed     Allergies:   Patient has no known allergies.   Social History   Socioeconomic History   Marital status: Single    Spouse name: Not on file   Number of children: Not on file   Years of education: Not on file   Highest education level: Not on file  Occupational History   Not on file  Tobacco Use   Smoking status: Former     Packs/day: 1.00    Years: 30.00    Total pack years: 30.00    Types: Cigarettes    Quit date: 12/26/2014    Years since quitting: 7.5   Smokeless tobacco: Never  Vaping Use   Vaping Use: Never used  Substance and Sexual Activity   Alcohol use: Yes    Comment: 1 drink /2 weeks   Drug use: No   Sexual activity: Not on file  Other Topics Concern   Not on file  Social History Narrative   Not on file   Social Determinants of Health   Financial Resource Strain: Not on file  Food Insecurity: Not on file  Transportation Needs: Not on file  Physical Activity: Not on file  Stress: Not on file  Social Connections: Not on file     Family History: The patient's family history includes CAD in her brother; COPD in her mother; Diabetes in her father; Heart disease in her father.  ROS:   Please see the history of present illness.     All other systems reviewed and are negative.  EKGs/Labs/Other Studies Reviewed:    The following studies were reviewed today:  Coronary CTA 01/2022 1. Coronary calcium score of 0. Patient is low risk for coronary events.   2. Normal coronary origin with right dominance.   3. No evidence of CAD.   4. CAD-RADS 0. Consider non-atherosclerotic causes of chest pain.   Electronically Signed: By: Kate Sable M.D. On: 02/25/2022 15:38   Heart monitor 01/2021 Patient had a min HR of 56 bpm, max HR of 194 bpm, and avg HR of 80 bpm. Predominant underlying rhythm was Sinus Rhythm. 11 Supraventricular Tachycardia runs occurred, the run with the fastest interval lasting 8 beats with a max rate of 194 bpm, the  longest lasting 8 beats with an avg rate of 126 bpm. Atrial Fibrillation occurred (<1% burden), ranging from 80-150 bpm (avg of 104 bpm), the longest lasting 2 hours 40 mins with an avg rate of 104 bpm. Isolated SVEs were rare (<1.0%), SVE Couplets were  rare (<1.0%), and SVE Triplets were rare (<1.0%). Isolated VEs were rare (<1.0%), VE Couplets  were rare (<1.0%), and no VE Triplets were present. Ventricular Bigeminy was present. Previously notified for MD notification criteria for First Documentation  of Atrial Fibrillation met - Notified Dr. Gwynneth Aliment on 11 Feb 2022 at 2:33 AM ET [facility]).   Echo 12/2021 1. Left ventricular ejection fraction, by estimation, is 55 to 60%. The  left ventricle has normal function. The left ventricle has no regional  wall motion abnormalities. Left ventricular diastolic parameters were  normal.   2. Right ventricular systolic function is normal. The right ventricular  size is normal. Tricuspid regurgitation signal is  inadequate for assessing  PA pressure.   3. The mitral valve is normal in structure. No evidence of mitral valve  regurgitation. No evidence of mitral stenosis.   4. The aortic valve is normal in structure. Aortic valve regurgitation is  not visualized. No aortic stenosis is present.   5. The inferior vena cava is normal in size with greater than 50%  respiratory variability, suggesting right atrial pressure of 3 mmHg.   EKG:  EKG is ordered today.  The ekg ordered today demonstrates NSR 86bpm, no ST/T wave changes  Recent Labs: 10/19/2021: ALT 27 01/25/2022: Magnesium 2.0 01/26/2022: BUN 7; Creatinine, Ser 0.47; Potassium 3.5; Sodium 140 03/04/2022: Hemoglobin 12.8; Platelets 220  Recent Lipid Panel    Component Value Date/Time   CHOL 218 (H) 03/04/2022 0913   TRIG 135 03/04/2022 0913   HDL 67 03/04/2022 0913   CHOLHDL 3.3 03/04/2022 0913   VLDL 27 03/04/2022 0913   LDLCALC 124 (H) 03/04/2022 0913    Physical Exam:    VS:  BP (!) 148/80 (BP Location: Left Arm, Patient Position: Sitting, Cuff Size: Normal)   Pulse 86   Ht '5\' 6"'$  (1.676 m)   Wt 127 lb 3.2 oz (57.7 kg)   SpO2 100%   BMI 20.53 kg/m     Wt Readings from Last 3 Encounters:  07/05/22 127 lb 3.2 oz (57.7 kg)  04/16/22 119 lb 12.8 oz (54.3 kg)  03/04/22 119 lb (54 kg)     GEN:  Well nourished, well developed in no  acute distress HEENT: Normal NECK: No JVD; No carotid bruits LYMPHATICS: No lymphadenopathy CARDIAC: RRR, no murmurs, rubs, gallops RESPIRATORY:  Clear to auscultation without rales, wheezing or rhonchi  ABDOMEN: Soft, non-tender, non-distended MUSCULOSKELETAL:  No edema; No deformity  SKIN: Warm and dry NEUROLOGIC:  Alert and oriented x 3 PSYCHIATRIC:  Normal affect   ASSESSMENT:    1. Swelling of left lower extremity   2. Shortness of breath   3. Paroxysmal A-fib ([place])   4. Chronic obstructive pulmonary disease, unspecified COPD type ([place])   5. Hyperlipidemia, mixed    PLAN:    In order of problems listed above:  Unilateral lower leg swelling Patient had significant left lower leg edema 4 days ago. She did drink a lot of water that day. She reported numbness and tingling as well, but no pain. She did notice some shortness of breath. Today, leg appear back to baseline. She is not on a diuretic. Patient is taking a blood thinner daily. Low suspicion for DVT, but we will order US DVT study. I will also check BNP, CBC and BMET. Prior echo showed normal LVEF and normal diastolic function.  Paroxysmal Afib The patient is in NSR with a heart rate of 86bpm. She is taking Eliquis '5mg'$  BID, she denies missing doses. Continue Lopressor for rate control.   COPD Patient reports intermittent shortness of breath.   HLD Most recent LDL 124. The patient is working on lifestyle changes.  Disposition: Follow up in 1 month(s) with MD/APP     Signed, Lateisha Thurlow Ninfa Meeker, PA-C  07/05/2022 11:32 AM    [facility]

## 2022-07-05 NOTE — Patient Instructions (Signed)
Medication Instructions:  No changes at this time  *If you need a refill on your cardiac medications before your next appointment, please call your pharmacy*   Lab Work: CBC, BMET, and BNP to be done over at the [facility] entrance and stop at registration to check in.   If you have labs (blood work) drawn today and your tests are completely normal, you will receive your results only by: [place] (if you have MyChart) OR A paper copy in the mail If you have any lab test that is abnormal or we need to change your treatment, we will call you to review the results.   Testing/Procedures: Your physician has requested that you have a lower or upper extremity venous duplex. This test is an ultrasound of the veins in the legs or arms. It looks at venous blood flow that carries blood from the heart to the legs or arms. Allow one hour for a Lower Venous exam. Allow thirty minutes for an Upper Venous exam. There are no restrictions or special instructions.     Follow-Up: At [facility], you and your health needs are our priority.  As part of our continuing mission to provide you with exceptional heart care, we have created designated Provider Care Teams.  These Care Teams include your primary Cardiologist (physician) and Advanced Practice Providers (APPs -  Physician Assistants and Nurse Practitioners) who all work together to provide you with the care you need, when you need it.   Your next appointment:   1 month(s)  The format for your next appointment:   In Person  Provider:   Glenetta Hew, MD or Cadence Kathlen Mody, [place]        Important Information About Sugar

## 2022-07-13 ENCOUNTER — Ambulatory Visit: Payer: Self-pay | Attending: Medical

## 2022-07-23 ENCOUNTER — Other Ambulatory Visit: Payer: Self-pay

## 2022-07-27 ENCOUNTER — Other Ambulatory Visit: Payer: Self-pay

## 2022-07-27 ENCOUNTER — Telehealth: Payer: Self-pay | Admitting: Cardiology

## 2022-07-27 ENCOUNTER — Ambulatory Visit: Payer: PRIVATE HEALTH INSURANCE | Attending: Medical

## 2022-07-27 DIAGNOSIS — M7989 Other specified soft tissue disorders: Secondary | ICD-10-CM

## 2022-07-27 MED ORDER — APIXABAN 5 MG PO TABS: 5.0000 mg | ORAL_TABLET | Freq: Two times a day (BID) | ORAL | 5 refills | Status: DC

## 2022-07-27 NOTE — Telephone Encounter (Signed)
Please see below.

## 2022-07-27 NOTE — Telephone Encounter (Signed)
Patient came by office States that she just received new insurance and needs her Eliquis prescription changed It is for [facility] Ph [phone] Fax [phone]  There was also another fax number listed [phone] Patient not clear on which

## 2022-07-27 NOTE — Telephone Encounter (Signed)
Prescription refill request for Eliquis received. Indication: PAF Last office visit: 07/05/22  Read Drivers PA-C Scr: 0.71 on 07/05/22  Epic Age: 62 Weight: 57.7kg  Based on above findings Eliquis '5mg'$  twice daily is the appropriate dose.  Refill approved.

## 2022-08-05 ENCOUNTER — Ambulatory Visit: Payer: PRIVATE HEALTH INSURANCE | Attending: Medical | Admitting: Nurse Practitioner

## 2022-08-05 ENCOUNTER — Encounter: Payer: Self-pay | Admitting: Nurse Practitioner

## 2022-08-05 VITALS — BP 120/73 | HR 75 | Ht 65.0 in | Wt 129.2 lb

## 2022-08-05 DIAGNOSIS — I10 Essential (primary) hypertension: Secondary | ICD-10-CM

## 2022-08-05 DIAGNOSIS — J449 Chronic obstructive pulmonary disease, unspecified: Secondary | ICD-10-CM

## 2022-08-05 DIAGNOSIS — I480 Paroxysmal atrial fibrillation: Secondary | ICD-10-CM | POA: Diagnosis not present

## 2022-08-05 MED ORDER — METOPROLOL TARTRATE 25 MG PO TABS: ORAL_TABLET | ORAL | 3 refills | Status: DC

## 2022-08-05 NOTE — Patient Instructions (Signed)
Medication Instructions:  No changes *If you need a refill on your cardiac medications before your next appointment, please call your pharmacy*   Lab Work: None If you have labs (blood work) drawn today and your tests are completely normal, you will receive your results only by: [place] (if you have MyChart) OR A paper copy in the mail If you have any lab test that is abnormal or we need to change your treatment, we will call you to review the results.   Testing/Procedures: None   Follow-Up: At [facility], you and your health needs are our priority.  As part of our continuing mission to provide you with exceptional heart care, we have created designated Provider Care Teams.  These Care Teams include your primary Cardiologist (physician) and Advanced Practice Providers (APPs -  Physician Assistants and Nurse Practitioners) who all work together to provide you with the care you need, when you need it.  We recommend signing up for the patient portal called "MyChart".  Sign up information is provided on this After Visit Summary.  MyChart is used to connect with patients for Virtual Visits (Telemedicine).  Patients are able to view lab/test results, encounter notes, upcoming appointments, etc.  Non-urgent messages can be sent to your provider as well.   To learn more about what you can do with MyChart, go to [email or web address].    Your next appointment:   6 month(s)  Provider:   Glenetta Hew, MD or Murray Hodgkins, NP    Other Instructions

## 2022-08-05 NOTE — Progress Notes (Signed)
Office Visit    Patient Name: Cait Locust Date of Encounter: 08/05/2022  Primary Care Provider:  Rutherford Limerick, PA Primary Cardiologist:  Glenetta Hew, MD  Chief Complaint    62 year old female with a history of paroxysmal atrial fibrillation on Eliquis, hypertension, hyperlipidemia, COPD, and prior tobacco abuse, who presents for A-fib follow-up.  Past Medical History    Past Medical History:  Diagnosis Date   Alcohol use    Arthritis    hands, wrist   Asthma    Chest pain    a. 01/2022 Cor CTA: Ca2+ = 0. Nl cors.   COPD (chronic obstructive pulmonary disease) (HCC)    Emphysema of lung (HCC)    GERD (gastroesophageal reflux disease)    Eosinophilic esophagitis 5027   History of echocardiogram    a. 12/2021 Echo: EF 55-60%, no rwma, nl RV fxn.   Osteoporosis    PAF (paroxysmal atrial fibrillation) ([place])    a. 01/2022 Zio: Predominantly sinus rhythm, average 80 (56-104).  Less than 1% A-fib burden (80-150 bpm).  Isolated PVC/PACs. 11 brief atrial runs-->Eliquis 5 bid (CHA2DS2VASc= 2).   Wears dentures    full upper and lower   Past Surgical History:  Procedure Laterality Date   CESAREAN SECTION     COLONOSCOPY WITH PROPOFOL N/A 01/08/2016   Procedure: COLONOSCOPY WITH PROPOFOL;  Surgeon: Lucilla Lame, MD;  Location: [place];  Service: Endoscopy;  Laterality: N/A;   ESOPHAGOGASTRODUODENOSCOPY (EGD) WITH PROPOFOL N/A 01/08/2016   Procedure: ESOPHAGOGASTRODUODENOSCOPY (EGD) WITH PROPOFOL;  Surgeon: Lucilla Lame, MD;  Location: [place];  Service: Endoscopy;  Laterality: N/A;   MULTIPLE TOOTH EXTRACTIONS     POLYPECTOMY  01/08/2016   Procedure: POLYPECTOMY;  Surgeon: Lucilla Lame, MD;  Location: [place];  Service: Endoscopy;;    Allergies  No Known Allergies  History of Present Illness    62 year old female with the above past medical history including paroxysmal atrial fibrillation, hypertension, hyperlipidemia, COPD, and prior  tobacco abuse.  In June 2023, she was hospitalized with syncope and chest tightness.  Echo showed normal LV function without significant valvular abnormalities.  Event monitoring in August 2023 showed predominantly sinus rhythm with a less than 1% A-fib burden.  In the setting of a CHA2DS2-VASc of 3, she was placed on Eliquis.  Due to chest tightness, coronary CT angiogram was performed in August 2023 showing a calcium score of 0 and normal coronary arteries.  In September 2023, bisoprolol HCTZ was discontinued in the setting of lightheadedness and she was switched to metoprolol alone.  Symptoms were improved by October follow-up.  Ms. Geffert was last seen in cardiology clinic approximately 1 month ago, at which time she reported unilateral left leg swelling occurring 4 days prior with associated numbness and tingling.  She was compliant with Eliquis.  Lower extremity DVT study was negative.  Labs were unremarkable.  Since then, she has done well.  She has noted occasional brief flutters in her chest but no sustained palpitations or arrhythmias.  She denies chest pain, dyspnea, PND, orthopnea, dizziness, syncope, edema, or early satiety.  Home Medications    Current Outpatient Medications  Medication Sig Dispense Refill   albuterol (PROVENTIL HFA) 108 (90 Base) MCG/ACT inhaler Inhale 2 puffs into the lungs once every 4 to 6 hours as needed. 6.7 g 2   alendronate (FOSAMAX) 70 MG tablet TAKE 1 TABLET BY MOUTH ONCE A WEEK ON SUNDAYS. 4 tablet 5   apixaban (ELIQUIS) 5 MG TABS  tablet Take 1 tablet (5 mg total) by mouth 2 (two) times daily. 60 tablet 5   esomeprazole (NEXIUM) 40 MG capsule Take 40 mg by mouth daily at 12 noon.     fenofibrate (TRICOR) 48 MG tablet TAKE 1 TABLET BY MOUTH ONCE A DAY 90 tablet 2   fluticasone (FLONASE) 50 MCG/ACT nasal spray Place 2 sprays into both nostrils daily. 16 g 0   linaclotide (LINZESS) 145 MCG CAPS capsule TAKE 1 CAPSULE BY MOUTH ONCE DAILY FOR CONSTIPATION. 90 capsule  2   Spacer/Aero-Holding Chambers (AEROCHAMBER MV) inhaler Use as instructed 1 each 1   apixaban (ELIQUIS) 5 MG TABS tablet Take one tablet by mouth twice a day (Patient not taking: Reported on 04/16/2022) 180 tablet 3   metoprolol tartrate (LOPRESSOR) 25 MG tablet TAKE 1 TABLET(25 MG) BY MOUTH TWICE DAILY 180 tablet 3   No current facility-administered medications for this visit.     Review of Systems    Occasional brief flutters but no sustained palpitations or arrhythmias.  She denies chest pain, dyspnea, PND, orthopnea, dizziness, syncope, edema, or early satiety.  All other systems reviewed and are otherwise negative except as noted above.    Physical Exam    VS:  BP 120/73 (BP Location: Left Arm, Patient Position: Sitting, Cuff Size: Normal)   Pulse 75   Ht '5\' 5"'$  (1.651 m)   Wt 129 lb 3.2 oz (58.6 kg)   SpO2 98%   BMI 21.50 kg/m  , BMI Body mass index is 21.5 kg/m.     GEN: Well nourished, well developed, in no acute distress. HEENT: normal. Neck: Supple, no JVD, carotid bruits, or masses. Cardiac: RRR, no murmurs, rubs, or gallops. No clubbing, cyanosis, edema.  Radials 2+/PT 2+ and equal bilaterally.  Respiratory:  Respirations regular and unlabored, diminished breath sounds bilaterally.  GI: Soft, nontender, nondistended, BS + x 4. MS: no deformity or atrophy. Skin: warm and dry, no rash. Neuro:  Strength and sensation are intact. Psych: Normal affect.  Accessory Clinical Findings    ECG personally reviewed by me today -regular sinus rhythm, 75- no acute changes.  Lab Results  Component Value Date   WBC 5.8 07/05/2022   HGB 13.1 07/05/2022   HCT 38.4 07/05/2022   MCV 89.9 07/05/2022   PLT 234 07/05/2022   Lab Results  Component Value Date   CREATININE 0.71 07/05/2022   BUN 16 07/05/2022   NA 139 07/05/2022   K 4.5 07/05/2022   CL 104 07/05/2022   CO2 27 07/05/2022   Lab Results  Component Value Date   ALT 27 10/19/2021   AST 34 10/19/2021   GGT 91  (H) 10/19/2021   ALKPHOS 140 (H) 10/19/2021   BILITOT 0.5 10/19/2021   Lab Results  Component Value Date   CHOL 218 (H) 03/04/2022   HDL 67 03/04/2022   LDLCALC 124 (H) 03/04/2022   TRIG 135 03/04/2022   CHOLHDL 3.3 03/04/2022     Assessment & Plan    1.  Paroxysmal atrial fibrillation: Discovered on monitoring over the summer 2023.  Maintaining sinus rhythm.  Rare and brief flutters in her chest but no sustained palpitations or arrhythmias.  She remains on beta-blocker and Eliquis therapy (CHA2DS2-VASc equals 2).  Normal CBC and basic metabolic panel in January.  2.  Essential hypertension: Stable on beta-blocker therapy.  No recurrent lightheadedness since changing from bisoprolol-HCTZ to metoprolol.  3.  COPD: Quit smoking 8 years ago.  Denies dyspnea.  No active  wheezing on examination.  4.  Hyperlipidemia: LDL 124 in September 2023.  Normal coronary CT angiogram with calcium score of 0.  10-year risk of cardiovascular event calculates to 7%.  Continue lifestyle modifications.  5.  Disposition: Follow-up in 6 months or sooner if necessary.   Murray Hodgkins, NP 08/05/2022, 3:16 PM

## 2022-08-16 ENCOUNTER — Other Ambulatory Visit: Payer: Self-pay

## 2022-08-17 ENCOUNTER — Ambulatory Visit: Payer: Self-pay | Admitting: Medical

## 2022-08-19 ENCOUNTER — Other Ambulatory Visit: Payer: Self-pay

## 2022-08-23 ENCOUNTER — Other Ambulatory Visit: Payer: Self-pay

## 2022-08-23 ENCOUNTER — Telehealth: Payer: Self-pay | Admitting: Gastroenterology

## 2022-08-23 MED ORDER — LINACLOTIDE 145 MCG PO CAPS: ORAL_CAPSULE | Freq: Every day | ORAL | 2 refills | Status: DC

## 2022-08-23 NOTE — Addendum Note (Signed)
Addended by: Lurlean Nanny on: 08/23/2022 09:12 AM   Modules accepted: Orders

## 2022-08-23 NOTE — Telephone Encounter (Signed)
Patient calling to get a refill on Linzess 145. Would like prescription sent to [facility] in [place].

## 2022-08-24 ENCOUNTER — Other Ambulatory Visit: Payer: Self-pay

## 2022-08-24 MED ORDER — LINACLOTIDE 145 MCG PO CAPS: 145.0000 ug | ORAL_CAPSULE | Freq: Every day | ORAL | 3 refills | Status: DC

## 2022-09-14 ENCOUNTER — Other Ambulatory Visit: Payer: Self-pay | Admitting: Medical

## 2022-09-28 ENCOUNTER — Encounter: Payer: Self-pay | Admitting: Gastroenterology

## 2022-09-28 ENCOUNTER — Ambulatory Visit (INDEPENDENT_AMBULATORY_CARE_PROVIDER_SITE_OTHER): Payer: PRIVATE HEALTH INSURANCE | Admitting: Gastroenterology

## 2022-09-28 VITALS — BP 151/78 | HR 67 | Temp 98.4°F | Wt 130.0 lb

## 2022-09-28 DIAGNOSIS — R7989 Other specified abnormal findings of blood chemistry: Secondary | ICD-10-CM | POA: Diagnosis not present

## 2022-09-28 NOTE — Progress Notes (Signed)
Primary Care Physician: Rutherford Limerick, PA  Primary Gastroenterologist:  Dr. Lucilla Lame  Chief Complaint  Patient presents with   Follow-up    Pt denies any changes or new complaints...    HPI: Jill King is a 62 y.o. female here for follow-up with a history of chronic constipation being treated with Linzess with good relief.  The patient has also had an upper endoscopy in the past with increased eosinophils suggestive of eosinophilic esophagitis.  The patient has had a chronically elevated alkaline phosphatase with a GGT elevated at 91. The patient denies smoking or drinking and states that she has been doing pretty well with her Linzess.  There is no new issues.  Past Medical History:  Diagnosis Date   Alcohol use    Arthritis    hands, wrist   Asthma    Chest pain    a. 01/2022 Cor CTA: Ca2+ = 0. Nl cors.   COPD (chronic obstructive pulmonary disease)    Emphysema of lung    GERD (gastroesophageal reflux disease)    Eosinophilic esophagitis 0000000   History of echocardiogram    a. 12/2021 Echo: EF 55-60%, no rwma, nl RV fxn.   Osteoporosis    PAF (paroxysmal atrial fibrillation)    a. 01/2022 Zio: Predominantly sinus rhythm, average 80 (56-104).  Less than 1% A-fib burden (80-150 bpm).  Isolated PVC/PACs. 11 brief atrial runs-->Eliquis 5 bid (CHA2DS2VASc= 2).   Wears dentures    full upper and lower    Current Outpatient Medications  Medication Sig Dispense Refill   albuterol (PROVENTIL HFA) 108 (90 Base) MCG/ACT inhaler Inhale 2 puffs into the lungs once every 4 to 6 hours as needed. 6.7 g 2   alendronate (FOSAMAX) 70 MG tablet TAKE 1 TABLET BY MOUTH ONCE A WEEK ON SUNDAYS. 4 tablet 5   apixaban (ELIQUIS) 5 MG TABS tablet Take one tablet by mouth twice a day 180 tablet 3   apixaban (ELIQUIS) 5 MG TABS tablet Take 1 tablet (5 mg total) by mouth 2 (two) times daily. 60 tablet 5   esomeprazole (NEXIUM) 40 MG capsule Take 40 mg by mouth daily at 12 noon.     fenofibrate  (TRICOR) 48 MG tablet TAKE 1 TABLET BY MOUTH ONCE A DAY 90 tablet 2   fluticasone (FLONASE) 50 MCG/ACT nasal spray Place 2 sprays into both nostrils daily. 16 g 0   linaclotide (LINZESS) 145 MCG CAPS capsule TAKE 1 CAPSULE BY MOUTH ONCE DAILY FOR CONSTIPATION. 90 capsule 2   metoprolol tartrate (LOPRESSOR) 25 MG tablet TAKE 1 TABLET(25 MG) BY MOUTH TWICE DAILY 180 tablet 3   Spacer/Aero-Holding Chambers (AEROCHAMBER MV) inhaler Use as instructed 1 each 1   No current facility-administered medications for this visit.    Allergies as of 09/28/2022   (No Known Allergies)    ROS:  General: Negative for anorexia, weight loss, fever, chills, fatigue, weakness. ENT: Negative for hoarseness, difficulty swallowing , nasal congestion. CV: Negative for chest pain, angina, palpitations, dyspnea on exertion, peripheral edema.  Respiratory: Negative for dyspnea at rest, dyspnea on exertion, cough, sputum, wheezing.  GI: See history of present illness. GU:  Negative for dysuria, hematuria, urinary incontinence, urinary frequency, nocturnal urination.  Endo: Negative for unusual weight change.    Physical Examination:   BP (!) 151/78 (BP Location: Right Arm, Patient Position: Sitting, Cuff Size: Normal)   Pulse 67   Temp 98.4 F (36.9 C) (Oral)   Wt 130 lb (59 kg)  BMI 21.63 kg/m   General: Well-nourished, well-developed in no acute distress.  Eyes: No icterus. Conjunctivae pink. Lungs: Clear to auscultation bilaterally. Non-labored. Heart: Regular rate and rhythm, no murmurs rubs or gallops.  Abdomen: Bowel sounds are normal, nontender, nondistended, no hepatosplenomegaly or masses, no abdominal bruits or hernia , no rebound or guarding.   Extremities: No lower extremity edema. No clubbing or deformities. Neuro: Alert and oriented x 3.  Grossly intact. Skin: Warm and dry, no jaundice.   Psych: Alert and cooperative, normal mood and affect.  Labs:    Imaging Studies: No results  found.  Assessment and Plan:   Jill King is a 63 y.o. y/o female who comes in today with a history of elevated alkaline phosphatase.  The patient also has a history of chronic idiopathic constipation and will have her Linzess refilled.  The patient dysphagia is under control and she has no further dysphagia.  The patient will be set up for a blood test for alkaline phosphatase fractionation and LFTs.  The has been explained the plan and will have her Linzess refilled and she will be notified with the lab results when they are back.     Lucilla Lame, MD. [name]    Note: This dictation was prepared with Dragon dictation along with smaller phrase technology. Any transcriptional errors that result from this process are unintentional.

## 2022-10-03 LAB — HEPATIC FUNCTION PANEL
ALT: 15 IU/L (ref 0–32)
AST: 19 IU/L (ref 0–40)
Albumin: 4.5 g/dL (ref 3.8–4.9)
Alkaline Phosphatase: 121 IU/L (ref 44–121)
Bilirubin Total: 0.2 mg/dL (ref 0.0–1.2)
Bilirubin, Direct: <0.1 mg/dL (ref 0.00–0.40)
Total Protein: 6.7 g/dL (ref 6.0–8.5)

## 2022-10-03 LAB — ANTI-SMOOTH MUSCLE ANTIBODY, IGG: Smooth Muscle Ab: 3 Units (ref 0–19)

## 2022-10-03 LAB — SPECIMEN STATUS REPORT

## 2022-10-03 LAB — ALKALINE PHOSPHATASE, ISOENZYMES
BONE FRACTION: 25 % (ref 14–68)
INTESTINAL FRAC.: 19 % — ABNORMAL HIGH (ref 0–18)
LIVER FRACTION: 56 % (ref 18–85)

## 2022-10-03 LAB — MITOCHONDRIAL ANTIBODIES: Mitochondrial Ab: <20 Units (ref 0.0–20.0)

## 2022-10-06 ENCOUNTER — Encounter: Payer: Self-pay | Admitting: Gastroenterology

## 2022-11-05 ENCOUNTER — Other Ambulatory Visit: Payer: Self-pay

## 2022-12-09 NOTE — Telephone Encounter (Signed)
Error

## 2022-12-27 ENCOUNTER — Telehealth: Payer: Self-pay | Admitting: Medical

## 2022-12-27 ENCOUNTER — Telehealth: Payer: Self-pay | Admitting: *Deleted

## 2022-12-27 NOTE — Telephone Encounter (Signed)
I s/w the pt and she has been scheduled for tele pre op appt 01/07/23 @ 2:20. Med rec and consent are done.      Patient Consent for Virtual Visit        Jill King has provided verbal consent on 12/27/2022 for a virtual visit (video or telephone).   CONSENT FOR VIRTUAL VISIT FOR:  Jill King  By participating in this virtual visit I agree to the following:  I hereby voluntarily request, consent and authorize [facility] and its employed or contracted physicians, physician assistants, nurse practitioners or other licensed health care professionals (the Practitioner), to provide me with telemedicine health care services (the "Services") as deemed necessary by the treating Practitioner. I acknowledge and consent to receive the Services by the Practitioner via telemedicine. I understand that the telemedicine visit will involve communicating with the Practitioner through live audiovisual communication technology and the disclosure of certain medical information by electronic transmission. I acknowledge that I have been given the opportunity to request an in-person assessment or other available alternative prior to the telemedicine visit and am voluntarily participating in the telemedicine visit.  I understand that I have the right to withhold or withdraw my consent to the use of telemedicine in the course of my care at any time, without affecting my right to future care or treatment, and that the Practitioner or I may terminate the telemedicine visit at any time. I understand that I have the right to inspect all information obtained and/or recorded in the course of the telemedicine visit and may receive copies of available information for a reasonable fee.  I understand that some of the potential risks of receiving the Services via telemedicine include:  Delay or interruption in medical evaluation due to technological equipment failure or disruption; Information transmitted may not be  sufficient (e.g. poor resolution of images) to allow for appropriate medical decision making by the Practitioner; and/or  In rare instances, security protocols could fail, causing a breach of personal health information.  Furthermore, I acknowledge that it is my responsibility to provide information about my medical history, conditions and care that is complete and accurate to the best of my ability. I acknowledge that Practitioner's advice, recommendations, and/or decision may be based on factors not within their control, such as incomplete or inaccurate data provided by me or distortions of diagnostic images or specimens that may result from electronic transmissions. I understand that the practice of medicine is not an exact science and that Practitioner makes no warranties or guarantees regarding treatment outcomes. I acknowledge that a copy of this consent can be made available to me via my patient portal [facility] MyChart), or I can request a printed copy by calling the office of [facility].    I understand that my insurance will be billed for this visit.   I have read or had this consent read to me. I understand the contents of this consent, which adequately explains the benefits and risks of the Services being provided via telemedicine.  I have been provided ample opportunity to ask questions regarding this consent and the Services and have had my questions answered to my satisfaction. I give my informed consent for the services to be provided through the use of telemedicine in my medical care

## 2022-12-27 NOTE — Telephone Encounter (Signed)
I s/w the pt and she has been scheduled for tele pre op appt 01/07/23 @ 2:20. Med rec and consent are done.

## 2022-12-27 NOTE — Telephone Encounter (Signed)
Pharmacy please advise on holding Eliquis prior to right metacarpal phalangeal joint arthroplasty scheduled for 01/18/2023. Thank you.

## 2022-12-27 NOTE — Telephone Encounter (Signed)
   Name: Jill King  DOB: Apr 04, 1961  MRN: [id number]  Primary Cardiologist: Bryan Lemma, MD   Preoperative team, please contact this patient and set up a phone call appointment for further preoperative risk assessment. Please obtain consent and complete medication review. Thank you for your help.  I confirm that guidance regarding antiplatelet and oral anticoagulation therapy has been completed and, if necessary, noted below.  Per office protocol, patient can hold Eliquis for 2 days prior to procedure.     Napoleon Form, Leodis Rains, NP 12/27/2022, 2:46 PM [facility]

## 2022-12-27 NOTE — Telephone Encounter (Signed)
Patient with diagnosis of afib on Eliquis for anticoagulation.    Procedure: Right Middle finger Metacarpophalangeal Joint Arthoplasty, Right index finger and index finger denervation   Date of procedure: 01/18/23  CHA2DS2-VASc Score = 2  This indicates a 2.2% annual risk of stroke. The patient's score is based upon: CHF History: 0 HTN History: 1 Diabetes History: 0 Stroke History: 0 Vascular Disease History: 0 Age Score: 0 Gender Score: 1   CrCl 61mL/min Platelet count 234K  Per office protocol, patient can hold Eliquis for 2 days prior to procedure.    **This guidance is not considered finalized until pre-operative APP has relayed final recommendations.**

## 2022-12-27 NOTE — Telephone Encounter (Signed)
   Pre-operative Risk Assessment    Patient Name: Jill King  DOB: 12-13-1960 MRN: [id number]      Request for Surgical Clearance    Procedure:  Right Middle finger Metacarpophalangeal Joint Arthoplasty, Right index finger and index finger denervation  Date of Surgery:  Clearance 01/18/23                                 Surgeon:  Virl Son, Floreen Comber Surgeon's Group or Practice Name:  [facility] Phone number:  [phone] Fax number:  [phone]   Type of Clearance Requested:  Medical, Pharmacy Eliquis   Type of Anesthesia:  MAC   Additional requests/questions:    Signed, Narda Amber   12/27/2022, 1:40 PM

## 2023-01-07 ENCOUNTER — Telehealth: Payer: Self-pay | Admitting: Pharmacist

## 2023-01-07 ENCOUNTER — Ambulatory Visit: Payer: PRIVATE HEALTH INSURANCE | Attending: Internal Medicine

## 2023-01-07 ENCOUNTER — Encounter: Payer: Self-pay | Admitting: Pharmacist

## 2023-01-07 DIAGNOSIS — Z01810 Encounter for preprocedural cardiovascular examination: Secondary | ICD-10-CM | POA: Diagnosis not present

## 2023-01-07 MED ORDER — APIXABAN 5 MG PO TABS: 5.0000 mg | ORAL_TABLET | Freq: Two times a day (BID) | ORAL | 1 refills | Status: DC

## 2023-01-07 NOTE — Progress Notes (Signed)
Virtual Visit via Telephone Note   Because of Jill King's co-morbid illnesses, she is at least at moderate risk for complications without adequate follow up.  This format is felt to be most appropriate for this patient at this time.  The patient did not have access to video technology/had technical difficulties with video requiring transitioning to audio format only (telephone).  All issues noted in this document were discussed and addressed.  No physical exam could be performed with this format.  Please refer to the patient's chart for her consent to telehealth for [facility]  Evaluation Performed:  Preoperative cardiovascular risk assessment _____________   Date:  01/07/2023   Patient ID:  Jill King, DOB 1961/02/20, MRN [id number] Patient Location:  Home Provider location:   Office  Primary Care Provider:  Gildardo Pounds, PA Primary Cardiologist:  Bryan Lemma, MD  Chief Complaint / Patient Profile   62 y.o. y/o female with a h/o COPD, prior tobacco abuse, HTN, HLD, PAF who is pending right middle finger metacarpophalangeal joint arthroplasty, right index finger and index finger denervation and presents today for telephonic preoperative cardiovascular risk assessment.  History of Present Illness    Jill King is a 62 y.o. female who presents via audio/video conferencing for a telehealth visit today.  Pt was last seen in cardiology clinic on 08/05/2022 by Maxine Glenn, NP.  At that time Jill King was doing well .  The patient is now pending procedure as outlined above. Since her last visit, she tells me that her medications have been stabilizing the atrial fibrillation and her blood pressure has been stable with some recent medication changes.  No issues with palpitations, chest pain, or shortness of breath.  She does meet 4 METS on the DASI.  Per office protocol, patient can hold Eliquis for 2 days prior to procedure.   Past Medical History    Past Medical  History:  Diagnosis Date   Alcohol use    Arthritis    hands, wrist   Asthma    Chest pain    a. 01/2022 Cor CTA: Ca2+ = 0. Nl cors.   COPD (chronic obstructive pulmonary disease) (HCC)    Emphysema of lung (HCC)    GERD (gastroesophageal reflux disease)    Eosinophilic esophagitis 2017   History of echocardiogram    a. 12/2021 Echo: EF 55-60%, no rwma, nl RV fxn.   Osteoporosis    PAF (paroxysmal atrial fibrillation) (HCC)    a. 01/2022 Zio: Predominantly sinus rhythm, average 80 (56-104).  Less than 1% A-fib burden (80-150 bpm).  Isolated PVC/PACs. 11 brief atrial runs-->Eliquis 5 bid (CHA2DS2VASc= 2).   Wears dentures    full upper and lower   Past Surgical History:  Procedure Laterality Date   CESAREAN SECTION     COLONOSCOPY WITH PROPOFOL N/A 01/08/2016   Procedure: COLONOSCOPY WITH PROPOFOL;  Surgeon: Midge Minium, MD;  [facility];  Service: Endoscopy;  Laterality: N/A;   ESOPHAGOGASTRODUODENOSCOPY (EGD) WITH PROPOFOL N/A 01/08/2016   Procedure: ESOPHAGOGASTRODUODENOSCOPY (EGD) WITH PROPOFOL;  Surgeon: Midge Minium, MD;  Location: [facility];  Service: Endoscopy;  Laterality: N/A;   MULTIPLE TOOTH EXTRACTIONS     POLYPECTOMY  01/08/2016   Procedure: POLYPECTOMY;  Surgeon: Midge Minium, MD;  Location: [facility];  Service: Endoscopy;;    Allergies  No Known Allergies  Home Medications    Prior to Admission medications   Medication Sig Start Date End Date Taking? Authorizing Provider  albuterol (PROVENTIL  HFA) 108 (90 Base) MCG/ACT inhaler Inhale 2 puffs into the lungs once every 4 to 6 hours as needed. 01/26/22   Pennie Banter, DO  alendronate (FOSAMAX) 70 MG tablet TAKE 1 TABLET BY MOUTH ONCE A WEEK ON SUNDAYS. 10/06/21     apixaban (ELIQUIS) 5 MG TABS tablet Take one tablet by mouth twice a day 03/03/22     apixaban (ELIQUIS) 5 MG TABS tablet Take 1 tablet (5 mg total) by mouth 2 (two) times daily. 07/27/22   Marykay Lex, MD   esomeprazole (NEXIUM) 40 MG capsule Take 40 mg by mouth daily at 12 noon.    [provider]  fenofibrate (TRICOR) 48 MG tablet TAKE 1 TABLET BY MOUTH ONCE A DAY 10/08/21     fluticasone (FLONASE) 50 MCG/ACT nasal spray Place 2 sprays into both nostrils daily. 11/29/21   Domenick Gong, MD  linaclotide (LINZESS) 145 MCG CAPS capsule TAKE 1 CAPSULE BY MOUTH ONCE DAILY FOR CONSTIPATION. 08/23/22   Midge Minium, MD  metoprolol tartrate (LOPRESSOR) 25 MG tablet TAKE 1 TABLET(25 MG) BY MOUTH TWICE DAILY 09/14/22   Marykay Lex, MD  Spacer/Aero-Holding Chambers (AEROCHAMBER MV) inhaler Use as instructed 11/29/21   Domenick Gong, MD    Physical Exam    Vital Signs:  Jill King does not have vital signs available for review today.  Given telephonic nature of communication, physical exam is limited. AAOx3. NAD. Normal affect.  Speech and respirations are unlabored.  Accessory Clinical Findings    None  Assessment & Plan    1.  Preoperative Cardiovascular Risk Assessment:  [name]'s perioperative risk of a major cardiac event is 0.4% according to the Revised Cardiac Risk Index (RCRI).  Therefore, she is at low risk for perioperative complications.   Her functional capacity is good at 5.62 METs according to the [place] Activity Status Index (DASI). Recommendations: According to ACC/AHA guidelines, no further cardiovascular testing needed.  The patient may proceed to surgery at acceptable risk.   Antiplatelet and/or Anticoagulation Recommendations:  Eliquis (Apixaban) can be held for 2 days prior to surgery.  Please resume post op when felt to be safe.     The patient was advised that if she develops new symptoms prior to surgery to contact our office to arrange for a follow-up visit, and she verbalized understanding.  A copy of this note will be routed to requesting surgeon.  Time:   Today, I have spent 14 minutes with the patient with telehealth technology discussing medical  history, symptoms, and management plan.     Sharlene Dory, PA-C  01/07/2023, 2:26 PM

## 2023-01-07 NOTE — Telephone Encounter (Signed)
Pt had preop appt today and asked for Eliquis to be refilled to [facility]. She has Nurse, learning disability and can use the $10 copay card, looks like she was never set up with this. Rx sent to local pharmacy with copay card info, I sent pt mychart message with card info as well in case the pharmacy needs it again. She was appreciative for the assistance.

## 2023-01-10 ENCOUNTER — Other Ambulatory Visit: Payer: Self-pay

## 2023-01-12 ENCOUNTER — Other Ambulatory Visit: Payer: Self-pay

## 2023-01-18 ENCOUNTER — Other Ambulatory Visit: Payer: Self-pay

## 2023-01-18 MED ORDER — APIXABAN 5 MG PO TABS: 5.0000 mg | ORAL_TABLET | Freq: Two times a day (BID) | ORAL | 3 refills | Status: DC

## 2023-02-11 ENCOUNTER — Other Ambulatory Visit: Payer: Self-pay

## 2023-02-24 ENCOUNTER — Encounter: Payer: Self-pay | Admitting: Gastroenterology

## 2023-02-24 MED ORDER — LINACLOTIDE 290 MCG PO CAPS: 290.0000 ug | ORAL_CAPSULE | Freq: Every day | ORAL | 1 refills | Status: DC

## 2023-03-31 ENCOUNTER — Ambulatory Visit: Payer: PRIVATE HEALTH INSURANCE | Admitting: Nurse Practitioner

## 2023-04-01 ENCOUNTER — Encounter: Payer: Self-pay | Admitting: Nurse Practitioner

## 2023-04-01 ENCOUNTER — Ambulatory Visit: Payer: PRIVATE HEALTH INSURANCE | Attending: Nurse Practitioner | Admitting: Nurse Practitioner

## 2023-04-01 VITALS — BP 132/64 | HR 72 | Ht 63.0 in | Wt 131.0 lb

## 2023-04-01 DIAGNOSIS — E782 Mixed hyperlipidemia: Secondary | ICD-10-CM

## 2023-04-01 DIAGNOSIS — I480 Paroxysmal atrial fibrillation: Secondary | ICD-10-CM

## 2023-04-01 DIAGNOSIS — I10 Essential (primary) hypertension: Secondary | ICD-10-CM

## 2023-04-01 DIAGNOSIS — J449 Chronic obstructive pulmonary disease, unspecified: Secondary | ICD-10-CM

## 2023-04-01 NOTE — Patient Instructions (Signed)
Medication Instructions:  No changes *If you need a refill on your cardiac medications before your next appointment, please call your pharmacy*   Lab Work: None ordered If you have labs (blood work) drawn today and your tests are completely normal, you will receive your results only by: MyChart Message (if you have MyChart) OR A paper copy in the mail If you have any lab test that is abnormal or we need to change your treatment, we will call you to review the results.   Testing/Procedures: None ordered   Follow-Up: At [facility], you and your health needs are our priority.  As part of our continuing mission to provide you with exceptional heart care, we have created designated Provider Care Teams.  These Care Teams include your primary Cardiologist (physician) and Advanced Practice Providers (APPs -  Physician Assistants and Nurse Practitioners) who all work together to provide you with the care you need, when you need it.  We recommend signing up for the patient portal called "MyChart".  Sign up information is provided on this After Visit Summary.  MyChart is used to connect with patients for Virtual Visits (Telemedicine).  Patients are able to view lab/test results, encounter notes, upcoming appointments, etc.  Non-urgent messages can be sent to your provider as well.   To learn more about what you can do with MyChart, go to [email or web address].    Your next appointment:   6 month(s)  Provider:   You may see Bryan Lemma, MD or one of the following Advanced Practice Providers on your designated Care Team:   Nicolasa Ducking, NP

## 2023-04-01 NOTE — Progress Notes (Signed)
Office Visit    Patient Name: Jill King Date of Encounter: 04/01/2023  Primary Care Provider:  Gildardo Pounds, PA Primary Cardiologist:  Bryan Lemma, MD  Chief Complaint    62 y.o. female with a history of paroxysmal atrial fibrillation on Eliquis, hypertension, hyperlipidemia, COPD, and prior tobacco abuse, who presents for A-fib follow-up.   Past Medical History    Past Medical History:  Diagnosis Date   Alcohol use    Arthritis    hands, wrist   Asthma    Chest pain    a. 01/2022 Cor CTA: Ca2+ = 0. Nl cors.   COPD (chronic obstructive pulmonary disease) (HCC)    Emphysema of lung (HCC)    GERD (gastroesophageal reflux disease)    Eosinophilic esophagitis 2017   History of echocardiogram    a. 12/2021 Echo: EF 55-60%, no rwma, nl RV fxn.   Osteoporosis    PAF (paroxysmal atrial fibrillation) (HCC)    a. 01/2022 Zio: Predominantly sinus rhythm, average 80 (56-104).  Less than 1% A-fib burden (80-150 bpm).  Isolated PVC/PACs. 11 brief atrial runs-->Eliquis 5 bid (CHA2DS2VASc= 2).   Wears dentures    full upper and lower   Past Surgical History:  Procedure Laterality Date   CESAREAN SECTION     COLONOSCOPY WITH PROPOFOL N/A 01/08/2016   Procedure: COLONOSCOPY WITH PROPOFOL;  Surgeon: Midge Minium, MD;  Location: [facility];  Service: Endoscopy;  Laterality: N/A;   ESOPHAGOGASTRODUODENOSCOPY (EGD) WITH PROPOFOL N/A 01/08/2016   Procedure: ESOPHAGOGASTRODUODENOSCOPY (EGD) WITH PROPOFOL;  Surgeon: Midge Minium, MD;  Location: [facility];  Service: Endoscopy;  Laterality: N/A;   MULTIPLE TOOTH EXTRACTIONS     POLYPECTOMY  01/08/2016   Procedure: POLYPECTOMY;  Surgeon: Midge Minium, MD;  Location: [facility];  Service: Endoscopy;;    Allergies  No Known Allergies  History of Present Illness      62 y.o. y/o female with the above past medical history including paroxysmal atrial fibrillation, hypertension, hyperlipidemia, COPD, and prior  tobacco abuse. In June 2023, she was hospitalized with syncope and chest tightness. Echo showed normal LV function without significant valvular abnormalities. Event monitoring in August 2023 showed predominantly sinus rhythm with a less than 1% A-fib burden. In the setting of a CHA2DS2-VASc of 3, she was placed on Eliquis. Due to chest tightness, coronary CT angiogram was performed in August 2023 showing a calcium score of 0 and normal coronary arteries. In September 2023, bisoprolol HCTZ was discontinued in the setting of lightheadedness and she was switched to metoprolol alone.    Since her last visit in February 2024, Jill King has continued to do well.  Almost daily, she experiences a very brief fluttering sensation or palpitations, lasting just a second or 2, and resolving spontaneously.  She has not had any sustained arrhythmias or disruptions in activity tolerance.  She works full-time in Film/video editor.  She denies chest pain, dyspnea, PND, orthopnea, dizziness, syncope, edema, or early satiety.  Home Medications    Current Outpatient Medications  Medication Sig Dispense Refill   albuterol (PROVENTIL HFA) 108 (90 Base) MCG/ACT inhaler Inhale 2 puffs into the lungs once every 4 to 6 hours as needed. 6.7 g 2   alendronate (FOSAMAX) 70 MG tablet TAKE 1 TABLET BY MOUTH ONCE A WEEK ON SUNDAYS. 4 tablet 5   apixaban (ELIQUIS) 5 MG TABS tablet Take 1 tablet (5 mg total) by mouth 2 (two) times daily. 180 tablet 3   esomeprazole (NEXIUM) 40  MG capsule Take 40 mg by mouth daily at 12 noon.     fenofibrate (TRICOR) 48 MG tablet TAKE 1 TABLET BY MOUTH ONCE A DAY 90 tablet 2   linaclotide (LINZESS) 290 MCG CAPS capsule Take 1 capsule (290 mcg total) by mouth daily before breakfast. 90 capsule 1   metoprolol tartrate (LOPRESSOR) 25 MG tablet TAKE 1 TABLET(25 MG) BY MOUTH TWICE DAILY 180 tablet 3   Spacer/Aero-Holding Chambers (AEROCHAMBER MV) inhaler Use as instructed (Patient not taking: Reported on 04/01/2023)  1 each 1   No current facility-administered medications for this visit.     Review of Systems    Brief palpitation almost daily but no sustained arrhythmias.  She denies chest pain, dyspnea, PND, orthopnea, dizziness, syncope, edema, or early satiety.  All other systems reviewed and are otherwise negative except as noted above.    Physical Exam    VS:  BP 132/64 (BP Location: Left Arm, Patient Position: Sitting, Cuff Size: Normal)   Pulse 72   Ht 5\' 3"  (1.6 m)   Wt 131 lb (59.4 kg)   SpO2 99%   BMI 23.21 kg/m  , BMI Body mass index is 23.21 kg/m.     GEN: Well nourished, well developed, in no acute distress. HEENT: normal. Neck: Supple, no JVD, carotid bruits, or masses. Cardiac: RRR, no murmurs, rubs, or gallops. No clubbing, cyanosis, edema.  Radials 2+/PT 2+ and equal bilaterally.  Respiratory:  Respirations regular and unlabored, clear to auscultation bilaterally. GI: Soft, nontender, nondistended, BS + x 4. MS: no deformity or atrophy. Skin: warm and dry, no rash. Neuro:  Strength and sensation are intact. Psych: Normal affect.  Accessory Clinical Findings    ECG personally reviewed by me today - EKG Interpretation Date/Time:  Friday April 01 2023 15:01:26 EDT Ventricular Rate:  72 PR Interval:  168 QRS Duration:  100 QT Interval:  402 QTC Calculation: 440 R Axis:   63  Text Interpretation: Normal sinus rhythm Normal ECG Confirmed by Nicolasa Ducking [phone]) on 04/01/2023 3:38:12 PM  - no acute changes.  Lab Results  Component Value Date   WBC 5.8 07/05/2022   HGB 13.1 07/05/2022   HCT 38.4 07/05/2022   MCV 89.9 07/05/2022   PLT 234 07/05/2022   Lab Results  Component Value Date   CREATININE 0.71 07/05/2022   BUN 16 07/05/2022   NA 139 07/05/2022   K 4.5 07/05/2022   CL 104 07/05/2022   CO2 27 07/05/2022   Lab Results  Component Value Date   ALT 15 09/28/2022   AST 19 09/28/2022   GGT 91 (H) 10/19/2021   ALKPHOS 121 09/28/2022   BILITOT 0.2  09/28/2022   Lab Results  Component Value Date   CHOL 218 (H) 03/04/2022   HDL 67 03/04/2022   LDLCALC 124 (H) 03/04/2022   TRIG 135 03/04/2022   CHOLHDL 3.3 03/04/2022     Assessment & Plan    1.  Paroxysmal atrial fibrillation: Discovered on monitoring over the summer of August 2023 with less than 1% burden.  She has been doing well with only brief episodes of palpitations lasting a few seconds throughout the week.  No sustained arrhythmias.  She remains active without symptoms or limitations.  Continue beta-blocker and Eliquis.  She is scheduled for follow-up of annual lab work with her primary care physician next Monday.  Lab work here in January showed normal H&H, renal function, and electrolytes.  2.  Primary hypertension: Stable on beta-blocker therapy.  3.  COPD: Quit smoking 8 years ago.  Lungs are clear on examination.  4.  Hyperlipidemia: LDL of 120 01 March 2022.  Coronary CT angiogram with calcium score of 0.  She is on fenofibrate therapy and this is managed by her primary care provider.  5.  Disposition: Follow-up in clinic in 6 months or sooner if necessary.  Nicolasa Ducking, NP 04/01/2023, 4:13 PM

## 2023-04-06 ENCOUNTER — Other Ambulatory Visit: Payer: Self-pay | Admitting: Cardiology

## 2023-04-06 DIAGNOSIS — I4891 Unspecified atrial fibrillation: Secondary | ICD-10-CM

## 2023-04-07 ENCOUNTER — Telehealth: Payer: Self-pay | Admitting: Pharmacy Technician

## 2023-04-07 ENCOUNTER — Other Ambulatory Visit (HOSPITAL_COMMUNITY): Payer: Self-pay

## 2023-04-07 NOTE — Telephone Encounter (Signed)
Eliquis 5mg  refill request received. Patient is 62 years old, weight-59.4kg, Crea-0.71 on 07/05/22, Diagnosis-Afib, and last seen by Ward Givens on 04/01/23. Dose is appropriate based on dosing criteria. Will send in refill to requested pharmacy.

## 2023-04-07 NOTE — Telephone Encounter (Signed)
Pharmacy Patient Advocate Encounter   Received notification from CoverMyMeds that prior authorization for eliquis is required/requested.   Insurance verification completed.   The patient is insured through  [employer]  .   Per test claim: PA required; PA submitted to [facility] via CoverMyMeds Key/confirmation #/EOC [id number] Status is pending

## 2023-04-25 ENCOUNTER — Other Ambulatory Visit (HOSPITAL_COMMUNITY): Payer: Self-pay

## 2023-05-02 ENCOUNTER — Other Ambulatory Visit (HOSPITAL_COMMUNITY): Payer: Self-pay

## 2023-05-06 ENCOUNTER — Other Ambulatory Visit (HOSPITAL_COMMUNITY): Payer: Self-pay

## 2023-05-06 NOTE — Telephone Encounter (Signed)
Eliquis does not require Prior auth. Patient has to get medication through the mail order pharmacy which is set up for patient already.

## 2023-07-23 ENCOUNTER — Other Ambulatory Visit: Payer: Self-pay | Admitting: Gastroenterology

## 2023-08-16 ENCOUNTER — Encounter: Payer: Self-pay | Admitting: Internal Medicine

## 2023-08-16 ENCOUNTER — Ambulatory Visit: Payer: PRIVATE HEALTH INSURANCE | Admitting: Internal Medicine

## 2023-08-16 VITALS — BP 130/70 | Ht 64.0 in | Wt 131.0 lb

## 2023-08-16 DIAGNOSIS — E782 Mixed hyperlipidemia: Secondary | ICD-10-CM | POA: Diagnosis not present

## 2023-08-16 DIAGNOSIS — M810 Age-related osteoporosis without current pathological fracture: Secondary | ICD-10-CM

## 2023-08-16 DIAGNOSIS — I10 Essential (primary) hypertension: Secondary | ICD-10-CM

## 2023-08-16 DIAGNOSIS — K5909 Other constipation: Secondary | ICD-10-CM

## 2023-08-16 DIAGNOSIS — I4891 Unspecified atrial fibrillation: Secondary | ICD-10-CM

## 2023-08-16 DIAGNOSIS — K200 Eosinophilic esophagitis: Secondary | ICD-10-CM

## 2023-08-16 DIAGNOSIS — J432 Centrilobular emphysema: Secondary | ICD-10-CM

## 2023-08-16 DIAGNOSIS — R739 Hyperglycemia, unspecified: Secondary | ICD-10-CM

## 2023-08-16 DIAGNOSIS — K219 Gastro-esophageal reflux disease without esophagitis: Secondary | ICD-10-CM

## 2023-08-16 DIAGNOSIS — I700 Atherosclerosis of aorta: Secondary | ICD-10-CM

## 2023-08-16 DIAGNOSIS — J4520 Mild intermittent asthma, uncomplicated: Secondary | ICD-10-CM

## 2023-08-16 MED ORDER — BREZTRI AEROSPHERE 160-9-4.8 MCG/ACT IN AERO: 2.0000 | INHALATION_SPRAY | Freq: Two times a day (BID) | RESPIRATORY_TRACT | Status: DC

## 2023-08-16 NOTE — Assessment & Plan Note (Signed)
Will trial breztri Continue albuterol only as needed

## 2023-08-16 NOTE — Assessment & Plan Note (Signed)
Avoid foods that trigger reflux Continue esomeprazole as needed

## 2023-08-16 NOTE — Progress Notes (Signed)
Subjective:    Patient ID: Jill King, female    DOB: December 22, 1960, 63 y.o.   MRN: [id number]  HPI  Pt presents to the clinic today to establish care and for management of the conditions listed below.  HTN: Her BP today is 130/70. She is taking metoprolol as prescribed. ECG from 03/2023 reviewed.  Afib: She denies palpitations. Managed with metoprolol and eliquis. ECG from 03/2023 reviewed.  GERD with eosinophilic esophagitis: She does have some difficulty swallowing but she reports this has improved.  She denies breakthrough on esomeprazole but does take tagament as needed for breakthrough.  Upper GI from 12/2015 reviewed.  Asthma/COPD: She denies chronic cough but reports chronic shortness of breath.  She is using albuterol only as needed.  There are no PFTs on file.  She does not follow with pulmonology.  Chronic back pain: She reports previous fracture. She is not currently taking any medication. She does not follow with orthopedics or pain management.  HLD with aortic atherosclerosis: Her last LDL was 124, triglycerides 135, 2023.  She is taking fenofibrate as prescribed.  She is taking eliquis as well.  She tries to consume low-fat diet.  Chronic constipation: Managed with linzess.  Colonoscopy from 12/2015 reviewed.  Osteoporosis: Managed with alendronate.  There is no bone density on file.  Review of Systems   Past Medical History:  Diagnosis Date   Alcohol use    Arthritis    hands, wrist   Asthma    Chest pain    a. 01/2022 Cor CTA: Ca2+ = 0. Nl cors.   COPD (chronic obstructive pulmonary disease) (HCC)    Emphysema of lung (HCC)    GERD (gastroesophageal reflux disease)    Eosinophilic esophagitis 2017   History of echocardiogram    a. 12/2021 Echo: EF 55-60%, no rwma, nl RV fxn.   Osteoporosis    PAF (paroxysmal atrial fibrillation) (HCC)    a. 01/2022 Zio: Predominantly sinus rhythm, average 80 (56-104).  Less than 1% A-fib burden (80-150 bpm).  Isolated PVC/PACs.  11 brief atrial runs-->Eliquis 5 bid (CHA2DS2VASc= 2).   Wears dentures    full upper and lower    Current Outpatient Medications  Medication Sig Dispense Refill   albuterol (PROVENTIL HFA) 108 (90 Base) MCG/ACT inhaler Inhale 2 puffs into the lungs once every 4 to 6 hours as needed. 6.7 g 2   alendronate (FOSAMAX) 70 MG tablet TAKE 1 TABLET BY MOUTH ONCE A WEEK ON SUNDAYS. 4 tablet 5   apixaban (ELIQUIS) 5 MG TABS tablet TAKE 1 TABLET BY MOUTH TWICE DAILY 180 tablet 1   esomeprazole (NEXIUM) 40 MG capsule Take 40 mg by mouth daily at 12 noon.     fenofibrate (TRICOR) 48 MG tablet TAKE 1 TABLET BY MOUTH ONCE A DAY 90 tablet 2   linaclotide (LINZESS) 290 MCG CAPS capsule TAKE 1 CAPSULE BY MOUTH ONCE DAILY BEFORE BREAKFAST 90 capsule 0   metoprolol tartrate (LOPRESSOR) 25 MG tablet TAKE 1 TABLET(25 MG) BY MOUTH TWICE DAILY 180 tablet 3   Spacer/Aero-Holding Chambers (AEROCHAMBER MV) inhaler Use as instructed (Patient not taking: Reported on 04/01/2023) 1 each 1   No current facility-administered medications for this visit.    No Known Allergies  Family History  Problem Relation Age of Onset   COPD Mother    Diabetes Father    Heart disease Father    CAD Brother     Social History   Socioeconomic History   Marital status: Single  Spouse name: Not on file   Number of children: Not on file   Years of education: Not on file   Highest education level: Not on file  Occupational History   Not on file  Tobacco Use   Smoking status: Former    Current packs/day: 0.00    Average packs/day: 1 pack/day for 30.0 years (30.0 ttl pk-yrs)    Types: Cigarettes    Start date: 12/25/1984    Quit date: 12/26/2014    Years since quitting: 8.6   Smokeless tobacco: Never  Vaping Use   Vaping status: Never Used  Substance and Sexual Activity   Alcohol use: Yes    Comment: 1 drink /2 weeks   Drug use: No   Sexual activity: Not on file  Other Topics Concern   Not on file  Social History  Narrative   Not on file   Social Drivers of Health   Financial Resource Strain: Not on file  Food Insecurity: Not on file  Transportation Needs: Not on file  Physical Activity: Not on file  Stress: Not on file  Social Connections: Not on file  Intimate Partner Violence: Not on file     Constitutional: Denies fever, malaise, fatigue, headache or abrupt weight changes.  HEENT: Denies eye pain, eye redness, ear pain, ringing in the ears, wax buildup, runny nose, nasal congestion, bloody nose, or sore throat. Respiratory: Pt reports intermittent shortness of breath. Denies difficulty breathing, cough or sputum production.   Cardiovascular: Denies chest pain, chest tightness, palpitations or swelling in the hands or feet.  Gastrointestinal: Pt reports constipation. Denies abdominal pain, bloating, diarrhea or blood in the stool.  GU: Denies urgency, frequency, pain with urination, burning sensation, blood in urine, odor or discharge. Musculoskeletal: Pt reports chronic back pain.  Denies decrease in range of motion, difficulty with gait,  or joint swelling.  Skin: Denies redness, rashes, lesions or ulcercations.  Neurological: Denies dizziness, difficulty with memory, difficulty with speech or problems with balance and coordination.  Psych: Denies anxiety, depression, SI/HI.  No other specific complaints in a complete review of systems (except as listed in HPI above).      Objective:   Physical Exam  BP 130/70 (BP Location: Left Arm, Patient Position: Sitting, Cuff Size: Normal)   Ht 5\' 4"  (1.626 m)   Wt 131 lb (59.4 kg)   BMI 22.49 kg/m   Wt Readings from Last 3 Encounters:  04/01/23 131 lb (59.4 kg)  09/28/22 130 lb (59 kg)  08/05/22 129 lb 3.2 oz (58.6 kg)    General: Appears her stated age, well developed, well nourished in NAD. Skin: Warm, dry and intact.  HEENT: Head: normal shape and size; Eyes: sclera white, no icterus, conjunctiva pink, PERRLA and EOMs intact;   Cardiovascular: Normal rate and rhythm. S1,S2 noted.  No murmur, rubs or gallops noted. No JVD or BLE edema. No carotid bruits noted. Pulmonary/Chest: Normal effort and diminished vesicular breath sounds. No respiratory distress. No wheezes, rales or ronchi noted.  Musculoskeletal:  No difficulty with gait.  Neurological: Alert and oriented. Coordination normal.  Psychiatric: Mood and affect normal. Behavior is normal. Judgment and thought content normal.    BMET    Component Value Date/Time   NA 139 07/05/2022 1146   NA 138 10/19/2021 1346   NA 142 03/14/2014 0931   K 4.5 07/05/2022 1146   K 4.1 03/14/2014 0931   CL 104 07/05/2022 1146   CL 109 (H) 03/14/2014 0931   CO2  27 07/05/2022 1146   CO2 26 03/14/2014 0931   GLUCOSE 126 (H) 07/05/2022 1146   GLUCOSE 75 03/14/2014 0931   BUN 16 07/05/2022 1146   BUN 10 10/19/2021 1346   BUN 11 03/14/2014 0931   CREATININE 0.71 07/05/2022 1146   CREATININE 0.45 (L) 03/14/2014 0931   CALCIUM 9.4 07/05/2022 1146   CALCIUM 8.9 03/14/2014 0931   GFRNONAA >60 07/05/2022 1146   GFRNONAA >60 03/14/2014 0931   GFRAA >60 12/18/2018 2356   GFRAA >60 03/14/2014 0931    Lipid Panel     Component Value Date/Time   CHOL 218 (H) 03/04/2022 0913   TRIG 135 03/04/2022 0913   HDL 67 03/04/2022 0913   CHOLHDL 3.3 03/04/2022 0913   VLDL 27 03/04/2022 0913   LDLCALC 124 (H) 03/04/2022 0913    CBC    Component Value Date/Time   WBC 5.8 07/05/2022 1146   RBC 4.27 07/05/2022 1146   HGB 13.1 07/05/2022 1146   HGB 13.5 03/14/2014 0931   HCT 38.4 07/05/2022 1146   HCT 40.9 03/14/2014 0931   PLT 234 07/05/2022 1146   PLT 224 03/14/2014 0931   MCV 89.9 07/05/2022 1146   MCV 96 03/14/2014 0931   MCH 30.7 07/05/2022 1146   MCHC 34.1 07/05/2022 1146   RDW 11.5 07/05/2022 1146   RDW 11.9 03/14/2014 0931   LYMPHSABS 1.7 03/14/2014 0931   MONOABS 0.5 03/14/2014 0931   EOSABS 0.3 03/14/2014 0931   BASOSABS 0.1 03/14/2014 0931    Hgb A1C No  results found for: "HGBA1C"          Assessment & Plan:    RTC in 6 months for your annual exam Nicki Reaper, NP

## 2023-08-16 NOTE — Assessment & Plan Note (Signed)
 Continue metoprolol and eliquis She will continue to follow with cardiology

## 2023-08-16 NOTE — Assessment & Plan Note (Signed)
CMET and lipid profile today Encouraged her to consume a low fat diet Continue fenofibrate, will discuss statin therapy is LDL > 70. Continue eliquis

## 2023-08-16 NOTE — Assessment & Plan Note (Signed)
Encouraged regular stretching Ok to take tylenol 1000 MG 3 times daily as needed

## 2023-08-16 NOTE — Assessment & Plan Note (Signed)
Continue alendronate We will just getting bone density exam at annual exam

## 2023-08-16 NOTE — Assessment & Plan Note (Signed)
 Continue albuterol as needed

## 2023-08-16 NOTE — Assessment & Plan Note (Signed)
Encouraged high-fiber diet and adequate water intake Continue Linzess 

## 2023-08-16 NOTE — Assessment & Plan Note (Signed)
Continue esomeprazole as needed

## 2023-08-16 NOTE — Patient Instructions (Signed)
 Atherosclerosis  Atherosclerosis is when plaque builds up in the arteries. This causes narrowing and hardening of the arteries. Arteries are blood vessels that carry blood from the heart to all parts of the body. This blood contains oxygen. Plaque occurs due to inflammation or from a buildup of fat, cholesterol, calcium, waste products of cells, and a clotting material in the blood (fibrin). Plaque decreases the amount of blood that can flow through the artery. Atherosclerosis can affect any artery in your body, including: Heart arteries. Damage to these arteries may lead to coronary artery disease, which can cause a heart attack. Brain arteries. Damage to these arteries may cause a stroke. Leg, arm, and pelvis arteries. Peripheral artery disease (PAD) may result from damage to these arteries. Kidney arteries. Kidney (renal) failure may result from damage to kidney arteries. Treatment may slow the disease and prevent further damage to your heart, brain, peripheral arteries, and kidneys. What are the causes? This condition develops slowly over many years. The inner layers of your arteries become damaged and allow the gradual buildup of plaque. The exact cause of atherosclerosis is not fully understood. Symptoms of atherosclerosis do not occur until an artery becomes narrow or blocked. What increases the risk? The following factors may make you more likely to develop this condition: Being middle-aged or older. Certain medical conditions, including: High blood pressure. High cholesterol. High blood fats (triglycerides). Diabetes. Sleep apnea. Obesity. Certain lab levels, including: Elevated C-reactive protein (CRP). This is a sign of increased inflammation in your body. Elevated homocysteine levels. This is an amino acid that is associated with heart and blood vessel disease. Using tobacco or nicotine products. A family history of atherosclerosis. Not exercising enough (sedentary  lifestyle). Being stressed. Drinking too much alcohol or using drugs, such as cocaine or methamphetamine. What are the signs or symptoms? Symptoms of atherosclerosis do not occur until the plaque severely narrows or blocks the artery, which decreases blood flow. Sometimes, atherosclerosis does not cause symptoms. Symptoms of this condition include: Coronary artery disease. This may cause chest pain and shortness of breath. Decreased blood supply to your brain, which may cause a stroke. Signs of a stroke may include sudden: Weakness or numbness in your face, arm, or leg, especially on one side of your body. Trouble walking or difficulty moving your arms or legs. Loss of balance or coordination. Confusion. Slurred speech. Trouble speaking, or trouble understanding speech, or both (aphasia). Vision changes in one or both eyes. This may be double vision, blurred vision, or loss of vision. Severe headache with no known cause. The headache is often described as the worst headache ever experienced. PAD, which may cause pain, numbness, or nonhealing wounds, often in your legs and hips. Renal failure. This may cause tiredness, problems with urination, swelling, and itchy skin. How is this diagnosed? This condition is diagnosed based on your medical history and a physical exam. During the exam, your health care provider will: Check your pulse in different places. Listen for a "whooshing" sound over your arteries (bruit). You may also have tests, such as: Blood tests to check your levels of cholesterol, triglycerides, blood sugar, and CRP. Ankle-brachial index to compare blood pressure in your arms to blood pressure in your ankles to see how your blood is flowing. Heart (cardiac) tests. Electrocardiogram (ECG) to check for heart damage. Stress test to see how your heart reacts to exercise. Ultrasound tests. Ultrasound of your peripheral arteries to check blood flow. Echocardiogram to get images of  your heart's  chambers and valves. X-ray tests. Chest X-ray to see if you have an enlarged heart, which is a sign of heart failure. CT scan to check for damage to your heart, brain, or arteries. Angiogram. This is a test where dye is injected and X-rays are used to see the blood flow in the arteries. How is this treated? This condition is treated with lifestyle changes as the first step. These may include: Changing your diet. Losing weight. Reducing stress. Exercising and being physically active more regularly. Quitting smoking. You may also need medicine to: Lower triglycerides and cholesterol. Control blood pressure. Prevent blood clots. Lower inflammation in your body. Control your blood sugar. Sometimes, surgery is needed to: Remove plaque from an artery (endarterectomy). Open or widen a narrowed heart artery or peripheral artery (angioplasty). Create a new path for your blood with one of these procedures: Heart (coronary) artery bypass graft surgery. Peripheral artery bypass graft surgery. Place a small mesh tube (stent) in an artery to open or widen a narrowed artery. Follow these instructions at home: Eating and drinking  Eat a heart-healthy diet. Talk with your health care provider or a dietitian if you need help. A heart-healthy diet involves: Limiting unhealthy fats and increasing healthy fats. Some examples of healthy fats are avocados and olive oil. Eating plant-based foods, such as fruits, vegetables, nuts, whole grains, and legumes (such as peas and lentils). If you drink alcohol: Limit how much you have to: 0-1 drink a day for women who are not pregnant. 0-2 drinks a day for men. Know how much alcohol is in a drink. In the [place] one drink equals one 12 oz bottle of beer (355 mL), one 5 oz glass of wine (148 mL), or one 1 oz glass of hard liquor (44 mL). Lifestyle  Maintain a healthy weight. Lose weight if your health care provider says that you need to do  that. Follow an exercise program as told by your health care provider. Do not use any products that contain nicotine or tobacco. These products include cigarettes, chewing tobacco, and vaping devices, such as e-cigarettes. If you need help quitting, ask your health care provider. Do not use drugs. General instructions Take over-the-counter and prescription medicines only as told by your health care provider. Manage other health conditions as told. Keep all follow-up visits. This is important. Contact a health care provider if you have: An irregular heartbeat. Unexplained tiredness (fatigue). Trouble urinating, or you are producing less urine or foamy urine. Swelling of your hands or feet, or itchy skin. Unexplained pain or numbness in your legs or hips. A wound that is slow to heal or is not healing. Get help right away if: You have any symptoms of a heart attack. These may be: Chest pain. This includes squeezing chest pain that may feel like indigestion (angina). Shortness of breath. Pain in your neck, jaw, arms, back, or stomach. Cold sweat. Nausea. Light-headedness. Sudden pain, numbness, or coldness in a limb. You have any symptoms of a stroke. "BE FAST" is an easy way to remember the main warning signs of a stroke: B - Balance. Signs are dizziness, sudden trouble walking, or loss of balance. E - Eyes. Signs are trouble seeing or a sudden change in vision. F - Face. Signs are sudden weakness or numbness of the face, or the face or eyelid drooping on one side. A - Arms. Signs are weakness or numbness in an arm. This happens suddenly and usually on one side of the body. S -  Speech. Signs are sudden trouble speaking, slurred speech, or trouble understanding what people say. T - Time. Time to call emergency services. Write down what time symptoms started. You have other signs of a stroke, such as: A sudden, severe headache with no known cause. Nausea or vomiting. Seizure. These  symptoms may represent a serious problem that is an emergency. Do not wait to see if the symptoms will go away. Get medical help right away. Call your local emergency services (911 in the [place] Do not drive yourself to the hospital. Summary Atherosclerosis is when plaque builds up in the arteries and causes narrowing and hardening of the arteries. Plaque occurs due to inflammation or from a buildup of fat, cholesterol, calcium, cellular waste products, and fibrin. This condition may not cause any symptoms. Symptoms of atherosclerosis do not occur until the plaque severely narrows or blocks the artery. Treatment starts with lifestyle changes and may include medicines. In some cases, surgery is needed. Get help right away if you have any symptoms of a heart attack or stroke. This information is not intended to replace advice given to you by your health care provider. Make sure you discuss any questions you have with your health care provider. Document Revised: 09/17/2020 Document Reviewed: 09/17/2020 Elsevier Patient Education  2024 [employer].

## 2023-08-16 NOTE — Assessment & Plan Note (Signed)
CMET and lipid profile today Encouraged her to consume a low fat diet Continue fenofibrate, will discuss statin therapy is LDL > 70.

## 2023-08-16 NOTE — Assessment & Plan Note (Signed)
Controlled with metoprolol Will monitor

## 2023-08-17 ENCOUNTER — Encounter: Payer: Self-pay | Admitting: Internal Medicine

## 2023-08-17 DIAGNOSIS — E782 Mixed hyperlipidemia: Secondary | ICD-10-CM

## 2023-08-17 LAB — LIPID PANEL
Cholesterol: 236 mg/dL — ABNORMAL HIGH (ref <200)
HDL: 70 mg/dL (ref >=50)
LDL Cholesterol (Calc): 132 mg/dL — ABNORMAL HIGH
Non-HDL Cholesterol (Calc): 166 mg/dL — ABNORMAL HIGH (ref <130)
Total CHOL/HDL Ratio: 3.4 (calc) (ref <5.0)
Triglycerides: 204 mg/dL — ABNORMAL HIGH (ref <150)

## 2023-08-17 LAB — COMPLETE METABOLIC PANEL WITH GFR
AG Ratio: 1.9 (calc) (ref 1.0–2.5)
ALT: 16 U/L (ref 6–29)
AST: 18 U/L (ref 10–35)
Albumin: 4.4 g/dL (ref 3.6–5.1)
Alkaline phosphatase (APISO): 85 U/L (ref 37–153)
BUN: 14 mg/dL (ref 7–25)
CO2: 28 mmol/L (ref 20–32)
Calcium: 10 mg/dL (ref 8.6–10.4)
Chloride: 103 mmol/L (ref 98–110)
Creat: 0.6 mg/dL (ref 0.50–1.05)
Globulin: 2.3 g/dL (ref 1.9–3.7)
Glucose, Bld: 93 mg/dL (ref 65–139)
Potassium: 4.1 mmol/L (ref 3.5–5.3)
Sodium: 139 mmol/L (ref 135–146)
Total Bilirubin: 0.4 mg/dL (ref 0.2–1.2)
Total Protein: 6.7 g/dL (ref 6.1–8.1)
eGFR: 103 mL/min/{1.73_m2} (ref >=60)

## 2023-08-17 LAB — CBC
HCT: 37.8 % (ref 35.0–45.0)
Hemoglobin: 12.6 g/dL (ref 11.7–15.5)
MCH: 30.1 pg (ref 27.0–33.0)
MCHC: 33.3 g/dL (ref 32.0–36.0)
MCV: 90.2 fL (ref 80.0–100.0)
MPV: 9.5 fL (ref 7.5–12.5)
Platelets: 306 10*3/uL (ref 140–400)
RBC: 4.19 10*6/uL (ref 3.80–5.10)
RDW: 11.7 % (ref 11.0–15.0)
WBC: 5.9 10*3/uL (ref 3.8–10.8)

## 2023-08-17 LAB — HEMOGLOBIN A1C
Hgb A1c MFr Bld: 5.5 %{Hb} (ref <5.7)
Mean Plasma Glucose: 111 mg/dL
eAG (mmol/L): 6.2 mmol/L

## 2023-08-17 MED ORDER — ATORVASTATIN CALCIUM 20 MG PO TABS: 20.0000 mg | ORAL_TABLET | Freq: Every day | ORAL | 1 refills | Status: DC

## 2023-08-19 MED ORDER — BREZTRI AEROSPHERE 160-9-4.8 MCG/ACT IN AERO: 2.0000 | INHALATION_SPRAY | Freq: Two times a day (BID) | RESPIRATORY_TRACT | 5 refills | Status: DC

## 2023-08-19 NOTE — Addendum Note (Signed)
Addended by: Lorre Munroe on: 08/19/2023 11:19 AM   Modules accepted: Orders

## 2023-08-26 ENCOUNTER — Ambulatory Visit: Payer: PRIVATE HEALTH INSURANCE | Admitting: Internal Medicine

## 2023-08-26 ENCOUNTER — Encounter: Payer: Self-pay | Admitting: Internal Medicine

## 2023-08-26 VITALS — BP 130/68 | HR 94 | Temp 98.7°F | Ht 64.0 in | Wt 129.0 lb

## 2023-08-26 DIAGNOSIS — J329 Chronic sinusitis, unspecified: Secondary | ICD-10-CM

## 2023-08-26 DIAGNOSIS — R519 Headache, unspecified: Secondary | ICD-10-CM | POA: Diagnosis not present

## 2023-08-26 DIAGNOSIS — B9789 Other viral agents as the cause of diseases classified elsewhere: Secondary | ICD-10-CM

## 2023-08-26 DIAGNOSIS — J432 Centrilobular emphysema: Secondary | ICD-10-CM | POA: Diagnosis not present

## 2023-08-26 DIAGNOSIS — B370 Candidal stomatitis: Secondary | ICD-10-CM | POA: Diagnosis not present

## 2023-08-26 LAB — POC COVID19/FLU A&B COMBO
Covid Antigen, POC: NEGATIVE
Influenza A Antigen, POC: NEGATIVE
Influenza B Antigen, POC: NEGATIVE

## 2023-08-26 LAB — POCT RAPID STREP A (OFFICE): Rapid Strep A Screen: NEGATIVE

## 2023-08-26 MED ORDER — KETOROLAC TROMETHAMINE 30 MG/ML IJ SOLN
30.0000 mg | Freq: Once | INTRAMUSCULAR | Status: AC
  Administered 2023-08-26: 30 mg via INTRAMUSCULAR

## 2023-08-26 MED ORDER — NYSTATIN 100000 UNIT/ML MT SUSP: 5.0000 mL | Freq: Four times a day (QID) | OROMUCOSAL | 0 refills | Status: DC

## 2023-08-26 MED ORDER — FLUCONAZOLE 150 MG PO TABS: 150.0000 mg | ORAL_TABLET | Freq: Once | ORAL | 0 refills | Status: AC

## 2023-08-26 NOTE — Patient Instructions (Signed)

## 2023-08-26 NOTE — Progress Notes (Signed)
 Subjective:    Patient ID: Jill King, female    DOB: [birth year], 63 y.o.   MRN: [id number]  HPI  Discussed the use of AI scribe software for clinical note transcription with the patient, who gave verbal consent to proceed.   Jill King is a 63 year old female with COPD who presents with sore throat and fever.  She has been experiencing a sore throat for three days, initially thought to be related to her recent use of the [facility] inhaler for COPD. Despite gargling with salt water, she has developed a fever of 101F.  She describes a severe headache, which she characterizes as 'the worst migraine,' along with ear ringing and popping sensations. No ear pain is present, but she reports difficulty swallowing and an inability to taste anything.  She has a non-productive cough and notes sinus pressure and dry blood in her nose. No shortness of breath, nausea, vomiting, or diarrhea.  She works in a small place where everyone has been sick recently. Her current medications include the Breztri inhaler for COPD, which she finds beneficial.      Review of Systems   Past Medical History:  Diagnosis Date   Alcohol use    Arthritis    hands, wrist   Asthma    Chest pain    a. 01/2022 Cor CTA: Ca2+ = 0. Nl cors.   COPD (chronic obstructive pulmonary disease) (HCC)    Emphysema of lung (HCC)    GERD (gastroesophageal reflux disease)    Eosinophilic esophagitis 2017   History of echocardiogram    a. 12/2021 Echo: EF 55-60%, no rwma, nl RV fxn.   Osteoporosis    PAF (paroxysmal atrial fibrillation) (HCC)    a. 01/2022 Zio: Predominantly sinus rhythm, average 80 (56-104).  Less than 1% A-fib burden (80-150 bpm).  Isolated PVC/PACs. 11 brief atrial runs-->Eliquis 5 bid (CHA2DS2VASc= 2).   Wears dentures    full upper and lower    Current Outpatient Medications  Medication Sig Dispense Refill   albuterol (PROVENTIL HFA) 108 (90 Base) MCG/ACT inhaler Inhale 2 puffs into the lungs once  every 4 to 6 hours as needed. 6.7 g 2   alendronate (FOSAMAX) 70 MG tablet TAKE 1 TABLET BY MOUTH ONCE A WEEK ON SUNDAYS. 4 tablet 5   apixaban (ELIQUIS) 5 MG TABS tablet TAKE 1 TABLET BY MOUTH TWICE DAILY 180 tablet 1   atorvastatin (LIPITOR) 20 MG tablet Take 1 tablet (20 mg total) by mouth daily. 90 tablet 1   Budeson-Glycopyrrol-Formoterol (BREZTRI AEROSPHERE) 160-9-4.8 MCG/ACT AERO Inhale 2 puffs into the lungs 2 (two) times daily. 10.7 g 5   esomeprazole (NEXIUM) 40 MG capsule Take 40 mg by mouth daily at 12 noon. (Patient not taking: Reported on 08/16/2023)     fenofibrate (TRICOR) 48 MG tablet TAKE 1 TABLET BY MOUTH ONCE A DAY 90 tablet 2   linaclotide (LINZESS) 290 MCG CAPS capsule TAKE 1 CAPSULE BY MOUTH ONCE DAILY BEFORE BREAKFAST 90 capsule 0   metoprolol tartrate (LOPRESSOR) 25 MG tablet TAKE 1 TABLET(25 MG) BY MOUTH TWICE DAILY 180 tablet 3   No current facility-administered medications for this visit.    No Known Allergies  Family History  Problem Relation Age of Onset   COPD Mother    Diabetes Father    Heart disease Father    CAD Brother     Social History   Socioeconomic History   Marital status: Single    Spouse name: Not on file  Number of children: Not on file   Years of education: Not on file   Highest education level: Not on file  Occupational History   Not on file  Tobacco Use   Smoking status: Former    Current packs/day: 0.00    Average packs/day: 1 pack/day for 30.0 years (30.0 ttl pk-yrs)    Types: Cigarettes    Start date: 12/25/1984    Quit date: 12/26/2014    Years since quitting: 8.6   Smokeless tobacco: Never  Vaping Use   Vaping status: Never Used  Substance and Sexual Activity   Alcohol use: Yes    Comment: 1 drink /2 weeks   Drug use: No   Sexual activity: Not on file  Other Topics Concern   Not on file  Social History Narrative   Not on file   Social Drivers of Health   Financial Resource Strain: Not on file  Food Insecurity:  Not on file  Transportation Needs: Not on file  Physical Activity: Not on file  Stress: Not on file  Social Connections: Not on file  Intimate Partner Violence: Not on file     Constitutional: Pt reports headache, fever. Denies malaise, fatigue, or abrupt weight changes.  HEENT: Pt reports ringing in the ears, sore throat. Denies eye pain, eye redness, ear pain, wax buildup, runny nose, nasal congestion, bloody nose. Respiratory: Pt reports cough. Denies difficulty breathing, shortness of breath, or sputum production.   Cardiovascular: Denies chest pain, chest tightness, palpitations or swelling in the hands or feet.  Gastrointestinal: Denies abdominal pain, bloating, constipation, diarrhea or blood in the stool.  Musculoskeletal: Denies decrease in range of motion, difficulty with gait, muscle pain or joint pain and swelling.  Neurological: Denies dizziness, difficulty with memory, difficulty with speech or problems with balance and coordination.   No other specific complaints in a complete review of systems (except as listed in HPI above).      Objective:   Physical Exam  BP 130/68 (BP Location: Left Arm, Patient Position: Sitting, Cuff Size: Normal)   Pulse 94   Temp 98.7 F (37.1 C)   Ht 5\' 4"  (1.626 m)   Wt 129 lb (58.5 kg)   SpO2 95%   BMI 22.14 kg/m   Wt Readings from Last 3 Encounters:  08/16/23 131 lb (59.4 kg)  04/01/23 131 lb (59.4 kg)  09/28/22 130 lb (59 kg)    General: Appears her stated age, well developed, well nourished in NAD. Skin: Warm, dry and intact. No rashes noted. HEENT: Head: normal shape and size, frontal sinus tenderness noted; Eyes: sclera white, no icterus, conjunctiva pink, PERRLA and EOMs intact; Nose: mucosa pink and moist, septum midline, dried bloody discharge noted; Throat/Mouth: Teeth present, mucosa erythematous and moist, white patchy exudate noted on tongues, soft palate and back of throat.  No ulcerations noted.  Neck:  No  adenopathy. Cardiovascular: Normal rate and rhythm.  Pulmonary/Chest: Normal effort and diminished breath sounds. No respiratory distress. No wheezes, rales or ronchi noted.  Neurological: Alert and oriented.   BMET    Component Value Date/Time   NA 139 08/16/2023 1059   NA 138 10/19/2021 1346   NA 142 03/14/2014 0931   K 4.1 08/16/2023 1059   K 4.1 03/14/2014 0931   CL 103 08/16/2023 1059   CL 109 (H) 03/14/2014 0931   CO2 28 08/16/2023 1059   CO2 26 03/14/2014 0931   GLUCOSE 93 08/16/2023 1059   GLUCOSE 75 03/14/2014 0931  BUN 14 08/16/2023 1059   BUN 10 10/19/2021 1346   BUN 11 03/14/2014 0931   CREATININE 0.60 08/16/2023 1059   CALCIUM 10.0 08/16/2023 1059   CALCIUM 8.9 03/14/2014 0931   GFRNONAA >60 07/05/2022 1146   GFRNONAA >60 03/14/2014 0931   GFRAA >60 12/18/2018 2356   GFRAA >60 03/14/2014 0931    Lipid Panel     Component Value Date/Time   CHOL 236 (H) 08/16/2023 1059   TRIG 204 (H) 08/16/2023 1059   HDL 70 08/16/2023 1059   CHOLHDL 3.4 08/16/2023 1059   VLDL 27 03/04/2022 0913   LDLCALC 132 (H) 08/16/2023 1059    CBC    Component Value Date/Time   WBC 5.9 08/16/2023 1059   RBC 4.19 08/16/2023 1059   HGB 12.6 08/16/2023 1059   HGB 13.5 03/14/2014 0931   HCT 37.8 08/16/2023 1059   HCT 40.9 03/14/2014 0931   PLT 306 08/16/2023 1059   PLT 224 03/14/2014 0931   MCV 90.2 08/16/2023 1059   MCV 96 03/14/2014 0931   MCH 30.1 08/16/2023 1059   MCHC 33.3 08/16/2023 1059   RDW 11.7 08/16/2023 1059   RDW 11.9 03/14/2014 0931   LYMPHSABS 1.7 03/14/2014 0931   MONOABS 0.5 03/14/2014 0931   EOSABS 0.3 03/14/2014 0931   BASOSABS 0.1 03/14/2014 0931    Hgb A1C Lab Results  Component Value Date   HGBA1C 5.5 08/16/2023            Assessment & Plan:   Assessment and Plan    Oral Thrush Likely secondary to [facility] inhaler use. Noted white patches on tongue and roof of mouth. -Start Nystatin 5ml swish and spit four times a day for five  days. -Start Diflucan 150mg  once. -Continue to rinse mouth after inhaler use. -Replace toothbrush.  Viral Sinusitis Presents with fever, headache, and nasal congestion. No signs of bacterial infection at this time.  COVID and flu test negative. -Continue supportive care with salt water gargles and over-the-counter nasal sprays. -Monitor symptoms and contact office if condition worsens.  COPD Stable on Breztri inhaler. -Continue Breztri inhaler as prescribed.  Headache Likely secondary to sinus pressure. -Administer Toradol shot today to alleviate pain.   RTC in 6 months for your annual exam Nicki Reaper, NP

## 2023-08-28 ENCOUNTER — Ambulatory Visit
Admission: EM | Admit: 2023-08-28 | Discharge: 2023-08-28 | Disposition: A | Discharge status: Home / Self Care | Payer: PRIVATE HEALTH INSURANCE | Attending: Physician Assistant | Admitting: Physician Assistant

## 2023-08-28 ENCOUNTER — Encounter: Payer: Self-pay | Admitting: Emergency Medicine

## 2023-08-28 ENCOUNTER — Ambulatory Visit (INDEPENDENT_AMBULATORY_CARE_PROVIDER_SITE_OTHER): Payer: PRIVATE HEALTH INSURANCE

## 2023-08-28 DIAGNOSIS — R058 Other specified cough: Secondary | ICD-10-CM

## 2023-08-28 DIAGNOSIS — J441 Chronic obstructive pulmonary disease with (acute) exacerbation: Secondary | ICD-10-CM

## 2023-08-28 DIAGNOSIS — R0981 Nasal congestion: Secondary | ICD-10-CM

## 2023-08-28 DIAGNOSIS — R0602 Shortness of breath: Secondary | ICD-10-CM | POA: Diagnosis not present

## 2023-08-28 DIAGNOSIS — R6883 Chills (without fever): Secondary | ICD-10-CM

## 2023-08-28 MED ORDER — PROMETHAZINE-DM 6.25-15 MG/5ML PO SYRP: 5.0000 mL | ORAL_SOLUTION | Freq: Four times a day (QID) | ORAL | 0 refills | Status: DC | PRN

## 2023-08-28 MED ORDER — AMOXICILLIN-POT CLAVULANATE 875-125 MG PO TABS: 1.0000 | ORAL_TABLET | Freq: Two times a day (BID) | ORAL | 0 refills | Status: AC

## 2023-08-28 MED ORDER — PREDNISONE 20 MG PO TABS: 40.0000 mg | ORAL_TABLET | Freq: Every day | ORAL | 0 refills | Status: AC

## 2023-08-28 NOTE — ED Provider Notes (Signed)
 [facility]    CSN: [id number] Arrival date & time: 08/28/23  7829      History   Chief Complaint Chief Complaint  Patient presents with   Cough   Fever    HPI Jill King is a 63 y.o. female with history of COPD, hypertension, hyperlipidemia, asthma, GERD, atrial fibrillation, alcohol abuse and osteoporosis.  Patient presents today for chills, body aches, fatigue, cough and congestion x  3 days (symptoms began Thursday- per patient). Seen by PCP on 08/26/2023, the day after onset of symptoms, and had negative COVID, flu and strep testing.  Diagnosed with viral sinusitis and thrush (related to [facility] inhaler). Treated with Diflucan and Nystatin.   Patient says over the past 2 days her symptoms of gotten worse.  Reports brownish colored sputum, increased shortness of breath, left sided thoracic back pain, worsening fatigue and bodyaches.  Sore throat has improved a little bit.  Has been taking Tessalon cough medicine without relief.  No other complaints.  HPI  Past Medical History:  Diagnosis Date   Alcohol use    Arthritis    hands, wrist   Asthma    Chest pain    a. 01/2022 Cor CTA: Ca2+ = 0. Nl cors.   COPD (chronic obstructive pulmonary disease) (HCC)    Emphysema of lung (HCC)    GERD (gastroesophageal reflux disease)    Eosinophilic esophagitis 2017   History of echocardiogram    a. 12/2021 Echo: EF 55-60%, no rwma, nl RV fxn.   Osteoporosis    PAF (paroxysmal atrial fibrillation) (HCC)    a. 01/2022 Zio: Predominantly sinus rhythm, average 80 (56-104).  Less than 1% A-fib burden (80-150 bpm).  Isolated PVC/PACs. 11 brief atrial runs-->Eliquis 5 bid (CHA2DS2VASc= 2).   Wears dentures    full upper and lower    Patient Active Problem List   Diagnosis Date Noted   Aortic atherosclerosis (HCC) 08/16/2023   Chronic constipation 08/16/2023   Osteoporosis 08/16/2023   New onset atrial fibrillation (HCC): CHA2DS2Vasc = 3 (HTN, F, Ao Atherosclerosis).  02/18/2022   Eosinophilic esophagitis 01/25/2022   Chronic back pain 01/25/2022   HLD (hyperlipidemia) 01/25/2022   HTN (hypertension) 01/25/2022   COPD (chronic obstructive pulmonary disease) (HCC) 01/01/2016   Asthma 01/01/2016   GERD (gastroesophageal reflux disease) 01/01/2016    Past Surgical History:  Procedure Laterality Date   CESAREAN SECTION     COLONOSCOPY WITH PROPOFOL N/A 01/08/2016   Procedure: COLONOSCOPY WITH PROPOFOL;  Surgeon: Midge Minium, MD;  Location: [facility];  Service: Endoscopy;  Laterality: N/A;   ESOPHAGOGASTRODUODENOSCOPY (EGD) WITH PROPOFOL N/A 01/08/2016   Procedure: ESOPHAGOGASTRODUODENOSCOPY (EGD) WITH PROPOFOL;  Surgeon: Midge Minium, MD;  Location: [facility];  Service: Endoscopy;  Laterality: N/A;   MULTIPLE TOOTH EXTRACTIONS     POLYPECTOMY  01/08/2016   Procedure: POLYPECTOMY;  Surgeon: Midge Minium, MD;  Location: [facility];  Service: Endoscopy;;    OB History   No obstetric history on file.      Home Medications    Prior to Admission medications   Medication Sig Start Date End Date Taking? Authorizing Provider  amoxicillin-clavulanate (AUGMENTIN) 875-125 MG tablet Take 1 tablet by mouth every 12 (twelve) hours for 7 days. 08/28/23 09/04/23 Yes Shirlee Latch, PA-C  predniSONE (DELTASONE) 20 MG tablet Take 2 tablets (40 mg total) by mouth daily for 5 days. 08/28/23 09/02/23 Yes Shirlee Latch, PA-C  promethazine-dextromethorphan (PROMETHAZINE-DM) 6.25-15 MG/5ML syrup Take 5 mLs by mouth 4 (  four) times daily as needed. 08/28/23  Yes Shirlee Latch, PA-C  albuterol (PROVENTIL HFA) 108 (90 Base) MCG/ACT inhaler Inhale 2 puffs into the lungs once every 4 to 6 hours as needed. 01/26/22   Esaw Grandchild A, DO  alendronate (FOSAMAX) 70 MG tablet TAKE 1 TABLET BY MOUTH ONCE A WEEK ON SUNDAYS. 10/06/21     apixaban (ELIQUIS) 5 MG TABS tablet TAKE 1 TABLET BY MOUTH TWICE DAILY 04/07/23   Marykay Lex, MD  atorvastatin (LIPITOR) 20  MG tablet Take 1 tablet (20 mg total) by mouth daily. 08/17/23   Lorre Munroe, NP  Budeson-Glycopyrrol-Formoterol (BREZTRI AEROSPHERE) 160-9-4.8 MCG/ACT AERO Inhale 2 puffs into the lungs 2 (two) times daily. 08/19/23   Lorre Munroe, NP  esomeprazole (NEXIUM) 40 MG capsule Take 40 mg by mouth daily at 12 noon.    [provider]  fenofibrate (TRICOR) 48 MG tablet TAKE 1 TABLET BY MOUTH ONCE A DAY 10/08/21     linaclotide (LINZESS) 290 MCG CAPS capsule TAKE 1 CAPSULE BY MOUTH ONCE DAILY BEFORE BREAKFAST 07/25/23   Midge Minium, MD  metoprolol tartrate (LOPRESSOR) 25 MG tablet TAKE 1 TABLET(25 MG) BY MOUTH TWICE DAILY 09/14/22   Marykay Lex, MD  nystatin (MYCOSTATIN) 100000 UNIT/ML suspension Take 5 mLs (500,000 Units total) by mouth 4 (four) times daily. 08/26/23   Lorre Munroe, NP    Family History Family History  Problem Relation Age of Onset   COPD Mother    Diabetes Father    Heart disease Father    CAD Brother     Social History Social History   Tobacco Use   Smoking status: Former    Current packs/day: 0.00    Average packs/day: 1 pack/day for 30.0 years (30.0 ttl pk-yrs)    Types: Cigarettes    Start date: 12/25/1984    Quit date: 12/26/2014    Years since quitting: 8.6   Smokeless tobacco: Never  Vaping Use   Vaping status: Never Used  Substance Use Topics   Alcohol use: Yes    Comment: 1 drink /2 weeks   Drug use: No     Allergies   Patient has no known allergies.   Review of Systems Review of Systems  Constitutional:  Positive for chills, fatigue and fever (subjective). Negative for diaphoresis.  HENT:  Positive for congestion, rhinorrhea and sore throat. Negative for ear pain, sinus pressure and sinus pain.   Respiratory:  Positive for cough and shortness of breath.   Cardiovascular:  Negative for chest pain.  Gastrointestinal:  Negative for abdominal pain, nausea and vomiting.  Musculoskeletal:  Positive for back pain and myalgias.  Skin:   Negative for rash.  Neurological:  Negative for weakness and headaches.  Hematological:  Negative for adenopathy.     Physical Exam Triage Vital Signs ED Triage Vitals  Encounter Vitals Group     BP      Systolic BP Percentile      Diastolic BP Percentile      Pulse      Resp      Temp      Temp src      SpO2      Weight      Height      Head Circumference      Peak Flow      Pain Score      Pain Loc      Pain Education      Exclude from  Growth Chart    No data found.  Updated Vital Signs BP 122/79 (BP Location: Left Arm)   Pulse 96   Temp 98.6 F (37 C) (Oral)   Resp 15   Ht 5\' 4"  (1.626 m)   Wt 128 lb 15.5 oz (58.5 kg)   SpO2 95%   BMI 22.14 kg/m   Physical Exam Vitals and nursing note reviewed.  Constitutional:      General: She is not in acute distress.    Appearance: Normal appearance. She is ill-appearing. She is not toxic-appearing.  HENT:     Head: Normocephalic and atraumatic.     Right Ear: Tympanic membrane, ear canal and external ear normal.     Left Ear: Tympanic membrane, ear canal and external ear normal.     Nose: Congestion present.     Mouth/Throat:     Mouth: Mucous membranes are moist.     Pharynx: Oropharynx is clear. Posterior oropharyngeal erythema present.     Comments: Whitish exudates of oropharynx and tongue Eyes:     General: No scleral icterus.       Right eye: No discharge.        Left eye: No discharge.     Conjunctiva/sclera: Conjunctivae normal.  Cardiovascular:     Rate and Rhythm: Normal rate and regular rhythm.     Heart sounds: Normal heart sounds.  Pulmonary:     Effort: Pulmonary effort is normal. No respiratory distress.     Breath sounds: Rhonchi present.  Musculoskeletal:     Cervical back: Neck supple.  Skin:    General: Skin is dry.  Neurological:     General: No focal deficit present.     Mental Status: She is alert. Mental status is at baseline.     Motor: No weakness.     Gait: Gait normal.   Psychiatric:        Mood and Affect: Mood normal.        Behavior: Behavior normal.      UC Treatments / Results  Labs (all labs ordered are listed, but only abnormal results are displayed) Labs Reviewed - No data to display      Component Ref Range & Units (hover) 2 d ago  Rapid Strep A Screen Negative        Specimen Collected: 08/26/23 10:52 Last Resulted: 08/26/23 10:52     POC Covid19/Flu A&B Antigen Order: [id number]     Test Result Released: Yes (seen)   0 Result Notes    Component Ref Range & Units (hover) 2 d ago  Influenza A Antigen, POC Negative  Influenza B Antigen, POC Negative  Covid Antigen, POC Negative        Specimen Collected: 08/26/23 10:52 Last Resulted: 08/26/23 10:52    EKG   Radiology DG Chest 2 View Result Date: 08/28/2023 CLINICAL DATA:  Left-sided back pain. Shortness of breath. Chills for 3-4 days. History of COPD. EXAM: CHEST - 2 VIEW COMPARISON:  01/25/2022 FINDINGS: Hyperinflation. Mild superior endplate compression deformities at multiple thoracic levels are relatively similar. Midline trachea. Normal heart size and mediastinal contours. No pleural effusion or pneumothorax. Left-greater-than-right apical pleuroparenchymal scarring. IMPRESSION: No acute cardiopulmonary disease. Hyperinflation, consistent with COPD. Electronically Signed   By: Jeronimo Greaves M.D.   On: 08/28/2023 10:50    Procedures Procedures (including critical care time)  Medications Ordered in UC Medications - No data to display  Initial Impression / Assessment and Plan / UC Course  I have reviewed  the triage vital signs and the nursing notes.  Pertinent labs & imaging results that were available during my care of the patient were reviewed by me and considered in my medical decision making (see chart for details).   63 year old female with history of asthma, COPD, hypertension, hyperlipidemia, alcohol abuse presents for cough, fatigue, aches, and  congestion x 3 days.  Evaluated by PCP 2 days ago and had negative strep, COVID and flu testing.  Diagnosed with viral sinusitis and thrush.  Supportive care was advised as well as prescribed Nystatin and fluconazole.  Symptoms worsened over the past couple days.  Has chills, worsening fatigue, increased body aches, left-sided thoracic back pain, cough productive of brownish sputum and increased shortness of breath than baseline.  Vitals are all stable and normal at this time.  She is ill-appearing but nontoxic.  Coughs frequently.  No evidence of ear infection.  Has nasal congestion, erythema of oropharynx as well as exudates of oropharynx.  Few scattered rhonchi throughout and reduced airflow.  Reviewed PCP note from 2 days ago.  Ordered chest x-ray to assess for possible pneumonia.  X-ray negative for pneumonia.  It just shows changes of COPD.  Reviewed this with patient.  COPD exacerbation.  Supportive care encouraged.  Sent Augmentin, prednisone and Promethazine DM to pharmacy.  Advised to continue use of inhalers and finish the antifungal therapy for thrush.  Reviewed return and ED precautions.  Work note provided.   Final Clinical Impressions(s) / UC Diagnoses   Final diagnoses:  Productive cough  COPD exacerbation (HCC)  Nasal congestion  Shortness of breath  Chills     Discharge Instructions      -The x-ray today does not show any pneumonia. - I sent antibiotics, prednisone and cough medicine to the pharmacy for COPD flareup.  Increase rest and fluids and use inhalers as directed.  Continue with antifungal treatment for thrush. - If you develop a fever or worsening breathing problem go to ER. - Symptoms may last a couple of weeks given your underlying COPD.     ED Prescriptions     Medication Sig Dispense Auth. Provider   amoxicillin-clavulanate (AUGMENTIN) 875-125 MG tablet Take 1 tablet by mouth every 12 (twelve) hours for 7 days. 14 tablet Eusebio Friendly B, PA-C    predniSONE (DELTASONE) 20 MG tablet Take 2 tablets (40 mg total) by mouth daily for 5 days. 10 tablet Shirlee Latch, PA-C   promethazine-dextromethorphan (PROMETHAZINE-DM) 6.25-15 MG/5ML syrup Take 5 mLs by mouth 4 (four) times daily as needed. 118 mL Shirlee Latch, PA-C      PDMP not reviewed this encounter.   Shirlee Latch, PA-C 08/28/23 1100

## 2023-08-28 NOTE — Discharge Instructions (Addendum)
-  The x-ray today does not show any pneumonia. - I sent antibiotics, prednisone and cough medicine to the pharmacy for COPD flareup.  Increase rest and fluids and use inhalers as directed.  Continue with antifungal treatment for thrush. - If you develop a fever or worsening breathing problem go to ER. - Symptoms may last a couple of weeks given your underlying COPD.

## 2023-08-28 NOTE — ED Triage Notes (Signed)
 Patient c/o cough and chest congestion and fever for the past 2 weeks.

## 2023-09-20 ENCOUNTER — Encounter: Payer: Self-pay | Admitting: Nurse Practitioner

## 2023-09-20 ENCOUNTER — Ambulatory Visit: Payer: PRIVATE HEALTH INSURANCE | Attending: Nurse Practitioner | Admitting: Nurse Practitioner

## 2023-09-20 VITALS — BP 122/64 | HR 78 | Ht 65.0 in | Wt 129.8 lb

## 2023-09-20 DIAGNOSIS — I10 Essential (primary) hypertension: Secondary | ICD-10-CM | POA: Diagnosis not present

## 2023-09-20 DIAGNOSIS — I480 Paroxysmal atrial fibrillation: Secondary | ICD-10-CM

## 2023-09-20 DIAGNOSIS — J449 Chronic obstructive pulmonary disease, unspecified: Secondary | ICD-10-CM

## 2023-09-20 DIAGNOSIS — E782 Mixed hyperlipidemia: Secondary | ICD-10-CM | POA: Diagnosis not present

## 2023-09-20 NOTE — Patient Instructions (Signed)
Medication Instructions:  No changes *If you need a refill on your cardiac medications before your next appointment, please call your pharmacy*   Lab Work: None ordered If you have labs (blood work) drawn today and your tests are completely normal, you will receive your results only by: MyChart Message (if you have MyChart) OR A paper copy in the mail If you have any lab test that is abnormal or we need to change your treatment, we will call you to review the results.   Testing/Procedures: None ordered   Follow-Up: At [facility], you and your health needs are our priority.  As part of our continuing mission to provide you with exceptional heart care, we have created designated Provider Care Teams.  These Care Teams include your primary Cardiologist (physician) and Advanced Practice Providers (APPs -  Physician Assistants and Nurse Practitioners) who all work together to provide you with the care you need, when you need it.  We recommend signing up for the patient portal called "MyChart".  Sign up information is provided on this After Visit Summary.  MyChart is used to connect with patients for Virtual Visits (Telemedicine).  Patients are able to view lab/test results, encounter notes, upcoming appointments, etc.  Non-urgent messages can be sent to your provider as well.   To learn more about what you can do with MyChart, go to [email or web address].    Your next appointment:   6 month(s)  Provider:   You may see Bryan Lemma, MD or one of the following Advanced Practice Providers on your designated Care Team:   Nicolasa Ducking, NP

## 2023-09-20 NOTE — Progress Notes (Signed)
 Office Visit    Patient Name: Jill King Date of Encounter: 09/20/2023  Primary Care Provider:  Lorre Munroe, NP Primary Cardiologist:  Jill Lemma, MD  Chief Complaint    63 y.o. female with a history of paroxysmal atrial fibrillation on Eliquis, hypertension, hyperlipidemia, COPD, and prior tobacco abuse, who presents for A-fib follow-up.   Past Medical History  Subjective   Past Medical History:  Diagnosis Date   Alcohol use    Arthritis    hands, wrist   Asthma    Chest pain    a. 01/2022 Cor CTA: Ca2+ = 0. Nl cors.   COPD (chronic obstructive pulmonary disease) (HCC)    Emphysema of lung (HCC)    GERD (gastroesophageal reflux disease)    Eosinophilic esophagitis 2017   History of echocardiogram    a. 12/2021 Echo: EF 55-60%, no rwma, nl RV fxn.   Osteoporosis    PAF (paroxysmal atrial fibrillation) (HCC)    a. 01/2022 Zio: Predominantly sinus rhythm, average 80 (56-104).  Less than 1% A-fib burden (80-150 bpm).  Isolated PVC/PACs. 11 brief atrial runs-->Eliquis 5 bid (CHA2DS2VASc= 2).   Wears dentures    full upper and lower   Past Surgical History:  Procedure Laterality Date   CESAREAN SECTION     COLONOSCOPY WITH PROPOFOL N/A 01/08/2016   Procedure: COLONOSCOPY WITH PROPOFOL;  Surgeon: Midge Minium, MD;  Location: [facility];  Service: Endoscopy;  Laterality: N/A;   ESOPHAGOGASTRODUODENOSCOPY (EGD) WITH PROPOFOL N/A 01/08/2016   Procedure: ESOPHAGOGASTRODUODENOSCOPY (EGD) WITH PROPOFOL;  Surgeon: Midge Minium, MD;  Location: [facility];  Service: Endoscopy;  Laterality: N/A;   MULTIPLE TOOTH EXTRACTIONS     POLYPECTOMY  01/08/2016   Procedure: POLYPECTOMY;  Surgeon: Midge Minium, MD;  Location: [facility];  Service: Endoscopy;;   Allergies  No Known Allergies    History of Present Illness    63 y.o. y/o female with the above past medical history including paroxysmal atrial fibrillation, hypertension, hyperlipidemia, COPD, and  prior tobacco abuse. In June 2023, she was hospitalized with syncope and chest tightness. Echo showed normal LV function without significant valvular abnormalities. Event monitoring in August 2023 showed predominantly sinus rhythm with a less than 1% A-fib burden. In the setting of a CHA2DS2-VASc of 3, she was placed on Eliquis. Due to chest tightness, coronary CT angiogram was performed in August 2023 showing a calcium score of 0 and normal coronary arteries. In September 2023, bisoprolol HCTZ was discontinued in the setting of lightheadedness and she was switched to metoprolol alone.     Since her last visit in October of 2024, Jill King has continued to do well. She does experience brief fluttering sensations or palpations at times but it only lasts a second or 2 and resolves spontaneously. She does not currently exercise but stays active at work. She works in a [employer] and is constantly moving. She denies any disruptions in activity tolerance. She denies any chest pain, dyspnea, PND, orthopnea, dizziness, syncope, edema, or early satiety.    Objective  Home Medications    Current Outpatient Medications  Medication Sig Dispense Refill   albuterol (PROVENTIL HFA) 108 (90 Base) MCG/ACT inhaler Inhale 2 puffs into the lungs once every 4 to 6 hours as needed. 6.7 g 2   alendronate (FOSAMAX) 70 MG tablet TAKE 1 TABLET BY MOUTH ONCE A WEEK ON SUNDAYS. 4 tablet 5   apixaban (ELIQUIS) 5 MG TABS tablet TAKE 1 TABLET BY MOUTH TWICE  DAILY 180 tablet 1   Budeson-Glycopyrrol-Formoterol (BREZTRI AEROSPHERE) 160-9-4.8 MCG/ACT AERO Inhale 2 puffs into the lungs 2 (two) times daily. 10.7 g 5   esomeprazole (NEXIUM) 40 MG capsule Take 40 mg by mouth daily at 12 noon.     fenofibrate (TRICOR) 48 MG tablet TAKE 1 TABLET BY MOUTH ONCE A DAY 90 tablet 2   linaclotide (LINZESS) 290 MCG CAPS capsule TAKE 1 CAPSULE BY MOUTH ONCE DAILY BEFORE BREAKFAST 90 capsule 0   metoprolol tartrate (LOPRESSOR) 25 MG tablet TAKE  1 TABLET(25 MG) BY MOUTH TWICE DAILY 180 tablet 3   nystatin (MYCOSTATIN) 100000 UNIT/ML suspension Take 5 mLs (500,000 Units total) by mouth 4 (four) times daily. 240 mL 0   promethazine-dextromethorphan (PROMETHAZINE-DM) 6.25-15 MG/5ML syrup Take 5 mLs by mouth 4 (four) times daily as needed. 118 mL 0   atorvastatin (LIPITOR) 20 MG tablet Take 1 tablet (20 mg total) by mouth daily. (Patient not taking: Reported on 09/20/2023) 90 tablet 1   No current facility-administered medications for this visit.     Physical Exam    VS:  BP 122/64   Pulse 78   Ht 5\' 5"  (1.651 m)   Wt 129 lb 12.8 oz (58.9 kg)   SpO2 96%   BMI 21.60 kg/m  , BMI Body mass index is 21.6 kg/m.       GEN: Well nourished, well developed, in no acute distress. HEENT: normal. Neck: Supple, no JVD, carotid bruits, or masses. Cardiac: RRR, no murmurs, rubs, or gallops. No clubbing, cyanosis, edema.  Radials 2+/PT 2+ and equal bilaterally.  Respiratory:  Respirations regular and unlabored, clear to auscultation bilaterally. GI: Soft, nontender, nondistended, BS + x 4. MS: no deformity or atrophy. Skin: warm and dry, no rash. Neuro:  Strength and sensation are intact. Psych: Normal affect.  Accessory Clinical Findings   Lab Results  Component Value Date   WBC 5.9 08/16/2023   HGB 12.6 08/16/2023   HCT 37.8 08/16/2023   MCV 90.2 08/16/2023   PLT 306 08/16/2023   Lab Results  Component Value Date   CREATININE 0.60 08/16/2023   BUN 14 08/16/2023   NA 139 08/16/2023   K 4.1 08/16/2023   CL 103 08/16/2023   CO2 28 08/16/2023   Lab Results  Component Value Date   ALT 16 08/16/2023   AST 18 08/16/2023   GGT 91 (H) 10/19/2021   ALKPHOS 121 09/28/2022   BILITOT 0.4 08/16/2023   Lab Results  Component Value Date   CHOL 236 (H) 08/16/2023   HDL 70 08/16/2023   LDLCALC 132 (H) 08/16/2023   TRIG 204 (H) 08/16/2023   CHOLHDL 3.4 08/16/2023    Lab Results  Component Value Date   HGBA1C 5.5 08/16/2023       Assessment & Plan   1.  Paroxysmal atrial fibrillation: Discovered on monitoring over the summer of August 2023 with less than 1% burden.  She has been doing well with only brief episodes of palpitations lasting a few seconds throughout the week.  No sustained arrhythmias.  She remains active without symptoms or limitations.  Continue beta-blocker and Eliquis.    2. Primary Hypertension: BP today 122/64. Stable on beta-blocker therapy.   3. COPD: Quit Smoking 8 years ago. Lungs are clear on examination.  On breztri and albuterol.  4. Hyperlipidemia: Lipid panel was recently taken by a PCP in February. LDL 132 HDL 70. She was started on atorvastatin but began having myalgias and ceased taking it. Patient  resumed her Fenofibrate. Her coronary CTA showed a calcium score of 0 with nl cors.  Current 10 year ASCVD risk 3.9%. Continue Fenofibrate.  5. Disposition: Follow-up in clinic in 6 months or sooner if necessary.   Nicolasa Ducking, NP 09/20/2023, 2:16 PM

## 2023-10-10 ENCOUNTER — Other Ambulatory Visit: Payer: Self-pay | Admitting: Internal Medicine

## 2023-10-10 NOTE — Telephone Encounter (Unsigned)
 Copied from CRM [id number]. Topic: Clinical - Medication Refill >> Oct 10, 2023  9:16 AM Zipporah Him wrote: Most Recent Primary Care Visit:  Provider: Carollynn Cirri  Department: [facility]  Visit Type: ACUTE  Date: 08/26/2023  Medication: alendronate (FOSAMAX) 70 MG tablet  Has the patient contacted their pharmacy? Yes Call office  Is this the correct pharmacy for this prescription? Yes If no, delete pharmacy and type the correct one.  This is the patient's preferred pharmacy:  [facility] - [place], [place] - [address] [place] [address] Phone: [phone] Fax: [phone]   Has the prescription been filled recently? No  Is the patient out of the medication? Yes, out as of last night  Has the patient been seen for an appointment in the last year OR does the patient have an upcoming appointment? Yes  Can we respond through MyChart? No  Agent: Please be advised that Rx refills may take up to 3 business days. We ask that you follow-up with your pharmacy.

## 2023-10-10 NOTE — Telephone Encounter (Signed)
 Copied from CRM [id number]. Topic: Clinical - Medication Refill >> Oct 10, 2023  9:16 AM Zipporah Him wrote: Most Recent Primary Care Visit:  Provider: Carollynn Cirri  Department: [facility]  Visit Type: ACUTE  Date: 08/26/2023  Medication: alendronate (FOSAMAX) 70 MG tablet  Has the patient contacted their pharmacy? Yes Call office  Is this the correct pharmacy for this prescription? Yes If no, delete pharmacy and type the correct one.  This is the patient's preferred pharmacy:  [facility] - [place], [place] - [address] [place] [address] Phone: [phone] Fax: [phone]   Has the prescription been filled recently? No  Is the patient out of the medication? Yes, out as of last night  Has the patient been seen for an appointment in the last year OR does the patient have an upcoming appointment? Yes  Can we respond through MyChart? No  Agent: Please be advised that Rx refills may take up to 3 business days. We ask that you follow-up with your pharmacy.

## 2023-10-11 MED ORDER — ALENDRONATE SODIUM 70 MG PO TABS: ORAL_TABLET | ORAL | 5 refills | Status: AC

## 2023-10-11 NOTE — Telephone Encounter (Signed)
 Requested medication (s) are due for refill today: yes  Requested medication (s) are on the active medication list: yes rx expired  Last refill:  10/06/21  Future visit scheduled: yes  Notes to clinic:  needing labs   Requested Prescriptions  Pending Prescriptions Disp Refills   alendronate (FOSAMAX) 70 MG tablet 4 tablet 5    Sig: TAKE 1 TABLET BY MOUTH ONCE A WEEK ON SUNDAYS.     Endocrinology:  Bisphosphonates Failed - 10/11/2023 11:30 AM      Failed - Vitamin D in normal range and within 360 days    No results found for: "UE4540JW1", "XB1478GN5", "AO130QM5HQI", "25OHVITD3", "25OHVITD2", "25OHVITD1", "VD25OH"       Failed - Mg Level in normal range and within 360 days    Magnesium  Date Value Ref Range Status  01/25/2022 2.0 1.7 - 2.4 mg/dL Final    Comment:    Performed at [facility], [address] [place], [place] [address]         Failed - Phosphate in normal range and within 360 days    No results found for: "PHOS"       Failed - Bone Mineral Density or Dexa Scan completed in the last 2 years      Passed - Ca in normal range and within 360 days    Calcium  Date Value Ref Range Status  08/16/2023 10.0 8.6 - 10.4 mg/dL Final   Calcium, Total  Date Value Ref Range Status  03/14/2014 8.9 8.5 - 10.1 mg/dL Final         Passed - Cr in normal range and within 360 days    Creat  Date Value Ref Range Status  08/16/2023 0.60 0.50 - 1.05 mg/dL Final         Passed - eGFR is 30 or above and within 360 days    EGFR (African American)  Date Value Ref Range Status  03/14/2014 >60  Final   GFR calc Af Amer  Date Value Ref Range Status  12/18/2018 >60 >60 mL/min Final   EGFR (Non-African Amer.)  Date Value Ref Range Status  03/14/2014 >60  Final    Comment:    eGFR values <63mL/min/1.73 m2 may be an indication of chronic kidney disease (CKD). Calculated eGFR is useful in patients with stable renal function. The eGFR calculation will not be reliable  in acutely ill patients when serum creatinine is changing rapidly. It is not useful in  patients on dialysis. The eGFR calculation may not be applicable to patients at the low and high extremes of body sizes, pregnant women, and vegetarians.    GFR, Estimated  Date Value Ref Range Status  07/05/2022 >60 >60 mL/min Final    Comment:    (NOTE) Calculated using the CKD-EPI Creatinine Equation (2021)    eGFR  Date Value Ref Range Status  08/16/2023 103 > OR = 60 mL/min/1.34m2 Final  10/19/2021 72 >59 mL/min/1.73 Final         Passed - Valid encounter within last 12 months    Recent Outpatient Visits           1 month ago Oral thrush   [place] [facility] [place], Rankin Buzzard, NP   1 month ago Mixed hyperlipidemia   [place] [facility] [place], Rankin Buzzard, NP       Future Appointments             In 4 months [place], [place]  W, NP [facility], [facility]

## 2023-10-11 NOTE — Telephone Encounter (Signed)
 Requested medication (s) are due for refill today: expired medication date  Requested medication (s) are on the active medication list: yes   Last refill:  10/06/21 #4 tab 5 refills  Future visit scheduled: yes in 4 months  Notes to clinic:  expired medication date. Do you want to renew Rx? Patient out of medication      Requested Prescriptions  Pending Prescriptions Disp Refills   alendronate (FOSAMAX) 70 MG tablet 4 tablet 5    Sig: TAKE 1 TABLET BY MOUTH ONCE A WEEK ON SUNDAYS.     Endocrinology:  Bisphosphonates Failed - 10/11/2023 11:31 AM      Failed - Vitamin D in normal range and within 360 days    No results found for: "ZO1096EA5", "WU9811BJ4", "[id number]", "25OHVITD3", "25OHVITD2", "25OHVITD1", "VD25OH"       Failed - Mg Level in normal range and within 360 days    Magnesium  Date Value Ref Range Status  01/25/2022 2.0 1.7 - 2.4 mg/dL Final    Comment:    Performed at [facility], [address] [place], [place] [address]         Failed - Phosphate in normal range and within 360 days    No results found for: "PHOS"       Failed - Bone Mineral Density or Dexa Scan completed in the last 2 years      Passed - Ca in normal range and within 360 days    Calcium  Date Value Ref Range Status  08/16/2023 10.0 8.6 - 10.4 mg/dL Final   Calcium, Total  Date Value Ref Range Status  03/14/2014 8.9 8.5 - 10.1 mg/dL Final         Passed - Cr in normal range and within 360 days    Creat  Date Value Ref Range Status  08/16/2023 0.60 0.50 - 1.05 mg/dL Final         Passed - eGFR is 30 or above and within 360 days    EGFR (African American)  Date Value Ref Range Status  03/14/2014 >60  Final   GFR calc Af Amer  Date Value Ref Range Status  12/18/2018 >60 >60 mL/min Final   EGFR (Non-African Amer.)  Date Value Ref Range Status  03/14/2014 >60  Final    Comment:    eGFR values <78mL/min/1.73 m2 may be an indication of chronic kidney disease  (CKD). Calculated eGFR is useful in patients with stable renal function. The eGFR calculation will not be reliable in acutely ill patients when serum creatinine is changing rapidly. It is not useful in  patients on dialysis. The eGFR calculation may not be applicable to patients at the low and high extremes of body sizes, pregnant women, and vegetarians.    GFR, Estimated  Date Value Ref Range Status  07/05/2022 >60 >60 mL/min Final    Comment:    (NOTE) Calculated using the CKD-EPI Creatinine Equation (2021)    eGFR  Date Value Ref Range Status  08/16/2023 103 > OR = 60 mL/min/1.1m2 Final  10/19/2021 72 >59 mL/min/1.73 Final         Passed - Valid encounter within last 12 months    Recent Outpatient Visits           1 month ago Oral thrush    [facility] [place], Salvadore [place], NP   1 month ago Mixed hyperlipidemia    [facility] [place], Salvadore [place], [place]  Future Appointments             In 4 months [place], Rankin Buzzard, NP [facility], [facility]

## 2023-10-18 ENCOUNTER — Telehealth: Payer: Self-pay

## 2023-10-18 ENCOUNTER — Other Ambulatory Visit: Payer: Self-pay | Admitting: Gastroenterology

## 2023-10-18 NOTE — Telephone Encounter (Signed)
 Jill King (Jill King) Rx #: [id number] Need Help? Call us  at [phone] Status Shared Next Steps Complete the request below, then click "Send to Plan." Drug Breztri  Aerosphere 160-9-4.8MCG/ACT aerosol Thumbnail image of the form Form [facility] General Physician Certification Prior Authorization Form Phone: [phone] Fax: [phone] Original Claim Info 70

## 2023-10-20 ENCOUNTER — Encounter: Payer: Self-pay | Admitting: Internal Medicine

## 2023-10-20 ENCOUNTER — Telehealth: Payer: PRIVATE HEALTH INSURANCE | Admitting: Internal Medicine

## 2023-10-20 DIAGNOSIS — J029 Acute pharyngitis, unspecified: Secondary | ICD-10-CM | POA: Diagnosis not present

## 2023-10-20 MED ORDER — NYSTATIN 100000 UNIT/ML MT SUSP: 5.0000 mL | Freq: Four times a day (QID) | OROMUCOSAL | 0 refills | Status: DC

## 2023-10-20 NOTE — Progress Notes (Signed)
 Virtual Visit via Video Note  I connected with Jill King on 10/20/23 at  2:00 PM EDT by a video enabled telemedicine application and verified that I am speaking with the correct person using two identifiers.  Location: Patient: Home Provider: Office  Person's participating in this video call: Helayne Lo, NP-C and Porscha Axley   I discussed the limitations of evaluation and management by telemedicine and the availability of in person appointments. The patient expressed understanding and agreed to proceed.  History of Present Illness:  Discussed the use of AI scribe software for clinical note transcription with the patient, who gave verbal consent to proceed.   Jill King is a 63 year old female who presents with a sore throat and possible thrush.  She began experiencing a sore throat and difficulty swallowing yesterday morning. The sore throat persisted throughout the day, though it has improved slightly today. She has been using lozenges and gargling with salt water , which have provided some relief.  No upper airway symptoms such as headache, rhinorrhea, nasal congestion, postnasal drip, cough, or dyspnea. She notes slight hoarseness, which she attributes to allergies. No white coating on her tongue or white spots in her mouth.  She is currently using Breztri  and ensures to rinse her mouth with warm water  after each use. She has previously been prescribed diflucan  and nystatin  mouthwash for thrush.       Past Medical History:  Diagnosis Date   Alcohol use    Arthritis    hands, wrist   Asthma    Chest pain    a. 01/2022 Cor CTA: Ca2+ = 0. Nl cors.   COPD (chronic obstructive pulmonary disease) (HCC)    Emphysema of lung (HCC)    GERD (gastroesophageal reflux disease)    Eosinophilic esophagitis 2017   History of echocardiogram    a. 12/2021 Echo: EF 55-60%, no rwma, nl RV fxn.   Osteoporosis    PAF (paroxysmal atrial fibrillation) (HCC)    a. 01/2022 Zio: Predominantly  sinus rhythm, average 80 (56-104).  Less than 1% A-fib burden (80-150 bpm).  Isolated PVC/PACs. 11 brief atrial runs-->Eliquis  5 bid (CHA2DS2VASc= 2).   Wears dentures    full upper and lower    Current Outpatient Medications  Medication Sig Dispense Refill   albuterol  (PROVENTIL  HFA) 108 (90 Base) MCG/ACT inhaler Inhale 2 puffs into the lungs once every 4 to 6 hours as needed. 6.7 g 2   alendronate  (FOSAMAX ) 70 MG tablet TAKE 1 TABLET BY MOUTH ONCE A WEEK ON SUNDAYS. 4 tablet 5   apixaban  (ELIQUIS ) 5 MG TABS tablet TAKE 1 TABLET BY MOUTH TWICE DAILY 180 tablet 1   Budeson-Glycopyrrol-Formoterol (BREZTRI  AEROSPHERE) 160-9-4.8 MCG/ACT AERO Inhale 2 puffs into the lungs 2 (two) times daily. 10.7 g 5   esomeprazole (NEXIUM) 40 MG capsule Take 40 mg by mouth daily at 12 noon.     fenofibrate  (TRICOR ) 48 MG tablet TAKE 1 TABLET BY MOUTH ONCE A DAY 90 tablet 2   LINZESS  290 MCG CAPS capsule TAKE 1 CAPSULE BY MOUTH ONCE DAILY BEFORE BREAKFAST 90 capsule 0   metoprolol  tartrate (LOPRESSOR ) 25 MG tablet TAKE 1 TABLET(25 MG) BY MOUTH TWICE DAILY 180 tablet 3   nystatin  (MYCOSTATIN ) 100000 UNIT/ML suspension Take 5 mLs (500,000 Units total) by mouth 4 (four) times daily. 240 mL 0   promethazine -dextromethorphan  (PROMETHAZINE -DM) 6.25-15 MG/5ML syrup Take 5 mLs by mouth 4 (four) times daily as needed. 118 mL 0   No current facility-administered medications for  this visit.    No Known Allergies  Family History  Problem Relation Age of Onset   COPD Mother    Diabetes Father    Heart disease Father    CAD Brother     Social History   Socioeconomic History   Marital status: Single    Spouse name: Not on file   Number of children: Not on file   Years of education: Not on file   Highest education level: Not on file  Occupational History   Not on file  Tobacco Use   Smoking status: Former    Current packs/day: 0.00    Average packs/day: 1 pack/day for 30.0 years (30.0 ttl pk-yrs)    Types:  Cigarettes    Start date: 12/25/1984    Quit date: 12/26/2014    Years since quitting: 8.8   Smokeless tobacco: Never  Vaping Use   Vaping status: Never Used  Substance and Sexual Activity   Alcohol use: Yes    Comment: 1 drink /2 weeks   Drug use: No   Sexual activity: Not on file  Other Topics Concern   Not on file  Social History Narrative   Not on file   Social Drivers of Health   Financial Resource Strain: Not on file  Food Insecurity: Not on file  Transportation Needs: Not on file  Physical Activity: Not on file  Stress: Not on file  Social Connections: Not on file  Intimate Partner Violence: Not on file     Constitutional: Denies fever, malaise, fatigue, headache or abrupt weight changes.  HEENT: Pt reports sore throat. Denies eye pain, eye redness, ear pain, ringing in the ears, wax buildup, runny nose, nasal congestion, bloody nose. Respiratory: Denies difficulty breathing, shortness of breath, cough or sputum production.   Cardiovascular: Denies chest pain, chest tightness, palpitations or swelling in the hands or feet.   No other specific complaints in a complete review of systems (except as listed in HPI above).  Observations/Objective:   Wt Readings from Last 3 Encounters:  09/20/23 129 lb 12.8 oz (58.9 kg)  08/28/23 128 lb 15.5 oz (58.5 kg)  08/26/23 129 lb (58.5 kg)    General: Appears her stated age, in NAD. HEENT: Throat/Mouth: Teeth present, no white coating noted of tongue, unable to visualize posterior pharynx.  Pulmonary/Chest: Normal effort. No respiratory distress. Neurological: Alert and oriented.   BMET    Component Value Date/Time   NA 139 08/16/2023 1059   NA 138 10/19/2021 1346   NA 142 03/14/2014 0931   K 4.1 08/16/2023 1059   K 4.1 03/14/2014 0931   CL 103 08/16/2023 1059   CL 109 (H) 03/14/2014 0931   CO2 28 08/16/2023 1059   CO2 26 03/14/2014 0931   GLUCOSE 93 08/16/2023 1059   GLUCOSE 75 03/14/2014 0931   BUN 14 08/16/2023  1059   BUN 10 10/19/2021 1346   BUN 11 03/14/2014 0931   CREATININE 0.60 08/16/2023 1059   CALCIUM  10.0 08/16/2023 1059   CALCIUM  8.9 03/14/2014 0931   GFRNONAA >60 07/05/2022 1146   GFRNONAA >60 03/14/2014 0931   GFRAA >60 12/18/2018 2356   GFRAA >60 03/14/2014 0931    Lipid Panel     Component Value Date/Time   CHOL 236 (H) 08/16/2023 1059   TRIG 204 (H) 08/16/2023 1059   HDL 70 08/16/2023 1059   CHOLHDL 3.4 08/16/2023 1059   VLDL 27 03/04/2022 0913   LDLCALC 132 (H) 08/16/2023 1059    CBC  Component Value Date/Time   WBC 5.9 08/16/2023 1059   RBC 4.19 08/16/2023 1059   HGB 12.6 08/16/2023 1059   HGB 13.5 03/14/2014 0931   HCT 37.8 08/16/2023 1059   HCT 40.9 03/14/2014 0931   PLT 306 08/16/2023 1059   PLT 224 03/14/2014 0931   MCV 90.2 08/16/2023 1059   MCV 96 03/14/2014 0931   MCH 30.1 08/16/2023 1059   MCHC 33.3 08/16/2023 1059   RDW 11.7 08/16/2023 1059   RDW 11.9 03/14/2014 0931   LYMPHSABS 1.7 03/14/2014 0931   MONOABS 0.5 03/14/2014 0931   EOSABS 0.3 03/14/2014 0931   BASOSABS 0.1 03/14/2014 0931    Hgb A1C Lab Results  Component Value Date   HGBA1C 5.5 08/16/2023       Assessment and Plan:  Assessment and Plan    Sore throat Sore throat likely due to allergies or mild oral thrush. Unlikely streptococcal pharyngitis due to absence of fever. - Start over-the-counter antihistamine such as Claritin, Allegra, or Zyrtec once daily for two weeks. - Prescribe nystatin  mouthwash to keep on hand for potential thrush. - Advise follow-up in person if symptoms do not improve.    RTC in 4 months for your annual exam  Follow Up Instructions:    I discussed the assessment and treatment plan with the patient. The patient was provided an opportunity to ask questions and all were answered. The patient agreed with the plan and demonstrated an understanding of the instructions.   The patient was advised to call back or seek an in-person evaluation if  the symptoms worsen or if the condition fails to improve as anticipated.   Helayne Lo, NP

## 2023-10-20 NOTE — Patient Instructions (Signed)
 Sore Throat  When you have a sore throat, your throat may feel:  Tender.  Burning.  Irritated.  Scratchy.  Painful when you swallow.  Painful when you talk.  Many things can cause a sore throat, such as:  An infection.  Allergies.  Dry air.  Smoke or pollution.  Radiation treatment for cancer.  Gastroesophageal reflux disease (GERD).  A tumor.  A sore throat can be the first sign of another sickness. It can happen with other problems, like:  Coughing.  Sneezing.  Fever.  Swelling of the glands in the neck.  Most sore throats go away without treatment.  Follow these instructions at home:         Medicines  Take over-the-counter and prescription medicines only as told by your doctor.  Children often get sore throats. Do not give your child aspirin.  Use throat sprays to soothe your throat as told by your health care provider.  Managing pain  To help with pain:  Sip warm liquids, such as broth, herbal tea, or warm water.  Eat or drink cold or frozen liquids, such as frozen ice pops.  Rinse your mouth (gargle) with a salt water mixture 3-4 times a day or as needed.  To make salt water, dissolve -1 tsp (3-6 g) of salt in 1 cup (237 mL) of warm water.  Do not swallow this mixture.  Suck on hard candy or throat lozenges.  Put a cool-mist humidifier in your bedroom at night.  Sit in the bathroom with the door closed for 5-10 minutes while you run hot water in the shower.  General instructions  Do not smoke or use any products that contain nicotine or tobacco. If you need help quitting, ask your doctor.  Get plenty of rest.  Drink enough fluid to keep your pee (urine) pale yellow.  Wash your hands often for at least 20 seconds with soap and water. If soap and water are not available, use hand sanitizer.  Contact a doctor if:  You have a fever for more than 2-3 days.  You keep having symptoms for more than 2-3 days.  Your throat does not get better in 7 days.  You have a fever and your symptoms suddenly get worse.  Your  child who is 3 months to 31 years old has a temperature of 102.71F (39C) or higher.  Get help right away if:  You have trouble breathing.  You cannot swallow fluids, soft foods, or your spit.  You have swelling in your throat or neck that gets worse.  You feel like you may vomit (nauseous) and this feeling lasts a long time.  You cannot stop vomiting.  These symptoms may be an emergency. Get help right away. Call your local emergency services (911 in the [place]  Do not wait to see if the symptoms will go away.  Do not drive yourself to the hospital.  Summary  A sore throat is a painful, burning, irritated, or scratchy throat. Many things can cause a sore throat.  Take over-the-counter medicines only as told by your doctor.  Get plenty of rest.  Drink enough fluid to keep your pee (urine) pale yellow.  Contact a doctor if your symptoms get worse or your sore throat does not get better within 7 days.  This information is not intended to replace advice given to you by your health care provider. Make sure you discuss any questions you have with your health care provider.  Document Revised: 09/10/2020 Document  Reviewed: 09/10/2020  Elsevier Patient Education  2024 [employer].

## 2023-10-20 NOTE — Telephone Encounter (Signed)
 Yes, she will need an appointment.  Virtual is fine.

## 2023-11-14 ENCOUNTER — Other Ambulatory Visit: Payer: Self-pay | Admitting: Internal Medicine

## 2023-11-14 DIAGNOSIS — E782 Mixed hyperlipidemia: Secondary | ICD-10-CM

## 2023-11-15 ENCOUNTER — Telehealth: Payer: Self-pay

## 2023-11-15 ENCOUNTER — Other Ambulatory Visit: Payer: PRIVATE HEALTH INSURANCE

## 2023-11-15 DIAGNOSIS — E782 Mixed hyperlipidemia: Secondary | ICD-10-CM

## 2023-11-15 NOTE — Telephone Encounter (Signed)
 Noted

## 2023-11-15 NOTE — Telephone Encounter (Signed)
 Patient was in for labs this morning and wanted to make you aware of some medication changes. She states she stopped taking the Lipitor due to side effects - sleeping a lot, not feeling well. She states she is still taking the Fenofibrate  though. Thanks!

## 2023-11-16 ENCOUNTER — Ambulatory Visit: Payer: Self-pay | Admitting: Internal Medicine

## 2023-11-16 LAB — LIPID PANEL
Cholesterol: 210 mg/dL — ABNORMAL HIGH (ref <200)
HDL: 70 mg/dL (ref >=50)
LDL Cholesterol (Calc): 115 mg/dL — ABNORMAL HIGH
Non-HDL Cholesterol (Calc): 140 mg/dL — ABNORMAL HIGH (ref <130)
Total CHOL/HDL Ratio: 3 (calc) (ref <5.0)
Triglycerides: 141 mg/dL (ref <150)

## 2023-12-09 ENCOUNTER — Other Ambulatory Visit: Payer: Self-pay

## 2023-12-09 DIAGNOSIS — I4891 Unspecified atrial fibrillation: Secondary | ICD-10-CM

## 2023-12-09 MED ORDER — BREZTRI AEROSPHERE 160-9-4.8 MCG/ACT IN AERO: 2.0000 | INHALATION_SPRAY | Freq: Two times a day (BID) | RESPIRATORY_TRACT | 5 refills | Status: DC

## 2023-12-09 MED ORDER — APIXABAN 5 MG PO TABS: 5.0000 mg | ORAL_TABLET | Freq: Two times a day (BID) | ORAL | 1 refills | Status: DC

## 2023-12-09 NOTE — Telephone Encounter (Signed)
 Prescription refill request for Eliquis  received. Indication:AFIB Last office visit:3/25 Scr:0.60  2/25 Age: 63 Weight:58.9  KG  PRESCRIPTION REFILLED

## 2023-12-19 ENCOUNTER — Encounter: Payer: Self-pay | Admitting: Internal Medicine

## 2023-12-19 MED ORDER — FENOFIBRATE 48 MG PO TABS: ORAL_TABLET | Freq: Every day | ORAL | 1 refills | Status: DC

## 2024-01-01 ENCOUNTER — Ambulatory Visit (INDEPENDENT_AMBULATORY_CARE_PROVIDER_SITE_OTHER): Payer: PRIVATE HEALTH INSURANCE

## 2024-01-01 ENCOUNTER — Ambulatory Visit: Payer: Self-pay | Admitting: Physician Assistant

## 2024-01-01 ENCOUNTER — Ambulatory Visit
Admission: RE | Admit: 2024-01-01 | Discharge: 2024-01-01 | Disposition: A | Discharge status: Home / Self Care | Payer: PRIVATE HEALTH INSURANCE | Source: Ambulatory Visit | Attending: Physician Assistant | Admitting: Physician Assistant

## 2024-01-01 VITALS — BP 142/71 | HR 69 | Temp 97.9°F | Resp 16 | Ht 65.0 in | Wt 129.9 lb

## 2024-01-01 DIAGNOSIS — M79672 Pain in left foot: Secondary | ICD-10-CM

## 2024-01-01 DIAGNOSIS — M7989 Other specified soft tissue disorders: Secondary | ICD-10-CM | POA: Diagnosis not present

## 2024-01-01 LAB — URIC ACID: Uric Acid, Serum: 5.2 mg/dL (ref 2.5–7.1)

## 2024-01-01 MED ORDER — PREDNISONE 10 MG PO TABS: ORAL_TABLET | ORAL | 0 refills | Status: DC

## 2024-01-01 NOTE — ED Provider Notes (Signed)
 [facility]    CSN: [id number] Arrival date & time: 01/01/24  0945      History   Chief Complaint Chief Complaint  Patient presents with   Toe Pain    Left foot   Appointment    HPI Jill King is a 63 y.o. female presenting for pain of the left foot, primarily the 2nd and 3rd toes as well as plantar forefoot.  Patient says the pain was sharp and stabbing when she was walking.  She denies any fall or trauma.  Denies insect bite or sting.  States she was wearing tennis shoes and denies them being too tight.  No change to her physical activity.  No history of gout.  There is associated swelling present.  No wounds.  Has taken Tylenol  without relief.  States she cannot take NSAIDs due to being on Eliquis .  No numbness, weakness.  No other complaints.  HPI  Past Medical History:  Diagnosis Date   Alcohol use    Arthritis    hands, wrist   Asthma    Chest pain    a. 01/2022 Cor CTA: Ca2+ = 0. Nl cors.   COPD (chronic obstructive pulmonary disease) (HCC)    Emphysema of lung (HCC)    GERD (gastroesophageal reflux disease)    Eosinophilic esophagitis 2017   History of echocardiogram    a. 12/2021 Echo: EF 55-60%, no rwma, nl RV fxn.   Osteoporosis    PAF (paroxysmal atrial fibrillation) (HCC)    a. 01/2022 Zio: Predominantly sinus rhythm, average 80 (56-104).  Less than 1% A-fib burden (80-150 bpm).  Isolated PVC/PACs. 11 brief atrial runs-->Eliquis  5 bid (CHA2DS2VASc= 2).   Wears dentures    full upper and lower    Patient Active Problem List   Diagnosis Date Noted   Aortic atherosclerosis (HCC) 08/16/2023   Chronic constipation 08/16/2023   Osteoporosis 08/16/2023   New onset atrial fibrillation (HCC): CHA2DS2Vasc = 3 (HTN, F, Ao Atherosclerosis). 02/18/2022   Eosinophilic esophagitis 01/25/2022   Chronic back pain 01/25/2022   HLD (hyperlipidemia) 01/25/2022   HTN (hypertension) 01/25/2022   COPD (chronic obstructive pulmonary disease) (HCC) 01/01/2016    Asthma 01/01/2016   GERD (gastroesophageal reflux disease) 01/01/2016    Past Surgical History:  Procedure Laterality Date   CESAREAN SECTION     COLONOSCOPY WITH PROPOFOL  N/A 01/08/2016   Procedure: COLONOSCOPY WITH PROPOFOL ;  Surgeon: Rogelia Copping, MD;  Location: [facility];  Service: Endoscopy;  Laterality: N/A;   ESOPHAGOGASTRODUODENOSCOPY (EGD) WITH PROPOFOL  N/A 01/08/2016   Procedure: ESOPHAGOGASTRODUODENOSCOPY (EGD) WITH PROPOFOL ;  Surgeon: Rogelia Copping, MD;  Location: [facility];  Service: Endoscopy;  Laterality: N/A;   MULTIPLE TOOTH EXTRACTIONS     POLYPECTOMY  01/08/2016   Procedure: POLYPECTOMY;  Surgeon: Rogelia Copping, MD;  Location: [facility];  Service: Endoscopy;;    OB History   No obstetric history on file.      Home Medications    Prior to Admission medications   Medication Sig Start Date End Date Taking? Authorizing Provider  predniSONE  (DELTASONE ) 10 MG tablet Take 6 tabs po on day 1 and reduce by 1 tab daily until complete 01/01/24  Yes Arvis Huxley B, PA-C  albuterol  (PROVENTIL  HFA) 108 (90 Base) MCG/ACT inhaler Inhale 2 puffs into the lungs once every 4 to 6 hours as needed. 01/26/22   Fausto Sor A, DO  alendronate  (FOSAMAX ) 70 MG tablet TAKE 1 TABLET BY MOUTH ONCE A WEEK ON SUNDAYS. 10/11/23  Antonette Angeline ORN, NP  apixaban  (ELIQUIS ) 5 MG TABS tablet Take 1 tablet (5 mg total) by mouth 2 (two) times daily. 12/09/23   Anner Alm ORN, MD  budesonide-glycopyrrolate -formoterol (BREZTRI  AEROSPHERE) 160-9-4.8 MCG/ACT AERO inhaler Inhale 2 puffs into the lungs 2 (two) times daily. 12/09/23   Antonette Angeline ORN, NP  esomeprazole (NEXIUM) 40 MG capsule Take 40 mg by mouth daily at 12 noon.    [provider]  fenofibrate  (TRICOR ) 48 MG tablet TAKE 1 TABLET BY MOUTH ONCE A DAY 12/19/23   Antonette Angeline ORN, NP  LINZESS  290 MCG CAPS capsule TAKE 1 CAPSULE BY MOUTH ONCE DAILY BEFORE BREAKFAST 10/18/23   Jinny Carmine, MD  metoprolol  tartrate  (LOPRESSOR ) 25 MG tablet TAKE 1 TABLET(25 MG) BY MOUTH TWICE DAILY 09/14/22   Anner Alm ORN, MD  nystatin  (MYCOSTATIN ) 100000 UNIT/ML suspension Take 5 mLs (500,000 Units total) by mouth 4 (four) times daily. 10/20/23   Antonette Angeline ORN, NP  promethazine -dextromethorphan  (PROMETHAZINE -DM) 6.25-15 MG/5ML syrup Take 5 mLs by mouth 4 (four) times daily as needed. 08/28/23   Arvis Jolan NOVAK, PA-C    Family History Family History  Problem Relation Age of Onset   COPD Mother    Diabetes Father    Heart disease Father    CAD Brother     Social History Social History   Tobacco Use   Smoking status: Former    Current packs/day: 0.00    Average packs/day: 1 pack/day for 30.0 years (30.0 ttl pk-yrs)    Types: Cigarettes    Start date: 12/25/1984    Quit date: 12/26/2014    Years since quitting: 9.0   Smokeless tobacco: Never  Vaping Use   Vaping status: Never Used  Substance Use Topics   Alcohol use: Yes    Comment: 1 drink /2 weeks   Drug use: No     Allergies   Patient has no known allergies.   Review of Systems Review of Systems  Musculoskeletal:  Positive for arthralgias and joint swelling.  Skin:  Negative for color change and wound.  Neurological:  Negative for weakness and numbness.     Physical Exam Triage Vital Signs ED Triage Vitals  Encounter Vitals Group     BP      Girls Systolic BP Percentile      Girls Diastolic BP Percentile      Boys Systolic BP Percentile      Boys Diastolic BP Percentile      Pulse      Resp      Temp      Temp src      SpO2      Weight      Height      Head Circumference      Peak Flow      Pain Score      Pain Loc      Pain Education      Exclude from Growth Chart    No data found.  Updated Vital Signs BP (!) 142/71 (BP Location: Left Arm)   Pulse 69   Temp 97.9 F (36.6 C) (Oral)   Resp 16   Ht 5' 5 (1.651 m)   Wt 129 lb 13.6 oz (58.9 kg)   SpO2 100%   BMI 21.61 kg/m   Physical Exam Vitals and nursing note  reviewed.  Constitutional:      General: She is not in acute distress.    Appearance: Normal appearance. She is not  ill-appearing or toxic-appearing.  HENT:     Head: Normocephalic and atraumatic.  Eyes:     General: No scleral icterus.       Right eye: No discharge.        Left eye: No discharge.     Conjunctiva/sclera: Conjunctivae normal.  Cardiovascular:     Rate and Rhythm: Normal rate.     Pulses: Normal pulses.  Pulmonary:     Effort: Pulmonary effort is normal. No respiratory distress.  Musculoskeletal:     Cervical back: Neck supple.     Comments: LEFT FOOT: There is mild swelling dorsal second and third toes as well as throughout the plantar forefoot. All swollen areas are TTP. No wounds  Skin:    General: Skin is dry.  Neurological:     General: No focal deficit present.     Mental Status: She is alert. Mental status is at baseline.     Motor: No weakness.     Gait: Gait normal.  Psychiatric:        Mood and Affect: Mood normal.        Behavior: Behavior normal.      UC Treatments / Results  Labs (all labs ordered are listed, but only abnormal results are displayed) Labs Reviewed  URIC ACID    EKG   Radiology DG Foot Complete Left Result Date: 01/01/2024 CLINICAL DATA:  Left foot and toe pain for 3 days EXAM: LEFT FOOT - COMPLETE 3+ VIEW COMPARISON:  03/07/2013 FINDINGS: Frontal, oblique, and lateral views of the left foot are obtained. No acute fracture, subluxation, or dislocation. Joint spaces are relatively well preserved. Soft tissues are normal. IMPRESSION: 1. Unremarkable left foot. Electronically Signed   By: Ozell Daring M.D.   On: 01/01/2024 10:52    Procedures Procedures (including critical care time)  Medications Ordered in UC Medications - No data to display  Initial Impression / Assessment and Plan / UC Course  I have reviewed the triage vital signs and the nursing notes.  Pertinent labs & imaging results that were available during my  care of the patient were reviewed by me and considered in my medical decision making (see chart for details).   63 year old female presents for left foot pain and swelling for the past couple of days.  Denies injury or insect bite.  No history of gout or foot conditions.  X-ray of left foot obtained is normal.  Will obtain uric acid as patient does drink alcohol and says she is partial to seafood and red meats.  Will contact patient if uric acid level is elevated.  Explained if uric acid level is elevated this is a good indicator for gout but if it is normal and does not 100 percent rule it out.  We discussed dietary changes.  Will treat swelling at this time with prednisone .  Continue Tylenol .  Cryotherapy and elevation.  X-ray over read negative.  Uric acid level within normal limit.  PCP follow-up was encouraged.   Final Clinical Impressions(s) / UC Diagnoses   Final diagnoses:  Left foot pain  Swelling of left foot     Discharge Instructions      - I will call if uric acid level is elevated.  This would indicate possible gout.  If it is normal it does not rule it out.  Will need to change diet to avoid further flareups if gout.  Reduce consumption of alcohol, seafood and red meat. - I sent prednisone  to pharmacy to help  with swelling and inflammation.  You can also take Tylenol , elevate and ice your foot. - I will contact you if the radiologist sees anything abnormal on the x-ray but I did not.      ED Prescriptions     Medication Sig Dispense Auth. Provider   predniSONE  (DELTASONE ) 10 MG tablet Take 6 tabs po on day 1 and reduce by 1 tab daily until complete 21 tablet Arvis Jolan NOVAK, PA-C      PDMP not reviewed this encounter.   Arvis Jolan NOVAK, PA-C 01/01/24 1202

## 2024-01-01 NOTE — Discharge Instructions (Addendum)
-   I will call if uric acid level is elevated.  This would indicate possible gout.  If it is normal it does not rule it out.  Will need to change diet to avoid further flareups if gout.  Reduce consumption of alcohol, seafood and red meat. - I sent prednisone  to pharmacy to help with swelling and inflammation.  You can also take Tylenol , elevate and ice your foot. - I will contact you if the radiologist sees anything abnormal on the x-ray but I did not.

## 2024-01-01 NOTE — ED Triage Notes (Signed)
 Pt c/o left foot and toe pain. Started about 3 days ago. She states she was walking outside to take the trash out when it started but does not recall an injury. She states her 2-4th toes are swollen.

## 2024-01-11 ENCOUNTER — Telehealth: Payer: Self-pay | Admitting: Pharmacy Technician

## 2024-01-11 ENCOUNTER — Other Ambulatory Visit (HOSPITAL_COMMUNITY): Payer: Self-pay

## 2024-01-11 NOTE — Telephone Encounter (Signed)
 Patient has to use the [facility] mail order program  RX's are faxed to [phone]  PA not needed but must use mail order.  Patient was mailed out a shipment on 01/06/24- company reaching out to patient to confirm shipment

## 2024-01-16 ENCOUNTER — Other Ambulatory Visit: Payer: Self-pay | Admitting: Gastroenterology

## 2024-02-15 ENCOUNTER — Ambulatory Visit (INDEPENDENT_AMBULATORY_CARE_PROVIDER_SITE_OTHER): Payer: PRIVATE HEALTH INSURANCE | Admitting: Internal Medicine

## 2024-02-15 ENCOUNTER — Encounter: Payer: Self-pay | Admitting: Internal Medicine

## 2024-02-15 VITALS — BP 138/74 | Ht 65.0 in | Wt 129.0 lb

## 2024-02-15 DIAGNOSIS — Z23 Encounter for immunization: Secondary | ICD-10-CM | POA: Diagnosis not present

## 2024-02-15 DIAGNOSIS — Z1231 Encounter for screening mammogram for malignant neoplasm of breast: Secondary | ICD-10-CM

## 2024-02-15 DIAGNOSIS — Z0001 Encounter for general adult medical examination with abnormal findings: Secondary | ICD-10-CM

## 2024-02-15 DIAGNOSIS — Z780 Asymptomatic menopausal state: Secondary | ICD-10-CM | POA: Diagnosis not present

## 2024-02-15 MED ORDER — ALBUTEROL SULFATE HFA 108 (90 BASE) MCG/ACT IN AERS: INHALATION_SPRAY | RESPIRATORY_TRACT | 2 refills | Status: DC

## 2024-02-15 NOTE — Progress Notes (Signed)
 Subjective:    Patient ID: Jill King, female    DOB: Jun 05, 1961, 63 y.o.   MRN: [id number]  HPI  Patient presents to clinic today for her annual exam.  Flu: never Tetanus: 12/2021 COVID: x 2 Pneumovax: 03/2016 Prevnar: never Shingrix: never Pap smear: > 5 years ago Mammogram: > 2 years Bone density: never Colon screening: 12/2015 Vision screening: as needed Dentist: as needed  Diet: She does eat meat. She consumes some fruits and veggies. She does eat fried foods. She drinks mostly Kool Aid, some water  Exercise: Walking   Review of Systems   Past Medical History:  Diagnosis Date   Alcohol use    Arthritis    hands, wrist   Asthma    Chest pain    a. 01/2022 Cor CTA: Ca2+ = 0. Nl cors.   COPD (chronic obstructive pulmonary disease) (HCC)    Emphysema of lung (HCC)    GERD (gastroesophageal reflux disease)    Eosinophilic esophagitis 2017   History of echocardiogram    a. 12/2021 Echo: EF 55-60%, no rwma, nl RV fxn.   Osteoporosis    PAF (paroxysmal atrial fibrillation) (HCC)    a. 01/2022 Zio: Predominantly sinus rhythm, average 80 (56-104).  Less than 1% A-fib burden (80-150 bpm).  Isolated PVC/PACs. 11 brief atrial runs-->Eliquis  5 bid (CHA2DS2VASc= 2).   Wears dentures    full upper and lower    Current Outpatient Medications  Medication Sig Dispense Refill   albuterol  (PROVENTIL  HFA) 108 (90 Base) MCG/ACT inhaler Inhale 2 puffs into the lungs once every 4 to 6 hours as needed. 6.7 g 2   alendronate  (FOSAMAX ) 70 MG tablet TAKE 1 TABLET BY MOUTH ONCE A WEEK ON SUNDAYS. 4 tablet 5   apixaban  (ELIQUIS ) 5 MG TABS tablet Take 1 tablet (5 mg total) by mouth 2 (two) times daily. 180 tablet 1   budesonide-glycopyrrolate -formoterol (BREZTRI  AEROSPHERE) 160-9-4.8 MCG/ACT AERO inhaler Inhale 2 puffs into the lungs 2 (two) times daily. 10.7 g 5   esomeprazole (NEXIUM) 40 MG capsule Take 40 mg by mouth daily at 12 noon.     fenofibrate  (TRICOR ) 48 MG tablet TAKE 1 TABLET  BY MOUTH ONCE A DAY 90 tablet 1   LINZESS  290 MCG CAPS capsule TAKE 1 CAPSULE BY MOUTH ONCE DAILY BEFORE BREAKFAST 90 capsule 0   metoprolol  tartrate (LOPRESSOR ) 25 MG tablet TAKE 1 TABLET(25 MG) BY MOUTH TWICE DAILY 180 tablet 3   nystatin  (MYCOSTATIN ) 100000 UNIT/ML suspension Take 5 mLs (500,000 Units total) by mouth 4 (four) times daily. 473 mL 0   predniSONE  (DELTASONE ) 10 MG tablet Take 6 tabs po on day 1 and reduce by 1 tab daily until complete 21 tablet 0   promethazine -dextromethorphan  (PROMETHAZINE -DM) 6.25-15 MG/5ML syrup Take 5 mLs by mouth 4 (four) times daily as needed. 118 mL 0   No current facility-administered medications for this visit.    No Known Allergies  Family History  Problem Relation Age of Onset   COPD Mother    Diabetes Father    Heart disease Father    CAD Brother     Social History   Socioeconomic History   Marital status: Single    Spouse name: Not on file   Number of children: Not on file   Years of education: Not on file   Highest education level: Not on file  Occupational History   Not on file  Tobacco Use   Smoking status: Former    Current packs/day: 0.00  Average packs/day: 1 pack/day for 30.0 years (30.0 ttl pk-yrs)    Types: Cigarettes    Start date: 12/25/1984    Quit date: 12/26/2014    Years since quitting: 9.1   Smokeless tobacco: Never  Vaping Use   Vaping status: Never Used  Substance and Sexual Activity   Alcohol use: Yes    Comment: 1 drink /2 weeks   Drug use: No   Sexual activity: Not on file  Other Topics Concern   Not on file  Social History Narrative   Not on file   Social Drivers of Health   Financial Resource Strain: Not on file  Food Insecurity: Not on file  Transportation Needs: Not on file  Physical Activity: Not on file  Stress: Not on file  Social Connections: Not on file  Intimate Partner Violence: Not on file     Constitutional: Denies fever, malaise, fatigue, headache or abrupt weight changes.   HEENT: Denies eye pain, eye redness, ear pain, ringing in the ears, wax buildup, runny nose, nasal congestion, bloody nose, or sore throat. Respiratory: Patient reports chronic shortness of breath.  Denies difficulty breathing, cough or sputum production.   Cardiovascular: Denies chest pain, chest tightness, palpitations or swelling in the hands or feet.  Gastrointestinal: Pt reports chronic constipation. Denies abdominal pain, bloating, diarrhea or blood in the stool.  GU: Denies urgency, frequency, pain with urination, burning sensation, blood in urine, odor or discharge. Musculoskeletal: Pt reports chronic back pain, muscle cramps in legs. Denies decrease in range of motion, difficulty with gait, muscle pain or joint swelling.  Skin: Denies redness, rashes, lesions or ulcercations.  Neurological: Denies dizziness, difficulty with memory, difficulty with speech or problems with balance and coordination.  Psych: Denies anxiety, depression, SI/HI.  No other specific complaints in a complete review of systems (except as listed in HPI above).      Objective:   Physical Exam  BP (!) 148/78 (BP Location: Right Arm, Patient Position: Sitting, Cuff Size: Normal)   Ht 5' 5 (1.651 m)   Wt 129 lb (58.5 kg)   BMI 21.47 kg/m   Wt Readings from Last 3 Encounters:  01/01/24 129 lb 13.6 oz (58.9 kg)  09/20/23 129 lb 12.8 oz (58.9 kg)  08/28/23 128 lb 15.5 oz (58.5 kg)    General: Appears her stated age, well developed, well nourished in NAD. Skin: Warm, dry and intact.  HEENT: Head: normal shape and size; Eyes: sclera white, no icterus, conjunctiva pink, PERRLA and EOMs intact;  Neck:  Neck supple, trachea midline. No masses, lumps or thyromegaly present.  Cardiovascular: Normal rate and rhythm. S1,S2 noted.  No murmur, rubs or gallops noted. No JVD or BLE edema. No carotid bruits noted. Pulmonary/Chest: Normal effort and positive vesicular breath sounds. No respiratory distress. No wheezes,  rales or ronchi noted.  Abdomen: Normal bowel sounds.  Musculoskeletal: Strength 5/5 BUE/BLE.  No difficulty with gait.  Neurological: Alert and oriented. Cranial nerves II-XII grossly intact. Coordination normal.  Psychiatric: Mood and affect normal. Behavior is normal. Judgment and thought content normal.    BMET    Component Value Date/Time   NA 139 08/16/2023 1059   NA 138 10/19/2021 1346   NA 142 03/14/2014 0931   K 4.1 08/16/2023 1059   K 4.1 03/14/2014 0931   CL 103 08/16/2023 1059   CL 109 (H) 03/14/2014 0931   CO2 28 08/16/2023 1059   CO2 26 03/14/2014 0931   GLUCOSE 93 08/16/2023 1059  GLUCOSE 75 03/14/2014 0931   BUN 14 08/16/2023 1059   BUN 10 10/19/2021 1346   BUN 11 03/14/2014 0931   CREATININE 0.60 08/16/2023 1059   CALCIUM  10.0 08/16/2023 1059   CALCIUM  8.9 03/14/2014 0931   GFRNONAA >60 07/05/2022 1146   GFRNONAA >60 03/14/2014 0931   GFRAA >60 12/18/2018 2356   GFRAA >60 03/14/2014 0931    Lipid Panel     Component Value Date/Time   CHOL 210 (H) 11/15/2023 0829   TRIG 141 11/15/2023 0829   HDL 70 11/15/2023 0829   CHOLHDL 3.0 11/15/2023 0829   VLDL 27 03/04/2022 0913   LDLCALC 115 (H) 11/15/2023 0829    CBC    Component Value Date/Time   WBC 5.9 08/16/2023 1059   RBC 4.19 08/16/2023 1059   HGB 12.6 08/16/2023 1059   HGB 13.5 03/14/2014 0931   HCT 37.8 08/16/2023 1059   HCT 40.9 03/14/2014 0931   PLT 306 08/16/2023 1059   PLT 224 03/14/2014 0931   MCV 90.2 08/16/2023 1059   MCV 96 03/14/2014 0931   MCH 30.1 08/16/2023 1059   MCHC 33.3 08/16/2023 1059   RDW 11.7 08/16/2023 1059   RDW 11.9 03/14/2014 0931   LYMPHSABS 1.7 03/14/2014 0931   MONOABS 0.5 03/14/2014 0931   EOSABS 0.3 03/14/2014 0931   BASOSABS 0.1 03/14/2014 0931    Hgb A1C Lab Results  Component Value Date   HGBA1C 5.5 08/16/2023            Assessment & Plan:   Preventative Health Maintenance:  Encouraged her to get a flu shot in the fall Tetanus  UTD Encouraged her to get a covid vaccine Prevnar 20 today Discusses shignrix vaccine, she will check coverage with her insurance company and scheduled a visit if she would like to have this done Pap smear declined Mammogram and bone density ordered- she will call you to schedule She declines lung cancer screening Colon screening UTD Encouraged her to consume a balanced diet and exercise regimen Advised her to see an eye doctor and dentist annually She declines labs today  RTC in 6 months followup chronic conditions Angeline Laura, NP

## 2024-02-15 NOTE — Patient Instructions (Signed)
 Health Maintenance for Postmenopausal Women Menopause is a normal process in which your ability to get pregnant comes to an end. This process happens slowly over many months or years, usually between the ages of 76 and 38. Menopause is complete when you have missed your menstrual period for 12 months. It is important to talk with your health care provider about some of the most common conditions that affect women after menopause (postmenopausal women). These include heart disease, cancer, and bone loss (osteoporosis). Adopting a healthy lifestyle and getting preventive care can help to promote your health and wellness. The actions you take can also lower your chances of developing some of these common conditions. What are the signs and symptoms of menopause? During menopause, you may have the following symptoms: Hot flashes. These can be moderate or severe. Night sweats. Decrease in sex drive. Mood swings. Headaches. Tiredness (fatigue). Irritability. Memory problems. Problems falling asleep or staying asleep. Talk with your health care provider about treatment options for your symptoms. Do I need hormone replacement therapy? Hormone replacement therapy is effective in treating symptoms that are caused by menopause, such as hot flashes and night sweats. Hormone replacement carries certain risks, especially as you become older. If you are thinking about using estrogen or estrogen with progestin, discuss the benefits and risks with your health care provider. How can I reduce my risk for heart disease and stroke? The risk of heart disease, heart attack, and stroke increases as you age. One of the causes may be a change in the body's hormones during menopause. This can affect how your body uses dietary fats, triglycerides, and cholesterol. Heart attack and stroke are medical emergencies. There are many things that you can do to help prevent heart disease and stroke. Watch your blood pressure High  blood pressure causes heart disease and increases the risk of stroke. This is more likely to develop in people who have high blood pressure readings or are overweight. Have your blood pressure checked: Every 3-5 years if you are 32-23 years of age. Every year if you are 31 years old or older. Eat a healthy diet  Eat a diet that includes plenty of vegetables, fruits, low-fat dairy products, and lean protein. Do not eat a lot of foods that are high in solid fats, added sugars, or sodium. Get regular exercise Get regular exercise. This is one of the most important things you can do for your health. Most adults should: Try to exercise for at least 150 minutes each week. The exercise should increase your heart rate and make you sweat (moderate-intensity exercise). Try to do strengthening exercises at least twice each week. Do these in addition to the moderate-intensity exercise. Spend less time sitting. Even light physical activity can be beneficial. Other tips Work with your health care provider to achieve or maintain a healthy weight. Do not use any products that contain nicotine or tobacco. These products include cigarettes, chewing tobacco, and vaping devices, such as e-cigarettes. If you need help quitting, ask your health care provider. Know your numbers. Ask your health care provider to check your cholesterol and your blood sugar (glucose). Continue to have your blood tested as directed by your health care provider. Do I need screening for cancer? Depending on your health history and family history, you may need to have cancer screenings at different stages of your life. This may include screening for: Breast cancer. Cervical cancer. Lung cancer. Colorectal cancer. What is my risk for osteoporosis? After menopause, you may be  at increased risk for osteoporosis. Osteoporosis is a condition in which bone destruction happens more quickly than new bone creation. To help prevent osteoporosis or  the bone fractures that can happen because of osteoporosis, you may take the following actions: If you are 24-54 years old, get at least 1,000 mg of calcium and at least 600 international units (IU) of vitamin D  per day. If you are older than age 75 but younger than age 30, get at least 1,200 mg of calcium and at least 600 international units (IU) of vitamin D  per day. If you are older than age 8, get at least 1,200 mg of calcium and at least 800 international units (IU) of vitamin D  per day. Smoking and drinking excessive alcohol increase the risk of osteoporosis. Eat foods that are rich in calcium and vitamin D , and do weight-bearing exercises several times each week as directed by your health care provider. How does menopause affect my mental health? Depression may occur at any age, but it is more common as you become older. Common symptoms of depression include: Feeling depressed. Changes in sleep patterns. Changes in appetite or eating patterns. Feeling an overall lack of motivation or enjoyment of activities that you previously enjoyed. Frequent crying spells. Talk with your health care provider if you think that you are experiencing any of these symptoms. General instructions See your health care provider for regular wellness exams and vaccines. This may include: Scheduling regular health, dental, and eye exams. Getting and maintaining your vaccines. These include: Influenza vaccine. Get this vaccine each year before the flu season begins. Pneumonia vaccine. Shingles vaccine. Tetanus, diphtheria, and pertussis (Tdap) booster vaccine. Your health care provider may also recommend other immunizations. Tell your health care provider if you have ever been abused or do not feel safe at home. Summary Menopause is a normal process in which your ability to get pregnant comes to an end. This condition causes hot flashes, night sweats, decreased interest in sex, mood swings, headaches, or lack  of sleep. Treatment for this condition may include hormone replacement therapy. Take actions to keep yourself healthy, including exercising regularly, eating a healthy diet, watching your weight, and checking your blood pressure and blood sugar levels. Get screened for cancer and depression. Make sure that you are up to date with all your vaccines. This information is not intended to replace advice given to you by your health care provider. Make sure you discuss any questions you have with your health care provider. Document Revised: 11/03/2020 Document Reviewed: 11/03/2020 Elsevier Patient Education  2024 [employer].

## 2024-03-22 ENCOUNTER — Encounter: Payer: Self-pay | Admitting: Cardiology

## 2024-03-22 ENCOUNTER — Ambulatory Visit: Payer: PRIVATE HEALTH INSURANCE | Attending: Cardiology | Admitting: Cardiology

## 2024-03-22 VITALS — BP 130/72 | HR 66 | Ht 65.0 in | Wt 129.0 lb

## 2024-03-22 DIAGNOSIS — E782 Mixed hyperlipidemia: Secondary | ICD-10-CM | POA: Diagnosis not present

## 2024-03-22 DIAGNOSIS — I700 Atherosclerosis of aorta: Secondary | ICD-10-CM | POA: Diagnosis not present

## 2024-03-22 DIAGNOSIS — I10 Essential (primary) hypertension: Secondary | ICD-10-CM

## 2024-03-22 DIAGNOSIS — I4891 Unspecified atrial fibrillation: Secondary | ICD-10-CM

## 2024-03-22 DIAGNOSIS — I480 Paroxysmal atrial fibrillation: Secondary | ICD-10-CM

## 2024-03-22 MED ORDER — METOPROLOL TARTRATE 25 MG PO TABS: ORAL_TABLET | ORAL | 3 refills | Status: AC

## 2024-03-22 NOTE — Progress Notes (Signed)
 Cardiology Office Note:  .   Date:  03/27/2024  ID:  Jill King, DOB 02-03-1961, MRN [id number] PCP: Jill King  Jill King Cardiologist:  Jill Clay, MD     Chief Complaint  Patient presents with   Follow-up    Pt doing good   Atrial Fibrillation    Currently not in A-fib now.  Relatively asymptomatic since her episodes.    Patient Profile: .     Jill King is a 63 y.o. female former smoker with a PMH notable for: PAF, HTN, HLD, COPD (chronic dyspnea who presents here for 71-month follow-up at the request of Jill King.  I last saw Jill King in August 2023 for new onset A-fib diagnosed on Zio patch monitor earlier that month.  She was in it for less than a day.  CHA2DS2-VASc score was 3-started Eliquis  5 mg twice daily.  For atypical precordial pain we ordered a coronary CTA as part of ischemic evaluation.  On bisoprolol  10 mg daily. She was seen by Cadence Franchester, PA ~ 1 month later with complaints of near syncope twice in the previous month => bisoprolol  HCTZ was converted to metoprolol  to tartrate 25 mg..  Recommended adequate hydration.   Jill King has been seen several times by APP's in the last few years.  She was most recently seen by Jill Meager, King on September 20, 2023: She is doing well.  Noting occasional fluttering sensations of palpitations but only lasting a few seconds resolving spontaneously.  She was not exercising regularly in May.  Constantly moving-Works in a production line.  Denies any intolerance to activity.  No chest pain with rest or exertion.  No unusual exertional dyspnea.  No PND, orthopnea or edema.  No syncope/near syncope or dizziness.  Subjective  Discussed the use of AI scribe software for clinical note transcription with the patient, who gave verbal consent to proceed.  History of Present Illness Jill King is a 63 year old female with atrial fibrillation who presents for a follow-up visit.  She has a  history of atrial fibrillation and has not experienced any recent episodes of dizziness, fainting, or palpitations. She recalls a previous episode where she woke up unable to breathe, which was later identified as a possible atrial fibrillation episode. She has not experienced similar episodes since then. She is currently taking metoprolol  25 mg twice daily and Eliquis  5 mg twice daily. She was previously switched from metoprolol  to another medication but experienced adverse side effects and has since returned to metoprolol .  Her last lab work was done in May, which included a cholesterol check showing an LDL of 115 mg/dL. She is taking fenofibrate  for triglyceride management. A coronary CT scan was performed. No history of diabetes, stroke, or congestive heart failure. No recent illnesses, fevers, chills, or cold sweats. No chest pain, pressure, or tightness, and no swelling in her legs or shortness of breath when lying flat.  She has a history of smoking but quit in 1990. She reports staying active and feeling well overall.     Objective   Medications: Toprol  tartrate 25 mg twice daily; apixaban  5 mg; fenofibrate  48 mg daily.  Esomeprazole 40 mg daily, and Linzess  (Linaclotide ) 290 mcg daily.  As needed albuterol .  Twice daily Breztri .  Weekly alendronate   Studies Reviewed: SABRA   EKG Interpretation Date/Time:  Thursday March 22 2024 08:25:51 EDT Ventricular Rate:  66 PR Interval:  176 QRS Duration:  94 QT Interval:  408 QTC Calculation: 427 R Axis:   59  Text Interpretation: Normal sinus rhythm Normal ECG When compared with ECG of 01-Apr-2023 15:01, No significant change was found Confirmed by Anner Lenis ([id number]) on 03/22/2024 9:05:55 AM    Labs: Lab Results  Component Value Date   NA 139 08/16/2023   K 4.1 08/16/2023   CREATININE 0.60 08/16/2023   EGFR 103 08/16/2023   GLUCOSE 93 08/16/2023   Lab Results  Component Value Date   CHOL 210 (H) 11/15/2023   HDL 70 11/15/2023    LDLCALC 115 (H) 11/15/2023   TRIG 141 11/15/2023   CHOLHDL 3.0 11/15/2023      Latest Ref Rng & Units 08/16/2023   10:59 AM 07/05/2022   11:46 AM 03/04/2022    9:13 AM  CBC  WBC 3.8 - 10.8 Thousand/uL 5.9  5.8  5.3   Hemoglobin 11.7 - 15.5 g/dL 87.3  86.8  87.1   Hematocrit 35.0 - 45.0 % 37.8  38.4  36.7   Platelets 140 - 400 Thousand/uL 306  234  220   ' Lab Results  Component Value Date   HGBA1C 5.5 08/16/2023   No results found for: TSH   Cardiac Studies: Coronary CTA (02/25/2022): Emphysema but otherwise no acute endings.  Coronary Calcium  Score 0.  No evidence of atherosclerotic plaque. ECHO (01/25/2022): Normal EF of 55 to 60%.  Normal LV size and function.  No RWMA.  Normal D FXN.  Normal RV/RV PA and RAP.  Normal valves. Zio patch (August 2023): New onset A-fib with rates of 83 to 108 bpm.  Risk Assessment/Calculations:    CHA2DS2-VASc Score = 2   This indicates a 2.2% annual risk of stroke. The patient's score is based upon: CHF History: 0 HTN History: 1 Diabetes History: 0 Stroke History: 0 Vascular Disease History: 0 Age Score: 0 Gender Score: 1          Physical Exam:   VS:  BP 130/72 (BP Location: Left Arm, Patient Position: Sitting, Cuff Size: Normal)   Pulse 66   Ht 5' 5 (1.651 m)   Wt 129 lb (58.5 kg)   SpO2 97%   BMI 21.47 kg/m    Wt Readings from Last 3 Encounters:  03/22/24 129 lb (58.5 kg)  02/15/24 129 lb (58.5 kg)  01/01/24 129 lb 13.6 oz (58.9 kg)      GEN: Well nourished, well groomed in no acute distress; healthy-appearing NECK: No JVD; No carotid bruits CARDIAC: Normal S1, S2; RRR, no murmurs, rubs, gallops RESPIRATORY:  Clear to auscultation without rales, wheezing or rhonchi ; nonlabored, good air movement. ABDOMEN: Soft, non-tender, non-distended EXTREMITIES:  No edema; No deformity      ASSESSMENT AND PLAN: .    Problem List Items Addressed This Visit       Cardiology Problems   Aortic atherosclerosis   Risk  modification with blood pressure, lipid and glycemic control.      Relevant Medications   metoprolol  tartrate (LOPRESSOR ) 25 MG tablet   HLD (hyperlipidemia) (Chronic)   Managed with fenofibrate  due to elevated triglycerides. Recent LDL 115 mg/dL. Coronary CT scan showed no significant concerns. - Continue fenofibrate  (Tricor ). - Notes myalgia statin.      Relevant Medications   metoprolol  tartrate (LOPRESSOR ) 25 MG tablet   HTN (hypertension) (Chronic)   Metoprolol  prescribed for blood pressure management. - Continue metoprolol  25 mg twice daily. - Ensure prescription refills for metoprolol  are up to date.      Relevant  Medications   metoprolol  tartrate (LOPRESSOR ) 25 MG tablet   Paroxysmal atrial fibrillation (HCC) - Primary (Chronic)   Atrial fibrillation well-managed on Eliquis  for stroke prevention and Lopressor  25 midodrine twice daily for rate control -Continue Lopressor  25 mg twice daily-okay to use additional dose of breakthrough spells of A-fib. - Continue Eliquis  5 mg twice daily. - Consider cardioversion if symptomatic atrial fibrillation recurs.      Relevant Medications   metoprolol  tartrate (LOPRESSOR ) 25 MG tablet   Other Relevant Orders   EKG 12-Lead (Completed)          Follow-Up: Return in about 1 year (around 03/22/2025) for [facility] office, Alternating annual follow-ups APP and MD.  I spent 36 minutes in the care of Jill King today including reviewing labs (1), reviewing studies (3), face to face time discussing treatment options (17), reviewing records from Previous notes (5), 10, and documenting in the encounter.     Signed, Jill MICAEL Clay, MD, MS Jill King, M.D., M.S. Interventional Cardiologist  [facility] Pager # [phone]

## 2024-03-22 NOTE — Patient Instructions (Signed)
 Medication Instructions:   Your physician recommends that you continue on your current medications as directed. Please refer to the Current Medication list given to you today.    *If you need a refill on your cardiac medications before your next appointment, please call your pharmacy*  Lab Work:  None ordered at this time   If you have labs (blood work) drawn today and your tests are completely normal, you will receive your results only by:  MyChart Message (if you have MyChart) OR  A paper copy in the mail If you have any lab test that is abnormal or we need to change your treatment, we will call you to review the results.  Testing/Procedures:  None ordered at this time   Referrals:  None ordered at this time   Follow-Up:  At [facility], you and your health needs are our priority.  As part of our continuing mission to provide you with exceptional heart care, our providers are all part of one team.  This team includes your primary Cardiologist (physician) and Advanced Practice Providers or APPs (Physician Assistants and Nurse Practitioners) who all work together to provide you with the care you need, when you need it.  Your next appointment:   1 year(s)  Provider:    Lonni Meager, NP    2 year follow up Dr. Anner  We recommend signing up for the patient portal called MyChart.  Sign up information is provided on this After Visit Summary.  MyChart is used to connect with patients for Virtual Visits (Telemedicine).  Patients are able to view lab/test results, encounter notes, upcoming appointments, etc.  Non-urgent messages can be sent to your provider as well.   To learn more about what you can do with MyChart, go to [email or web address].

## 2024-03-27 ENCOUNTER — Encounter: Payer: Self-pay | Admitting: Cardiology

## 2024-03-27 NOTE — Assessment & Plan Note (Signed)
 Managed with fenofibrate  due to elevated triglycerides. Recent LDL 115 mg/dL. Coronary CT scan showed no significant concerns. - Continue fenofibrate  (Tricor ). - Notes myalgia statin.

## 2024-03-27 NOTE — Assessment & Plan Note (Signed)
 Metoprolol  prescribed for blood pressure management. - Continue metoprolol  25 mg twice daily. - Ensure prescription refills for metoprolol  are up to date.

## 2024-03-27 NOTE — Assessment & Plan Note (Addendum)
 Risk modification with blood pressure, lipid and glycemic control.

## 2024-03-27 NOTE — Assessment & Plan Note (Signed)
 Atrial fibrillation well-managed on Eliquis  for stroke prevention and Lopressor  25 midodrine twice daily for rate control -Continue Lopressor  25 mg twice daily-okay to use additional dose of breakthrough spells of A-fib. - Continue Eliquis  5 mg twice daily. - Consider cardioversion if symptomatic atrial fibrillation recurs.

## 2024-04-24 ENCOUNTER — Telehealth: Payer: Self-pay | Admitting: Internal Medicine

## 2024-04-24 ENCOUNTER — Other Ambulatory Visit: Payer: Self-pay

## 2024-04-24 MED ORDER — LINACLOTIDE 290 MCG PO CAPS: 290.0000 ug | ORAL_CAPSULE | Freq: Every day | ORAL | 1 refills | Status: AC

## 2024-04-24 NOTE — Telephone Encounter (Signed)
 Refill sent in to pharmacy

## 2024-04-24 NOTE — Telephone Encounter (Signed)
 Copied from CRM #[id number]. Topic: Clinical - Medication Question >> Apr 24, 2024 12:35 PM Cleave MATSU wrote: Reason for CRM: pt called and said the Dr. Sharman had her on linzess  is no longer practicing so she wants to know if Dr.Baity can take over that medication and provide her refills for it

## 2024-04-24 NOTE — Telephone Encounter (Signed)
 Yes I can. Does she need refills now?

## 2024-05-08 ENCOUNTER — Other Ambulatory Visit: Payer: Self-pay | Admitting: Internal Medicine

## 2024-05-10 NOTE — Telephone Encounter (Signed)
 Requested Prescriptions  Pending Prescriptions Disp Refills   albuterol  (VENTOLIN  HFA) 108 (90 Base) MCG/ACT inhaler [Pharmacy Med Name: ALBUTEROL  SULFATE HFA 108 (90 BASE)] 8.5 g 2    Sig: INHALE 2 PUFFS INTO THE LUNGS EVERY 4-6 HOURS AS NEEDED     Pulmonology:  Beta Agonists 2 Passed - 05/10/2024 12:08 PM      Passed - Last BP in normal range    BP Readings from Last 1 Encounters:  03/22/24 130/72         Passed - Last Heart Rate in normal range    Pulse Readings from Last 1 Encounters:  03/22/24 66         Passed - Valid encounter within last 12 months    Recent Outpatient Visits           2 months ago Encounter for general adult medical examination with abnormal findings   [place] [facility] [place], Angeline ORN, NP   6 months ago Sore throat   [place], Angeline ORN, NP   8 months ago Oral thrush   [place] [facility] [place], Angeline ORN, NP   8 months ago Mixed hyperlipidemia   [place] [facility] [place], Angeline ORN, [place]

## 2024-06-04 ENCOUNTER — Other Ambulatory Visit: Payer: Self-pay | Admitting: Internal Medicine

## 2024-06-06 NOTE — Telephone Encounter (Signed)
 Requested Prescriptions  Pending Prescriptions Disp Refills   fenofibrate  (TRICOR ) 48 MG tablet [Pharmacy Med Name: FENOFIBRATE  48 MG TAB] 90 tablet 1    Sig: TAKE 1 TABLET BY MOUTH ONCE DAILY     Cardiovascular:  Antilipid - Fibric Acid Derivatives Failed - 06/06/2024  3:43 PM      Failed - Lipid Panel in normal range within the last 12 months    Cholesterol  Date Value Ref Range Status  11/15/2023 210 (H) <200 mg/dL Final   LDL Cholesterol (Calc)  Date Value Ref Range Status  11/15/2023 115 (H) mg/dL (calc) Final    Comment:    Reference range: <100 . Desirable range <100 mg/dL for primary prevention;   <70 mg/dL for patients with CHD or diabetic patients  with > or = 2 CHD risk factors. SABRA LDL-C is now calculated using the Martin-Hopkins  calculation, which is a validated novel method providing  better accuracy than the Friedewald equation in the  estimation of LDL-C.  Gladis APPLETHWAITE et al. SANDREA. 7986;689(80): 2061-2068  (http://education.[employer].com/faq/FAQ164)    HDL  Date Value Ref Range Status  11/15/2023 70 > OR = 50 mg/dL Final   Triglycerides  Date Value Ref Range Status  11/15/2023 141 <150 mg/dL Final         Passed - ALT in normal range and within 360 days    ALT  Date Value Ref Range Status  08/16/2023 16 6 - 29 U/L Final   SGPT (ALT)  Date Value Ref Range Status  03/14/2014 30 U/L Final    Comment:    14-63 NOTE: New Reference Range 01/15/14          Passed - AST in normal range and within 360 days    AST  Date Value Ref Range Status  08/16/2023 18 10 - 35 U/L Final   SGOT(AST)  Date Value Ref Range Status  03/14/2014 34 15 - 37 Unit/L Final         Passed - Cr in normal range and within 360 days    Creat  Date Value Ref Range Status  08/16/2023 0.60 0.50 - 1.05 mg/dL Final         Passed - HGB in normal range and within 360 days    Hemoglobin  Date Value Ref Range Status  08/16/2023 12.6 11.7 - 15.5 g/dL Final   HGB  Date  Value Ref Range Status  03/14/2014 13.5 12.0 - 16.0 g/dL Final         Passed - HCT in normal range and within 360 days    HCT  Date Value Ref Range Status  08/16/2023 37.8 35.0 - 45.0 % Final  03/14/2014 40.9 35.0 - 47.0 % Final         Passed - PLT in normal range and within 360 days    Platelets  Date Value Ref Range Status  08/16/2023 306 140 - 400 Thousand/uL Final   Platelet  Date Value Ref Range Status  03/14/2014 224 150 - 440 x10 3/mm 3 Final         Passed - WBC in normal range and within 360 days    WBC  Date Value Ref Range Status  08/16/2023 5.9 3.8 - 10.8 Thousand/uL Final         Passed - eGFR is 30 or above and within 360 days    EGFR (African American)  Date Value Ref Range Status  03/14/2014 >60  Final   GFR calc Af  Amer  Date Value Ref Range Status  12/18/2018 >60 >60 mL/min Final   EGFR (Non-African Amer.)  Date Value Ref Range Status  03/14/2014 >60  Final    Comment:    eGFR values <62mL/min/1.73 m2 may be an indication of chronic kidney disease (CKD). Calculated eGFR is useful in patients with stable renal function. The eGFR calculation will not be reliable in acutely ill patients when serum creatinine is changing rapidly. It is not useful in  patients on dialysis. The eGFR calculation may not be applicable to patients at the low and high extremes of body sizes, pregnant women, and vegetarians.    GFR, Estimated  Date Value Ref Range Status  07/05/2022 >60 >60 mL/min Final    Comment:    (NOTE) Calculated using the CKD-EPI Creatinine Equation (2021)    eGFR  Date Value Ref Range Status  08/16/2023 103 > OR = 60 mL/min/1.69m2 Final  10/19/2021 72 >59 mL/min/1.73 Final         Passed - Valid encounter within last 12 months    Recent Outpatient Visits           3 months ago Encounter for general adult medical examination with abnormal findings   [place] [facility] [place], Angeline ORN, NP   7 months ago Sore  throat   [place] [facility] [place], Angeline ORN, NP   9 months ago Oral thrush   [place] [facility] [place], Angeline ORN, NP   9 months ago Mixed hyperlipidemia    [facility] [place], Angeline ORN, [place]

## 2024-07-04 ENCOUNTER — Encounter: Payer: Self-pay | Admitting: Internal Medicine

## 2024-07-24 ENCOUNTER — Other Ambulatory Visit: Payer: Self-pay

## 2024-07-24 MED ORDER — BREZTRI AEROSPHERE 160-9-4.8 MCG/ACT IN AERO: 2.0000 | INHALATION_SPRAY | Freq: Two times a day (BID) | RESPIRATORY_TRACT | 5 refills | Status: AC

## 2024-08-01 ENCOUNTER — Other Ambulatory Visit: Payer: Self-pay

## 2024-08-01 DIAGNOSIS — I4891 Unspecified atrial fibrillation: Secondary | ICD-10-CM

## 2024-08-01 MED ORDER — APIXABAN 5 MG PO TABS: 5.0000 mg | ORAL_TABLET | Freq: Two times a day (BID) | ORAL | 0 refills | Status: AC

## 2024-08-01 NOTE — Telephone Encounter (Signed)
 Received a Rx request for Eliquis  for 3 month supply from [facility]. Okay to refill? Another prescribing provider on file. Thanks!

## 2024-08-03 ENCOUNTER — Other Ambulatory Visit: Payer: Self-pay | Admitting: Internal Medicine

## 2024-08-15 ENCOUNTER — Ambulatory Visit: Payer: PRIVATE HEALTH INSURANCE | Admitting: Internal Medicine
